# Patient Record
Sex: Male | Born: 1965 | Race: Black or African American | Hispanic: No | Smoking: Never smoker
Health system: Southern US, Community
[De-identification: ages and names within clinical notes are randomized; demographics above are authoritative.]

## PROBLEM LIST (undated history)

## (undated) DIAGNOSIS — E119 Type 2 diabetes mellitus without complications: Secondary | ICD-10-CM

## (undated) DIAGNOSIS — E785 Hyperlipidemia, unspecified: Secondary | ICD-10-CM

## (undated) DIAGNOSIS — D649 Anemia, unspecified: Secondary | ICD-10-CM

## (undated) DIAGNOSIS — G629 Polyneuropathy, unspecified: Secondary | ICD-10-CM

## (undated) DIAGNOSIS — I639 Cerebral infarction, unspecified: Secondary | ICD-10-CM

## (undated) DIAGNOSIS — E11319 Type 2 diabetes mellitus with unspecified diabetic retinopathy without macular edema: Secondary | ICD-10-CM

## (undated) DIAGNOSIS — I10 Essential (primary) hypertension: Secondary | ICD-10-CM

## (undated) DIAGNOSIS — N289 Disorder of kidney and ureter, unspecified: Secondary | ICD-10-CM

## (undated) DIAGNOSIS — Z992 Dependence on renal dialysis: Secondary | ICD-10-CM

## (undated) DIAGNOSIS — K859 Acute pancreatitis without necrosis or infection, unspecified: Secondary | ICD-10-CM

## (undated) DIAGNOSIS — K219 Gastro-esophageal reflux disease without esophagitis: Secondary | ICD-10-CM

## (undated) HISTORY — PX: OTHER SURGICAL HISTORY: SHX169

## (undated) HISTORY — DX: Hyperlipidemia, unspecified: E78.5

---

## 2000-05-15 ENCOUNTER — Encounter: Payer: Self-pay | Admitting: Emergency Medicine

## 2000-05-15 ENCOUNTER — Inpatient Hospital Stay (HOSPITAL_COMMUNITY): Admission: EM | Admit: 2000-05-15 | Discharge: 2000-05-18 | Payer: Self-pay

## 2002-11-19 ENCOUNTER — Other Ambulatory Visit: Payer: Self-pay

## 2002-12-17 ENCOUNTER — Other Ambulatory Visit: Payer: Self-pay

## 2004-06-07 ENCOUNTER — Emergency Department: Payer: Self-pay | Admitting: Emergency Medicine

## 2004-08-08 ENCOUNTER — Emergency Department: Payer: Self-pay | Admitting: Emergency Medicine

## 2004-08-30 ENCOUNTER — Emergency Department: Payer: Self-pay | Admitting: Emergency Medicine

## 2004-09-09 ENCOUNTER — Emergency Department: Payer: Self-pay | Admitting: Unknown Physician Specialty

## 2004-11-14 ENCOUNTER — Emergency Department: Payer: Self-pay | Admitting: Emergency Medicine

## 2004-11-15 ENCOUNTER — Inpatient Hospital Stay: Payer: Self-pay | Admitting: Internal Medicine

## 2004-11-17 ENCOUNTER — Inpatient Hospital Stay: Payer: Self-pay | Admitting: Internal Medicine

## 2005-01-15 ENCOUNTER — Emergency Department: Payer: Self-pay | Admitting: Emergency Medicine

## 2005-01-15 ENCOUNTER — Other Ambulatory Visit: Payer: Self-pay

## 2005-02-04 ENCOUNTER — Inpatient Hospital Stay: Payer: Self-pay | Admitting: Internal Medicine

## 2005-02-28 ENCOUNTER — Emergency Department: Payer: Self-pay | Admitting: Emergency Medicine

## 2005-02-28 ENCOUNTER — Other Ambulatory Visit: Payer: Self-pay

## 2005-04-04 ENCOUNTER — Emergency Department: Payer: Self-pay | Admitting: Emergency Medicine

## 2005-04-20 ENCOUNTER — Emergency Department: Payer: Self-pay | Admitting: Unknown Physician Specialty

## 2005-05-08 ENCOUNTER — Emergency Department: Payer: Self-pay | Admitting: Emergency Medicine

## 2005-05-11 ENCOUNTER — Inpatient Hospital Stay: Payer: Self-pay | Admitting: Internal Medicine

## 2005-06-19 ENCOUNTER — Inpatient Hospital Stay: Payer: Self-pay | Admitting: Internal Medicine

## 2005-06-28 ENCOUNTER — Emergency Department: Payer: Self-pay | Admitting: Internal Medicine

## 2005-07-10 ENCOUNTER — Emergency Department: Payer: Self-pay | Admitting: Unknown Physician Specialty

## 2005-07-10 ENCOUNTER — Other Ambulatory Visit: Payer: Self-pay

## 2005-07-22 ENCOUNTER — Emergency Department: Payer: Self-pay | Admitting: Emergency Medicine

## 2005-07-23 ENCOUNTER — Emergency Department: Payer: Self-pay | Admitting: Emergency Medicine

## 2005-07-23 ENCOUNTER — Other Ambulatory Visit: Payer: Self-pay

## 2005-08-08 ENCOUNTER — Emergency Department: Payer: Self-pay | Admitting: Emergency Medicine

## 2005-08-25 ENCOUNTER — Emergency Department: Payer: Self-pay | Admitting: Emergency Medicine

## 2005-09-01 ENCOUNTER — Emergency Department: Payer: Self-pay | Admitting: Emergency Medicine

## 2005-09-23 ENCOUNTER — Emergency Department: Payer: Self-pay | Admitting: Emergency Medicine

## 2005-10-04 ENCOUNTER — Emergency Department: Payer: Self-pay | Admitting: Emergency Medicine

## 2005-10-04 ENCOUNTER — Ambulatory Visit: Payer: Self-pay | Admitting: Urology

## 2005-10-06 ENCOUNTER — Ambulatory Visit: Payer: Self-pay | Admitting: Urology

## 2005-10-12 ENCOUNTER — Encounter: Payer: Self-pay | Admitting: Family Medicine

## 2005-11-02 ENCOUNTER — Encounter: Payer: Self-pay | Admitting: Family Medicine

## 2005-12-02 ENCOUNTER — Encounter: Payer: Self-pay | Admitting: Family Medicine

## 2006-04-09 ENCOUNTER — Inpatient Hospital Stay: Payer: Self-pay | Admitting: Internal Medicine

## 2006-04-09 ENCOUNTER — Other Ambulatory Visit: Payer: Self-pay

## 2006-05-08 ENCOUNTER — Emergency Department: Payer: Self-pay | Admitting: Emergency Medicine

## 2006-05-08 ENCOUNTER — Other Ambulatory Visit: Payer: Self-pay

## 2006-05-17 ENCOUNTER — Emergency Department: Payer: Self-pay | Admitting: Unknown Physician Specialty

## 2006-05-21 ENCOUNTER — Inpatient Hospital Stay: Payer: Self-pay | Admitting: Internal Medicine

## 2006-05-21 ENCOUNTER — Other Ambulatory Visit: Payer: Self-pay

## 2006-07-18 ENCOUNTER — Emergency Department: Payer: Self-pay | Admitting: Emergency Medicine

## 2006-09-11 ENCOUNTER — Emergency Department: Payer: Self-pay | Admitting: Emergency Medicine

## 2006-11-06 ENCOUNTER — Emergency Department: Payer: Self-pay | Admitting: Emergency Medicine

## 2006-11-13 ENCOUNTER — Emergency Department: Payer: Self-pay | Admitting: Internal Medicine

## 2006-11-26 ENCOUNTER — Encounter: Payer: Self-pay | Admitting: Family Medicine

## 2006-12-03 ENCOUNTER — Encounter: Payer: Self-pay | Admitting: Family Medicine

## 2007-01-21 ENCOUNTER — Emergency Department: Payer: Self-pay | Admitting: Emergency Medicine

## 2007-01-26 ENCOUNTER — Emergency Department: Payer: Self-pay | Admitting: Emergency Medicine

## 2007-03-06 ENCOUNTER — Inpatient Hospital Stay: Payer: Self-pay | Admitting: Internal Medicine

## 2007-05-15 ENCOUNTER — Emergency Department: Payer: Self-pay | Admitting: Emergency Medicine

## 2007-05-15 ENCOUNTER — Other Ambulatory Visit: Payer: Self-pay

## 2007-08-09 ENCOUNTER — Emergency Department: Payer: Self-pay | Admitting: Emergency Medicine

## 2007-08-09 ENCOUNTER — Other Ambulatory Visit: Payer: Self-pay

## 2007-09-04 ENCOUNTER — Inpatient Hospital Stay: Payer: Self-pay | Admitting: Internal Medicine

## 2007-10-15 ENCOUNTER — Inpatient Hospital Stay: Payer: Self-pay | Admitting: *Deleted

## 2007-10-28 ENCOUNTER — Emergency Department: Payer: Self-pay | Admitting: Emergency Medicine

## 2007-12-02 ENCOUNTER — Inpatient Hospital Stay: Payer: Self-pay | Admitting: Internal Medicine

## 2008-05-07 ENCOUNTER — Ambulatory Visit: Payer: Self-pay | Admitting: Ophthalmology

## 2008-05-20 ENCOUNTER — Ambulatory Visit: Payer: Self-pay | Admitting: Ophthalmology

## 2008-12-01 ENCOUNTER — Inpatient Hospital Stay: Payer: Self-pay | Admitting: Internal Medicine

## 2008-12-11 ENCOUNTER — Inpatient Hospital Stay: Payer: Self-pay | Admitting: Internal Medicine

## 2008-12-31 ENCOUNTER — Encounter: Payer: Self-pay | Admitting: Internal Medicine

## 2009-01-02 ENCOUNTER — Encounter: Payer: Self-pay | Admitting: Internal Medicine

## 2009-01-05 ENCOUNTER — Emergency Department: Payer: Self-pay | Admitting: Unknown Physician Specialty

## 2009-02-02 ENCOUNTER — Encounter: Payer: Self-pay | Admitting: Internal Medicine

## 2009-03-02 ENCOUNTER — Encounter: Payer: Self-pay | Admitting: Internal Medicine

## 2009-04-24 ENCOUNTER — Inpatient Hospital Stay: Payer: Self-pay | Admitting: Internal Medicine

## 2009-06-28 ENCOUNTER — Emergency Department: Payer: Self-pay | Admitting: Unknown Physician Specialty

## 2009-08-07 ENCOUNTER — Inpatient Hospital Stay: Payer: Self-pay | Admitting: Internal Medicine

## 2009-08-12 ENCOUNTER — Inpatient Hospital Stay: Payer: Self-pay | Admitting: Internal Medicine

## 2009-12-13 ENCOUNTER — Inpatient Hospital Stay: Payer: Self-pay | Admitting: Internal Medicine

## 2010-01-06 ENCOUNTER — Ambulatory Visit: Payer: Self-pay | Admitting: Internal Medicine

## 2010-01-12 ENCOUNTER — Ambulatory Visit: Payer: Self-pay | Admitting: Internal Medicine

## 2010-01-12 ENCOUNTER — Inpatient Hospital Stay: Payer: Self-pay | Admitting: Internal Medicine

## 2010-01-26 ENCOUNTER — Ambulatory Visit: Payer: Self-pay | Admitting: Vascular Surgery

## 2010-02-02 ENCOUNTER — Ambulatory Visit: Payer: Self-pay | Admitting: Vascular Surgery

## 2010-02-18 ENCOUNTER — Inpatient Hospital Stay: Payer: Self-pay | Admitting: Internal Medicine

## 2010-03-15 ENCOUNTER — Emergency Department: Payer: Self-pay | Admitting: Unknown Physician Specialty

## 2010-03-18 ENCOUNTER — Ambulatory Visit: Payer: Self-pay

## 2010-04-18 ENCOUNTER — Ambulatory Visit: Payer: Self-pay | Admitting: Internal Medicine

## 2010-04-23 ENCOUNTER — Inpatient Hospital Stay: Payer: Self-pay | Admitting: Internal Medicine

## 2010-05-31 ENCOUNTER — Ambulatory Visit: Payer: Self-pay | Admitting: Vascular Surgery

## 2010-08-17 ENCOUNTER — Inpatient Hospital Stay: Payer: Self-pay | Admitting: Specialist

## 2011-01-18 ENCOUNTER — Ambulatory Visit: Payer: Self-pay | Admitting: Vascular Surgery

## 2011-01-18 LAB — POTASSIUM: Potassium: 3.3 mmol/L — ABNORMAL LOW (ref 3.5–5.1)

## 2011-01-18 LAB — GLUCOSE, RANDOM
Glucose: 544 mg/dL (ref 65–99)
Glucose: 485 mg/dL — ABNORMAL HIGH (ref 65–99)

## 2011-12-13 ENCOUNTER — Ambulatory Visit: Payer: Self-pay | Admitting: Vascular Surgery

## 2012-05-24 ENCOUNTER — Observation Stay: Payer: Self-pay | Admitting: Internal Medicine

## 2012-05-24 LAB — CBC WITH DIFFERENTIAL/PLATELET
Basophil #: 0.1 10*3/uL (ref 0.0–0.1)
Basophil %: 0.9 %
Eosinophil #: 0.3 10*3/uL (ref 0.0–0.7)
Eosinophil %: 2.9 %
HCT: 36.7 % — ABNORMAL LOW (ref 40.0–52.0)
HGB: 12 g/dL — ABNORMAL LOW (ref 13.0–18.0)
Lymphocyte #: 2.8 10*3/uL (ref 1.0–3.6)
Lymphocyte %: 31.5 %
MCH: 32.5 pg (ref 26.0–34.0)
MCHC: 32.8 g/dL (ref 32.0–36.0)
MCV: 99 fL (ref 80–100)
Monocyte #: 0.6 x10 3/mm (ref 0.2–1.0)
Monocyte %: 6.7 %
Neutrophil #: 5.1 10*3/uL (ref 1.4–6.5)
Neutrophil %: 58 %
Platelet: 211 10*3/uL (ref 150–440)
RBC: 3.7 10*6/uL — ABNORMAL LOW (ref 4.40–5.90)
RDW: 14.8 % — ABNORMAL HIGH (ref 11.5–14.5)
WBC: 8.8 10*3/uL (ref 3.8–10.6)

## 2012-05-24 LAB — CK TOTAL AND CKMB (NOT AT ARMC)
CK, Total: 503 U/L — ABNORMAL HIGH (ref 35–232)
CK, Total: 516 U/L — ABNORMAL HIGH (ref 35–232)
CK-MB: 2.6 ng/mL (ref 0.5–3.6)
CK-MB: 3.1 ng/mL (ref 0.5–3.6)

## 2012-05-24 LAB — COMPREHENSIVE METABOLIC PANEL
Albumin: 3.8 g/dL (ref 3.4–5.0)
Alkaline Phosphatase: 127 U/L (ref 50–136)
Anion Gap: 8 (ref 7–16)
BUN: 12 mg/dL (ref 7–18)
Bilirubin,Total: 1 mg/dL (ref 0.2–1.0)
Calcium, Total: 9.9 mg/dL (ref 8.5–10.1)
Chloride: 99 mmol/L (ref 98–107)
Co2: 29 mmol/L (ref 21–32)
Creatinine: 8.35 mg/dL — ABNORMAL HIGH (ref 0.60–1.30)
EGFR (African American): 8 — ABNORMAL LOW
EGFR (Non-African Amer.): 7 — ABNORMAL LOW
Glucose: 166 mg/dL — ABNORMAL HIGH (ref 65–99)
Osmolality: 275 (ref 275–301)
Potassium: 4 mmol/L (ref 3.5–5.1)
SGOT(AST): 24 U/L (ref 15–37)
SGPT (ALT): 33 U/L (ref 12–78)
Sodium: 136 mmol/L (ref 136–145)
Total Protein: 8.8 g/dL — ABNORMAL HIGH (ref 6.4–8.2)

## 2012-05-24 LAB — TROPONIN I
Troponin-I: <0.02 ng/mL
Troponin-I: <0.02 ng/mL

## 2012-05-24 LAB — TSH: Thyroid Stimulating Horm: 0.69 u[IU]/mL

## 2012-05-25 LAB — BASIC METABOLIC PANEL
Anion Gap: 10 (ref 7–16)
BUN: 22 mg/dL — ABNORMAL HIGH (ref 7–18)
Calcium, Total: 9.6 mg/dL (ref 8.5–10.1)
Chloride: 100 mmol/L (ref 98–107)
Co2: 27 mmol/L (ref 21–32)
EGFR (African American): 7 — ABNORMAL LOW
EGFR (Non-African Amer.): 6 — ABNORMAL LOW
Glucose: 120 mg/dL — ABNORMAL HIGH (ref 65–99)
Osmolality: 278 (ref 275–301)
Potassium: 3.5 mmol/L (ref 3.5–5.1)
Sodium: 137 mmol/L (ref 136–145)

## 2012-05-25 LAB — CK TOTAL AND CKMB (NOT AT ARMC)
CK, Total: 369 U/L — ABNORMAL HIGH (ref 35–232)
CK-MB: 2.3 ng/mL (ref 0.5–3.6)

## 2012-05-25 LAB — CBC WITH DIFFERENTIAL/PLATELET
Basophil #: 0.1 10*3/uL (ref 0.0–0.1)
HCT: 34.8 % — ABNORMAL LOW (ref 40.0–52.0)
HGB: 11.7 g/dL — ABNORMAL LOW (ref 13.0–18.0)
Lymphocyte #: 3.6 10*3/uL (ref 1.0–3.6)
Lymphocyte %: 32.8 %
MCH: 33.2 pg (ref 26.0–34.0)
Monocyte #: 0.7 x10 3/mm (ref 0.2–1.0)
Monocyte %: 6.5 %
Neutrophil %: 57 %
Platelet: 198 10*3/uL (ref 150–440)
RBC: 3.52 10*6/uL — ABNORMAL LOW (ref 4.40–5.90)
RDW: 15 % — ABNORMAL HIGH (ref 11.5–14.5)
WBC: 11.1 10*3/uL — ABNORMAL HIGH (ref 3.8–10.6)

## 2012-05-25 LAB — PHOSPHORUS: Phosphorus: 2.3 mg/dL — ABNORMAL LOW (ref 2.5–4.9)

## 2012-05-25 LAB — LIPID PANEL
Cholesterol: 172 mg/dL (ref 0–200)
Ldl Cholesterol, Calc: 93 mg/dL (ref 0–100)
Triglycerides: 195 mg/dL (ref 0–200)

## 2012-05-25 LAB — TROPONIN I: Troponin-I: <0.02 ng/mL

## 2012-07-15 ENCOUNTER — Ambulatory Visit: Payer: Self-pay | Admitting: Nephrology

## 2013-03-07 ENCOUNTER — Inpatient Hospital Stay: Payer: Self-pay | Admitting: Internal Medicine

## 2013-03-07 LAB — CBC WITH DIFFERENTIAL/PLATELET
Basophil #: 0 10*3/uL (ref 0.0–0.1)
Basophil %: 0.4 %
EOS ABS: 0.1 10*3/uL (ref 0.0–0.7)
Eosinophil %: 0.6 %
HCT: 35.5 % — ABNORMAL LOW (ref 40.0–52.0)
HGB: 11.5 g/dL — AB (ref 13.0–18.0)
LYMPHS PCT: 7.5 %
Lymphocyte #: 0.7 10*3/uL — ABNORMAL LOW (ref 1.0–3.6)
MCH: 31.7 pg (ref 26.0–34.0)
MCHC: 32.3 g/dL (ref 32.0–36.0)
MCV: 98 fL (ref 80–100)
MONO ABS: 0.1 x10 3/mm — AB (ref 0.2–1.0)
MONOS PCT: 0.8 %
NEUTROS ABS: 8.8 10*3/uL — AB (ref 1.4–6.5)
Neutrophil %: 90.7 %
PLATELETS: 230 10*3/uL (ref 150–440)
RBC: 3.61 10*6/uL — AB (ref 4.40–5.90)
RDW: 14.6 % — ABNORMAL HIGH (ref 11.5–14.5)
WBC: 9.7 10*3/uL (ref 3.8–10.6)

## 2013-03-07 LAB — URINALYSIS, COMPLETE
BILIRUBIN, UR: NEGATIVE
KETONE: NEGATIVE
Nitrite: NEGATIVE
Ph: 8 (ref 4.5–8.0)
Protein: 100
RBC,UR: 8 /HPF (ref 0–5)
SPECIFIC GRAVITY: 1.012 (ref 1.003–1.030)
WBC UR: 140 /HPF (ref 0–5)

## 2013-03-07 LAB — BASIC METABOLIC PANEL
Anion Gap: 8 (ref 7–16)
BUN: 21 mg/dL — ABNORMAL HIGH (ref 7–18)
CREATININE: 7.16 mg/dL — AB (ref 0.60–1.30)
Calcium, Total: 8.9 mg/dL (ref 8.5–10.1)
Chloride: 96 mmol/L — ABNORMAL LOW (ref 98–107)
Co2: 29 mmol/L (ref 21–32)
EGFR (African American): 9 — ABNORMAL LOW
EGFR (Non-African Amer.): 8 — ABNORMAL LOW
Glucose: 393 mg/dL — ABNORMAL HIGH (ref 65–99)
OSMOLALITY: 286 (ref 275–301)
Potassium: 4.6 mmol/L (ref 3.5–5.1)
SODIUM: 133 mmol/L — AB (ref 136–145)

## 2013-03-07 LAB — DRUG SCREEN, URINE
AMPHETAMINES, UR SCREEN: NEGATIVE (ref ?–1000)
Barbiturates, Ur Screen: NEGATIVE (ref ?–200)
Benzodiazepine, Ur Scrn: NEGATIVE (ref ?–200)
COCAINE METABOLITE, UR ~~LOC~~: NEGATIVE (ref ?–300)
Cannabinoid 50 Ng, Ur ~~LOC~~: NEGATIVE (ref ?–50)
MDMA (ECSTASY) UR SCREEN: NEGATIVE (ref ?–500)
Methadone, Ur Screen: NEGATIVE (ref ?–300)
Opiate, Ur Screen: NEGATIVE (ref ?–300)
PHENCYCLIDINE (PCP) UR S: NEGATIVE (ref ?–25)
TRICYCLIC, UR SCREEN: NEGATIVE (ref ?–1000)

## 2013-03-07 LAB — TROPONIN I: Troponin-I: <0.02 ng/mL

## 2013-03-08 LAB — COMPREHENSIVE METABOLIC PANEL
ANION GAP: 9 (ref 7–16)
Albumin: 3.1 g/dL — ABNORMAL LOW (ref 3.4–5.0)
Alkaline Phosphatase: 127 U/L — ABNORMAL HIGH
BUN: 37 mg/dL — ABNORMAL HIGH (ref 7–18)
Bilirubin,Total: 0.4 mg/dL (ref 0.2–1.0)
CO2: 29 mmol/L (ref 21–32)
Calcium, Total: 8.7 mg/dL (ref 8.5–10.1)
Chloride: 97 mmol/L — ABNORMAL LOW (ref 98–107)
Creatinine: 9.09 mg/dL — ABNORMAL HIGH (ref 0.60–1.30)
EGFR (African American): 7 — ABNORMAL LOW
EGFR (Non-African Amer.): 6 — ABNORMAL LOW
Glucose: 228 mg/dL — ABNORMAL HIGH (ref 65–99)
Osmolality: 286 (ref 275–301)
Potassium: 4.1 mmol/L (ref 3.5–5.1)
SGOT(AST): 19 U/L (ref 15–37)
SGPT (ALT): 17 U/L (ref 12–78)
Sodium: 135 mmol/L — ABNORMAL LOW (ref 136–145)
Total Protein: 7.9 g/dL (ref 6.4–8.2)

## 2013-03-08 LAB — CBC WITH DIFFERENTIAL/PLATELET
Basophil #: 0 10*3/uL (ref 0.0–0.1)
Basophil #: 0.2 10*3/uL — ABNORMAL HIGH (ref 0.0–0.1)
Basophil %: 0.3 %
Basophil %: 1 %
EOS PCT: 0 %
Eosinophil #: 0 10*3/uL (ref 0.0–0.7)
Eosinophil #: 0 10*3/uL (ref 0.0–0.7)
Eosinophil %: 0.3 %
HCT: 34.1 % — ABNORMAL LOW (ref 40.0–52.0)
HCT: 32.9 % — ABNORMAL LOW (ref 40.0–52.0)
HGB: 11.1 g/dL — AB (ref 13.0–18.0)
HGB: 11.1 g/dL — ABNORMAL LOW (ref 13.0–18.0)
LYMPHS PCT: 13.2 %
Lymphocyte #: 2.2 10*3/uL (ref 1.0–3.6)
Lymphocyte #: 3 10*3/uL (ref 1.0–3.6)
Lymphocyte %: 19.2 %
MCH: 31.7 pg (ref 26.0–34.0)
MCH: 33 pg (ref 26.0–34.0)
MCHC: 32.4 g/dL (ref 32.0–36.0)
MCHC: 33.7 g/dL (ref 32.0–36.0)
MCV: 98 fL (ref 80–100)
MCV: 98 fL (ref 80–100)
MONO ABS: 0.7 x10 3/mm (ref 0.2–1.0)
MONOS PCT: 4.5 %
Monocyte #: 1 x10 3/mm (ref 0.2–1.0)
Monocyte %: 6 %
NEUTROS ABS: 13.6 10*3/uL — AB (ref 1.4–6.5)
NEUTROS PCT: 75 %
Neutrophil #: 11.6 10*3/uL — ABNORMAL HIGH (ref 1.4–6.5)
Neutrophil %: 80.5 %
Platelet: 240 10*3/uL (ref 150–440)
Platelet: 228 10*3/uL (ref 150–440)
RBC: 3.48 10*6/uL — ABNORMAL LOW (ref 4.40–5.90)
RBC: 3.36 10*6/uL — AB (ref 4.40–5.90)
RDW: 14.6 % — ABNORMAL HIGH (ref 11.5–14.5)
RDW: 14.5 % (ref 11.5–14.5)
WBC: 16.9 10*3/uL — AB (ref 3.8–10.6)
WBC: 15.5 10*3/uL — ABNORMAL HIGH (ref 3.8–10.6)

## 2013-03-08 LAB — LIPID PANEL
Cholesterol: 267 mg/dL — ABNORMAL HIGH (ref 0–200)
HDL Cholesterol: 51 mg/dL (ref 40–60)
Ldl Cholesterol, Calc: 182 mg/dL — ABNORMAL HIGH (ref 0–100)
TRIGLYCERIDES: 171 mg/dL (ref 0–200)
VLDL Cholesterol, Calc: 34 mg/dL (ref 5–40)

## 2013-03-08 LAB — MAGNESIUM: Magnesium: 1.7 mg/dL — ABNORMAL LOW

## 2013-03-09 LAB — MAGNESIUM: MAGNESIUM: 1.7 mg/dL — AB

## 2013-03-09 LAB — POTASSIUM: POTASSIUM: 4 mmol/L (ref 3.5–5.1)

## 2013-05-13 ENCOUNTER — Emergency Department: Payer: Self-pay | Admitting: Emergency Medicine

## 2013-05-13 LAB — BASIC METABOLIC PANEL
ANION GAP: 8 (ref 7–16)
BUN: 33 mg/dL — ABNORMAL HIGH (ref 7–18)
CALCIUM: 9.1 mg/dL (ref 8.5–10.1)
Chloride: 103 mmol/L (ref 98–107)
Co2: 28 mmol/L (ref 21–32)
Creatinine: 9.79 mg/dL — ABNORMAL HIGH (ref 0.60–1.30)
EGFR (African American): 6 — ABNORMAL LOW
EGFR (Non-African Amer.): 6 — ABNORMAL LOW
Glucose: 209 mg/dL — ABNORMAL HIGH (ref 65–99)
Osmolality: 291 (ref 275–301)
POTASSIUM: 4.4 mmol/L (ref 3.5–5.1)
SODIUM: 139 mmol/L (ref 136–145)

## 2013-05-13 LAB — TROPONIN I

## 2013-05-13 LAB — CBC
HCT: 33.5 % — AB (ref 40.0–52.0)
HGB: 10.9 g/dL — ABNORMAL LOW (ref 13.0–18.0)
MCH: 31.8 pg (ref 26.0–34.0)
MCHC: 32.5 g/dL (ref 32.0–36.0)
MCV: 98 fL (ref 80–100)
Platelet: 207 10*3/uL (ref 150–440)
RBC: 3.42 10*6/uL — AB (ref 4.40–5.90)
RDW: 13.9 % (ref 11.5–14.5)
WBC: 8 10*3/uL (ref 3.8–10.6)

## 2013-11-10 ENCOUNTER — Emergency Department: Payer: Self-pay | Admitting: Emergency Medicine

## 2013-11-10 LAB — TROPONIN I: TROPONIN-I: 0.03 ng/mL

## 2014-04-14 ENCOUNTER — Inpatient Hospital Stay
Admit: 2014-04-14 | Disposition: A | Discharge status: Home / Self Care | Payer: Self-pay | Attending: Internal Medicine | Admitting: Internal Medicine

## 2014-04-14 LAB — PHOSPHORUS: Phosphorus: 6.3 mg/dL — ABNORMAL HIGH

## 2014-04-14 LAB — TROPONIN I
Troponin-I: 0.07 ng/mL — ABNORMAL HIGH
Troponin-I: 0.07 ng/mL — ABNORMAL HIGH
Troponin-I: 0.06 ng/mL — ABNORMAL HIGH

## 2014-04-14 LAB — BASIC METABOLIC PANEL
ANION GAP: 12 (ref 7–16)
BUN: 49 mg/dL — ABNORMAL HIGH
CALCIUM: 9.6 mg/dL
Chloride: 101 mmol/L
Co2: 28 mmol/L
Creatinine: 14.42 mg/dL — ABNORMAL HIGH
GFR CALC AF AMER: 4 — AB
GFR CALC NON AF AMER: 3 — AB
Glucose: 186 mg/dL — ABNORMAL HIGH
Potassium: 3.6 mmol/L
Sodium: 141 mmol/L

## 2014-04-14 LAB — CBC
HCT: 34.8 % — ABNORMAL LOW (ref 40.0–52.0)
HGB: 11.1 g/dL — ABNORMAL LOW (ref 13.0–18.0)
MCH: 31 pg (ref 26.0–34.0)
MCHC: 32 g/dL (ref 32.0–36.0)
MCV: 97 fL (ref 80–100)
PLATELETS: 207 10*3/uL (ref 150–440)
RBC: 3.59 10*6/uL — ABNORMAL LOW (ref 4.40–5.90)
RDW: 15.9 % — AB (ref 11.5–14.5)
WBC: 8.6 10*3/uL (ref 3.8–10.6)

## 2014-04-24 NOTE — H&P (Signed)
PATIENT NAME:  Devin Richmond, MCNELLY MR#:  [id number] DATE OF BIRTH:  10/24/1965  DATE OF ADMISSION:  05/24/2012  PRIMARY CARE DOCTOR: Dr. Maryellen Pile.   ED REFERRING DOCTOR: Dr. Dorothea Glassman.  NEPHROLOGIST: Dr. Cherylann Ratel.   CHIEF COMPLAINT: Shortness of breath.   HISTORY OF PRESENT ILLNESS: The patient is a 49 year old African American male with a history of hypertension and diabetes who is on hemodialysis for end-stage renal disease. Has a history of cardiomyopathy, with an EF of 40% to 50%, who presents with complaint of having shortness of breath for the last 2 days. The patient has been shortness of breath with activity as well as rest. He was dialyzed yesterday, and he stated that he was below his dry weight. He has not had any lower extremity swelling. He denies any problems with shortness of breath with laying flat. He has not had any fevers. No coughing. No wheezing. He denies any chest pain. His main complaint is shortness of breath. He otherwise denies any abdominal pain, nausea. He denies any nausea, vomiting or diarrhea. Denies any calf swelling.   PAST MEDICAL HISTORY: 1.  History of CVAs x 3, according to the patient. He has an unstable gait.  2.  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  3.  Hypertension.  4.  Hyperlipidemia.  5.  History of gastroparesis.  6.  History of neuropathy.  7.  History of right shoulder fracture.  8.  Cardiomyopathy, with an EF of 40% to 50%.  9.  History of pancreatitis.   ALLERGIES: SULFA.   CURRENT MEDICATIONS: He is on Aggrenox 1 tablet p.o. b.i.d., amlodipine 10 daily, calcium acetate 2 capsules 3 times a day, Dialyvite 1 tablet p.o. daily, folic acid 0.4 daily, glimepiride 2 mg daily, hydralazine 50 one tab p.o. t.i.d., labetalol 200 one tab p.o. b.i.d., Lipitor  three caps 3 times, Sensipar 90 mg 1 tab p.o. daily.   SOCIAL HISTORY: No smoking. No drinking. No drugs. The patient lives with his mother. History of cocaine use.   FAMILY HISTORY:  Significant for diabetes and hypertension. Father had a history of a CVA.   REVIEW OF SYSTEMS:  CONSTITUTIONAL: Denies any fevers. Complains of some fatigue, weakness. No pain. No weight loss. No weight gain.  EYES: No blurred or double vision in the right eye. Left eye, he is blind. Denies any glaucoma.  EARS/NOSE/THROAT: No tinnitus. No ear pain. No hearing loss. No seasonal allergies. No epistaxis. No difficulty swallowing.  RESPIRATORY: Denies any cough, wheezing. No hemoptysis. Complains of dyspnea. No asthma. No painful respiration. No COPD. No TB.  CARDIOVASCULAR: Denies any chest pain, orthopnea, edema or arrhythmia.  GASTROINTESTINAL: No nausea, vomiting, diarrhea. No abdominal pain. No hematemesis. No melena.  GENITOURINARY: Denies any dysuria, hematuria, renal calc or frequency.  ENDOCRINE: Denies any polyuria, nocturia or thyroid problems.  HEMATOLOGIC/LYMPHATIC: Denies any major bruisability or bleeding.  SKIN: No acne. No rash. No changes in mole, hair or skin.  MUSCULOSKELETAL: Denies any pain in the neck, back or shoulder.  NEUROLOGIC: Has a history of CVA. Has unsteady gait. No seizures.  PSYCHIATRIC: No anxiety. No insomnia. No ADD. No OCD.   PHYSICAL EXAMINATION: VITAL SIGNS: Temperature 97.4, pulse 74, respirations 22, blood pressure 134/81, O2 of 98%.  GENERAL: The patient is a well-developed [place] American male in no acute distress.  HEAD: Normocephalic, atraumatic.  EYES: Left eye is close to being shut, which is chronic, and he has blindness in that eye. Right eye pupil is  equally round and reactive. No conjunctival pallor. Extraocular movements intact. NOSE: There is no nasal drainage or mass.  EARS: No erythema or drainage.  MOUTH: There is no exudate. No lesions.  NECK: Supple and symmetric. No masses. Thyroid midline, not enlarged. No JVD. Neck is nontender.  RESPIRATORY: Clear to auscultation, without any accessory muscle usage.  CARDIOVASCULAR: Regular rate  and rhythm. No murmurs, gallops, clicks, heaves or rubs.  GASTROINTESTINAL: There is no tenderness. No mass. No hepatosplenomegaly. Bowel sounds x4 are present.  GENITOURINARY: Deferred.  MUSCULOSKELETAL: There is no erythema or swelling. No gout.  SKIN: There is no rash.  LYMPHATICS: No lymph nodes palpable.  VASCULAR: Good DP, PT pulses.  NEUROLOGICAL: Cranial nerves II-XII grossly intact. Strength is 5/5 in all 4 extremities.  PSYCHIATRIC: Not anxious or depressed.   EVALUATION IN THE ED: EKG: Normal sinus rhythm without any ST-T wave changes. PA and lateral chest x-ray is normal. WBC 8.8, hemoglobin 12, platelet count 211. BMP: Glucose 166, BUN 12, creatinine 8.35, sodium 136, potassium 4.0, chloride 99, CO2 is 29, calcium is 9.9. LFTs are normal except total albumin of 8.8. Troponin less than 0.02. TSH 0.69.   ASSESSMENT AND PLAN: The patient is a 49 year old African American male with end-stage renal disease on hemodialysis, history of cerebrovascular accident, presents with shortness of breath for 2 days. Workup so far has been negative.   1.  Shortness of breath: No hypoxia noted on his oxygenation saturation, but he is short of breath according to him and his mom. We will go ahead and do a 2-D echocardiogram of his heart. We will go ahead and get a CT per pulmonary embolism protocol since he is already on dialysis. To rule out pulmonary embolism, I will ask Cardiology to see the patient. This could be anginal equivalent as well. Check serial cardiac enzymes; he may need further workup.  2. End-stage renal disease: Hemodialysis yesterday. I will have Nephrology to come see the patient.  3.  Diabetes: The patient on some sort of insulin; he is not sure what; his mom is to bring the insulin in, so we will resume him on insulin once that dose is available and what insulin he is on.  4. Hypertension: We will continue his regimen as taking at home, which includes labetalol, hydralazine.  5.   History of cerebrovascular accident: Continue Aggrenox as taking previously.  6.  Miscellaneous: Will put him on heparin for deep venous thrombosis prophylaxis.   NOTE: 40 minutes spent on H and P.     ____________________________ Lacie ScottsShreyang H. Allena KatzPatel, MD shp:dm D: 05/24/2012 11:34:00 ET T: 05/24/2012 11:53:38 ET JOB#: [id number]  cc: Lleyton Byers H. Allena KatzPatel, MD, <Dictator> Charise CarwinSHREYANG H Diesel Lina MD ELECTRONICALLY SIGNED 05/30/2012 14:52

## 2014-04-24 NOTE — Consult Note (Signed)
PATIENT NAME:  Gevena BarreSMITH, Kasch C MR#:  [id number] DATE OF BIRTH:  06/30/1965  DATE OF CONSULTATION:  05/24/2012  CONSULTING PHYSICIAN:  Corky DownsJaved Tobyn Osgood, MD  HISTORY OF PRESENT ILLNESS: Weston Brassric Dimercurio was admitted into the hospital with shortness of breath, trouble breathing. The patient is known to have chronic renal failure, on dialysis with a shunt in the right arm and has been getting dialysis 3 times a week. His renal failure is secondary to hypertension, and his strokes are also secondary to elevated blood pressure. The patient is also diabetic and on insulin. Echocardiogram in the past has revealed ejection fraction of 40% to 50%. Recently he complained of swelling of the extremities. Now today he is doing well, back to his baseline status. Denies any chest pain, nausea, vomiting, sweating. Denies any history of chest pain on exertion. There is no history of paroxysmal nocturnal dyspnea or orthopnea. There is no swelling of the legs. Denies any abdominal pain, fever or chills.   PAST MEDICAL HISTORY:  1.  Blindness in left eye due to diabetes. 2.  Hypertension.  3.  Dyslipidemia.  4.  Neuropathy.  5.  History of pancreatitis.   ALLERGIES: SULFA. No other known allergies.   PRESENT MEDICATIONS: As per chart.   REVIEW OF SYSTEMS: The patient has residual effect of stroke with trouble speaking. Otherwise, he was watching TV very well and denied any history of any musculoskeletal complaints.   LABORATORY, DIAGNOSTIC AND RADIOLOGICAL DATA: Electrocardiogram revealed sinus tachycardia. Chest x-ray does not show any congestive heart failure. Liver functions are normal. Troponin is normal. CPK is normal. TSH 0.69.   IMPRESSION AND RECOMMENDATIONS: The patient was evaluated for congestive heart failure and fluid overload problem. I think it is  secondary to chronic renal failure. He is not having any chest pain. His EKG does not show any acute changes. No arrhythmia is noted. I do not see any need for any  further invasive testing at the present time. Suggest to continue hemodialysis for his renal failure. Keep blood pressure under control (has elevated blood pressure) because that caused most of his problem in the past.   ____________________________ Corky DownsJaved Keontay Vora, MD jm:jm D: 05/24/2012 20:39:55 ET T: 05/24/2012 21:26:39 ET JOB#: [id number]  cc: Corky DownsJaved Obdulio Mash, MD, <Dictator> Corky DownsJAVED Annastyn Silvey MD ELECTRONICALLY SIGNED 06/17/2012 13:06

## 2014-04-24 NOTE — Discharge Summary (Signed)
PATIENT NAME:  Gevena BarreSMITH, Kazumi C MR#:  [id number] DATE OF BIRTH:  [birth year]  DATE OF ADMISSION:  05/24/2012  DATE OF DISCHARGE:  05/26/2012  ADMITTING DIAGNOSIS:  Shortness of breath.   DISCHARGE DIAGNOSES: 1.  Shortness of breath of unclear cause, now resolved, possibly due to fluid overload. The patient's CT per PE protocol was negative. Echocardiogram done, results are currently pending. The patient is currently asymptomatic.  2.  Hypotension noted during hospitalization. His blood pressure medications have been held. Blood pressure is now normal.  3.  Hypoglycemia. The patient's insulin also was held, and his blood sugars are improved. He will need to keep a log of his blood sugar, and see his primary care provider next week.  4.  End-stage renal disease.  5.  Previous history of cerebrovascular accident on Aggrenox.  6.  History of dilated cardiomyopathy, seen by Cardiology.  7.  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  8.  Hyperlipidemia.  9.  History of gastroparesis.  10.  History of neuropathy.  11.  History of right shoulder fracture in the past.  12.  History of pancreatitis in the past.   CONSULTANTS:  Dr. Edmonia JamesHameet Singh   PERTINENT LABS AND EVALUATIONS:  His CPK was elevated at 503, CK-MB was 2.6. Troponin less than 0.02. Glucose was 166, BUN 12, creatinine 8.35, sodium 136, potassium 4.0, chloride 99, CO2 was 29, calcium 9.9. LFTs showed a total protein of 8.8. WBC 8.8, hemoglobin 12, platelet count was 211. CT per PE protocol showed no CT evidence of PE, no evidence of CHF, no other pulmonary parenchymal abnormality. His EKG showed normal sinus rhythm without any ST-T wave changes. Echo result pending.   HOSPITAL COURSE:  Please refer to H and P done by me on admission. The patient is a 49 year old African-American male who presented with shortness of breath. His evaluation in the ED, including chest x-ray, was negative. The patient did not have any chest pain. He was placed  under observation for further evaluation. He had a CT per PE protocol, which failed to show any evidence of PE or any other lung parenchymal abnormality. He was seen by Cardiology. They reviewed his EKG and symptoms, and did not feel that this was related to his heart. He did have an echocardiogram, the results are currently pending. The plan was for him to be discharged next day; however, next day he was noted to be hypotensive and hypoglycemic. His antihypertensives and diabetic medications were held. With these treatments, his blood sugars improved, and his blood pressure has normalized. Currently he is doing well and is anxious to go home. He is stable for discharge. He will need to keep a log of his blood pressure and his blood glucose to take to his primary care provider next week so some of his medications can be restarted.   DISCHARGE MEDICATIONS:  Dialyvite 1 tab p.o. daily, Aggrenox 1 tab p.o. b.i.d., Lipitor 80 daily, Renvela 800 mg 3 tabs 3 times a day, ranitidine 150,  1tab p.o. b.i.d., meclizine 25, 4 times a day as needed, Sensipar 90 daily, calcium acetate 667 mg, 2 caps 3 times a day, folic acid 0.4 daily, insulin  10 units subcu at bedtime.   DIET:  Carbohydrate controlled renal diet.   ACTIVITY:  As tolerated.    FOLLOW UP:  With Dr. Maryellen PileEason next week. Hemodialysis as previously. Patient recommended to keep a log of his blood pressure and blood glucose to take to Dr. Maryellen PileEason.  Note:  32 minutes spent on the discharge.    ____________________________ Lacie Scotts. Allena Katz, MD shp:mr D: 05/26/2012 12:37:00 ET T: 05/26/2012 19:11:50 ET JOB#: [id number]  cc: Areliz Rothman H. Allena Katz, MD, <Dictator> Charise Carwin MD ELECTRONICALLY SIGNED 05/30/2012 14:53

## 2014-04-25 NOTE — H&P (Signed)
PATIENT NAME:  Devin Richmond, Devin Richmond MR#:  [id number] DATE OF BIRTH:  30-Jun-1965  DATE OF ADMISSION:  03/07/2013  PRIMARY CARE PHYSICIAN:  Dr. Toy CookeyErnest Eason   REFERRING EMERGENCY ROOM PHYSICIAN: Dr. Manson PasseyBrown  CHIEF COMPLAINT:  Syncope.   HISTORY OF PRESENT ILLNESS: The patient is a 49 year old African American male with past medical history of diabetes mellitus, history of 3 strokes, end-stage renal disease on hemodialysis on Tuesday, Thursday and Saturday, hypertension, hyperlipidemia, left eye blindness as a result of diabetes, cardiomyopathy with an ejection fraction of 40% to 50%, history of neuropathy, gastroparesis. He was brought into the ER after he sustained a syncopal episode. The patient is reporting that at around 3:30 a.m., he got out of the bed to go to bathroom. He was feeling fine and suddenly he dropped down to the floor and hit his head against the wall.  He was unconscious for a few seconds. His mom immediately saw him and called EMS. He regained consciousness spontaneously. The patient was brought into the ER via EMS. CT head was normal. He is complaining of headache. Has chronic left eye blindness. He had a similar episode of syncope several years ago and etiology was unclear at that time also. The patient has reported that his last dialysis was done on Thursday. He was evaluated with Dr. Juliann Paresallwood in the past because his blood pressure was dropping down on dialysis.  During my examination, he denies any chest pain or shortness of breath. He denies any kind of aura prior to his syncopal episode. Denies any chest pain, shortness of breath, dizziness, nausea, vomiting, diarrhea, abdominal pain. His Accu-Chek was high at around 450s in the ER.   PAST MEDICAL HISTORY: Hypertension, hyperlipidemia, diabetes mellitus, gastroparesis and neuropathy related to diabetes mellitus, history of left eye blindness from diabetes, cardiomyopathy with ejection fraction of 45% to 50%, history of 3 strokes in the  past, unstable gait, end-stage renal disease on hemodialysis on Tuesday, Thursday and Saturday.    PAST SURGICAL HISTORY:  He has AV fistula in the right forearm for dialysis.   ALLERGIES:  RADIOCONTRAST DYE, SULFA DRUGS  PSYCHOSOCIAL HISTORY: Lives with mom. History of cocaine use in the past.  No history of alcohol or illicit drug use.   FAMILY HISTORY: Diabetes mellitus and hypertension run in his family. Father had history of stroke.   HOME MEDICATIONS: Sensipar 90 mg once daily, ranitidine 150 mg 2 times a day,  mirtazapine 30 mg 1 tablet p.o. once daily, losartan 25 mg once daily, Lipitor 80 mg once daily, insulin 10 units subcutaneous once daily, calcium acetate 2 capsules p.o. 3 times a day, Aggrenox 1 capsule p.o. 2 times a day.   REVIEW OF SYSTEMS:   CONSTITUTIONAL: Denies any fever or fatigue. EYES:  Denies blurry vision, double vision, but he has a chronic left-sided blindness.  ENT: Denies epistaxis, discharge.  RESPIRATION: Denies cough, COPD.  CARDIOVASCULAR: No chest pain, palpitations. Had syncopal episode.  GASTROINTESTINAL: Denies nausea, vomiting, diarrhea and abdominal pain.  GENITOURINARY: No dysuria or hematuria.  ENDOCRINE: Denies polyuria, nocturia. Has diabetes mellitus, gastroparesis, left eye blindness and neuropathy from diabetes mellitus.  HEMATOLOGIC AND LYMPHATIC: Chronic anemia from end-stage renal disease. No easy bruising, bleeding.  INTEGUMENTARY: No acne, rash, lesions.  MUSCULOSKELETAL: No joint pain in the neck and back. Denies gout. NEUROLOGIC:  No vertigo or ataxia.  PSYCHIATRIC: No ADD, OCD.  PHYSICAL EXAMINATION:   VITAL SIGNS: Temperature 98.1, pulse 75, respirations 18, blood pressure 146/94, pulse oximetry 100%.  GENERAL APPEARANCE:  Not in acute distress. Moderately built and nourished. HEENT: Normocephalic, atraumatic. Pupils are equally reactive and accommodation. No scleral icterus. No conjunctival injection. Left eye is blind.  No  sinus tenderness. Moist mucous membranes.  NECK: Supple. No JVD. No thyromegaly. Range of motion is intact.  LUNGS: Clear to auscultation bilaterally. No accessory muscle use and no anterior chest wall tenderness on palpation.  CARDIAC: S1, S2 normal. Regular rate and rhythm. No murmurs. GASTROINTESTINAL: Soft. Bowel sounds are positive in all 4 quadrants. Nontender, nondistended. No hepatosplenomegaly. No masses.  NEUROLOGIC:  Awake, alert, oriented x 3. Motor and sensory grossly intact.  He has unsteady gait from old history of strokes. Reflexes are 2+.  EXTREMITIES: No edema. No cyanosis. No clubbing.  SKIN: Warm and normal turgor. No rashes. No lesions. Right forearm has AV fistula for dialysis.  PSYCHIATRIC: Normal mood and affect.   LABORATORY AND IMAGING STUDIES: CT head without contrast has revealed no acute intracranial findings, advanced moderate ischemia for age with multiple remote small vessel infarcts. Glucose 393, BUN 21, and creatinine 7.16, sodium 133, potassium 4.6, chloride 96, CO2 29. Anion gap 8, GFR 9, serum osmolality and calcium are normal. Troponin normal. WBC 9.7, hemoglobin 11.5, hematocrit 35.5, platelets 230. A 12-lead EKG has revealed normal sinus rhythm with tachycardia at 95 beats per minute. Normal PR and QRS interval. No acute ST-T wave changes.  ASSESSMENT AND PLAN:  1.  A 49 year old [place] American male with syncopal episode lasting for a few seconds without aura.  No seizures like activity but the fall was unwitnessed and he hit his head against the wall. The syncope could be from orthostatic blood pressures as the patient had history of hypotension during dialysis, but in the ER, blood pressure is stable. Other differential can be vasovagal versus cardiac  versus neurogenic.  Admit him to telemetry. Cycle cardiac biomarkers, check orthostatics.  2.  Echocardiogram and carotid Dopplers are ordered. Neuro checks will be obtained. Cardiology consult is placed to Dr.  Juliann Pares.  3.  End-stage renal disease on hemodialysis. Nephrology consult is placed for continuation of dialysis.  4.  History of diabetes mellitus and history of hyperglycemia. Resume his home medication Lantus and also the patient will be on sliding scale insulin.  5.  A past history of 3 cerebrovascular accidents. We will resume his Aggrenox and statin. We will provide gastrointestinal and deep vein thrombosis prophylaxis.   Plan of care discussed with the patient. He is aware of the plan.   Total time spent on admission is 50 minutes.    ____________________________ Ramonita Lab, MD ag:dp D: 03/07/2013 07:43:00 ET T: 03/07/2013 08:16:35 ET JOB#: [id number]  cc: Ramonita Lab, MD, <Dictator> Serita Sheller. Maryellen Pile, MD Ramonita Lab MD ELECTRONICALLY SIGNED 03/21/2013 0:47

## 2014-04-25 NOTE — Discharge Summary (Signed)
PATIENT NAME:  Devin Richmond, Kendrell C MR#:  [id number] DATE OF BIRTH:  05/20/1965  DATE OF ADMISSION:  03/07/2013 DATE OF DISCHARGE:  03/09/2013  PRESENTING COMPLAINT: Syncopal episode.   DISCHARGE DIAGNOSES: 1.  Syncope, resolved.  2.  Hypertension.  3.  End-stage renal disease on hemodialysis.  4.  Urinary tract infection.   CODE STATUS: FULL.  DISCHARGE MEDICATIONS: 1.  Daily Vite 1 tablet p.o. daily.  2.  Aggrenox 1 capsule b.i.d.  3.  Lipitor 80 mg p.o. daily.  4.  Ranitidine 150 mg b.i.d.  5.  Sensipar 90 mg p.o. daily.  6.  Calcium acetate 667 two capsules 3 times a day.  7.  Folic acid 0.4 mg p.o. daily.  8.  Insulin detemir 10 units at bedtime.  9.  Losartan 25 mg daily.  10.  Remeron 30 mg at bedtime.  11.  Keflex 250 p.o. b.i.d.   DISCHARGE INSTRUCTIONS:  1.  Resume your hemodialysis as before on Tuesday, Thursday, and Saturday.  2.  Follow up with Dr. Maryellen PileEason in 1 to 2 weeks.   BRIEF SUMMARY OF HOSPITAL COURSE: [name] is a 49 year old African American gentleman with past medical history of end-stage renal disease, on hemodialysis, and hypertension who comes into the Emergency Room after he had a syncopal episode at home. The patient was admitted on the telemetry floor after he had a syncopal episode at home. He was admitted with:  1.  Syncopal episode. He was admitted on the telemetry floor where his blood pressure remained stable. Orthostatic blood pressure was stable. History of hypotension during dialysis. His cardiac markers remained negative. Normal carotid Doppler echo were normal EF. The patient did not have further episodes.  2.  UTI. Will finish up a course with Keflex.  3.  End-stage renal disease, on hemodialysis. In house hemodialysis was resumed.  4.  History of diabetes. Lantus and sliding scale insulin was continued.  5.  History of CVA. The patient is on Aggrenox and statin.   Cause for syncope was likely due to mild UTI and dehydration. Symptoms resolved  prior to discharge. Hospital stay otherwise remained stable. The patient remained a FULL code.   TIME SPENT: 40 minutes. ____________________________ Wylie HailSona A. Allena KatzPatel, MD sap:sb D: 03/18/2013 13:02:09 ET T: 03/18/2013 14:11:23 ET JOB#: [id number]  cc: Meaghan Whistler A. Allena KatzPatel, MD, <Dictator> Willow OraSONA A Biruk Troia MD ELECTRONICALLY SIGNED 03/21/2013 10:41

## 2014-04-25 NOTE — Consult Note (Signed)
PATIENT NAME:  Devin Richmond, Devin Richmond DATE OF BIRTH:  26-Nov-1965  DATE OF CONSULTATION:  03/07/2013  CONSULTING PHYSICIAN:  Marcina MillardAlexander Nichole Neyer, MD  REASON FOR CONSULTATION: Syncope.  PRIMARY CARE PHYSICIAN: Serita Shellerrnest B. Maryellen PileEason, MD  HISTORY OF PRESENT ILLNESS: The patient is a 49 year old gentleman with history of diabetes, prior CVA, end-stage renal disease on chronic hemodialysis. The patient has known mild cardiomyopathy with LVEF of 40% to 50%. The patient was apparently in his usual state of health, got up to go to the restroom at 3:30 in the morning and experienced a brief syncopal episode, dropped to the floor and hit his head against a wall. The patient called EMS, was [facility] Emergency Room, where EKG was nondiagnostic. The patient was admitted to telemetry, where he has remained in sinus rhythm. Initial troponin is negative. The patient denies chest pain.   PAST MEDICAL HISTORY: 1.  End-stage renal disease, on chronic hemodialysis.  2.  Known history of CVA x 3.  3.  Hypertension.  4.  Hyperlipidemia.  5.  Diabetes. 6.  Gastroparesis. 7.  Neuropathy.  8.  History of left eye blindness. 9.  Mild cardiomyopathy with LVEF of 40% to 50%.   MEDICATIONS: Losartan 25 mg daily, Lipitor 80 mg daily, Sensipar 90 mg daily, ranitidine 150 mg b.i.d., mirtazapine 30 mg daily, insulin 10 units subcutaneous daily, calcium acetate 2 capsules t.i.d., Aggrenox 1 cap b.i.d.   SOCIAL HISTORY: The patient currently lives with his mother. The patient has had a history of prior cocaine use.   FAMILY HISTORY: No immediate family history of coronary artery disease or myocardial infarction.   REVIEW OF SYSTEMS:    CONSTITUTIONAL: No fever or chills.  EYES: No blurry vision.  EARS: No hearing loss.  RESPIRATORY: No shortness of breath.  CARDIOVASCULAR: The patient denies chest pain.  GASTROINTESTINAL: No nausea, vomiting, or diarrhea.  GENITOURINARY: No dysuria or hematuria.  ENDOCRINE:  No polyuria or polydipsia.  MUSCULOSKELETAL: No arthralgias or myalgias.  NEUROLOGICAL: No focal muscle weakness or numbness.  PSYCHOLOGICAL: No depression or anxiety.   PHYSICAL EXAMINATION: VITAL SIGNS: Blood pressure was 148/93 lying, 157/88 sitting, 159/91 standing. Pulse was 87 lying, 88 sitting, and 102 standing. Respirations 18, temperature 97.8, pulse oximetry 97%.  HEENT: Notable for left eye blindness.  NECK: Supple without thyromegaly.  LUNGS: Clear.  HEART: Normal JVP. Normal PMI. Regular rate and rhythm. Normal S1, S2. No appreciable gallop, murmur, or rub.  ABDOMEN: Soft, nontender.  EXTREMITIES: There was no cyanosis, clubbing, or edema. Pulses were intact bilaterally.  MUSCULOSKELETAL: Normal muscle tone.  NEUROLOGIC: The patient was alert and oriented, answering questions appropriately.   IMPRESSION: A 49 year old gentleman with end-stage renal disease on chronic hemodialysis, history of multiple prior strokes, who experienced a syncopal episode of unknown etiology. Initial troponin is negative. The patient has remained in sinus rhythm. Etiology appears to be unclear. Does not appear to be due to cardiac arrhythmia.   RECOMMENDATIONS: 1.  Agree with overall current therapy.  2.  Continue to monitor on telemetry.  3.  Review 2-D echocardiogram.  4.  Further recommendations pending echocardiogram results. I suspect etiology is not cardiac in nature.   ____________________________ Marcina MillardAlexander Ebba Goll, MD ap:jcm D: 03/07/2013 17:29:19 ET T: 03/07/2013 17:59:14 ET JOB#: [id number]  cc: Marcina MillardAlexander Haneefah Venturini, MD, <Dictator> Marcina MillardALEXANDER Emalyn Schou MD ELECTRONICALLY SIGNED 03/25/2013 12:42

## 2014-04-26 NOTE — Op Note (Signed)
PATIENT NAME:  Devin Richmond, Devin Richmond MR#:  [id number] DATE OF BIRTH:  Aug 10, 1965  DATE OF PROCEDURE:  01/18/2011  PREOPERATIVE DIAGNOSIS: Complication AV fistula.   POSTOPERATIVE DIAGNOSIS: Complication AV fistula.   PROCEDURE PERFORMED: Right radiocephalic fistulogram.   SURGEON: Renford DillsGregory G. Taylan Marez, MD  SEDATION: Versed 3 mg plus fentanyl 100 mcg administered IV. Continuous ECG, pulse oximetry and cardiopulmonary monitoring was performed throughout the entire procedure by the interventional radiology nurse. Total sedation time was 30 minutes.   ACCESS: 6 French sheath right arm radiocephalic fistula.   CONTRAST USED: Isovue 15 mL.   FLUORO TIME: Approximately 1 minute.   INDICATIONS: [name] presents with increasing difficulty with cannulation of his right arm fistula. Risks and benefits for contrast injection were reviewed. All questions are answered. Patient agrees to proceed.   DESCRIPTION OF PROCEDURE: Patient is taken to special procedures, placed in supine position. After adequate sedation is achieved, right arm is prepped and draped in sterile fashion. 1% lidocaine is infiltrated in the soft tissues overlying the fistula near the area of the anastomosis and a micropuncture needle is inserted without difficulty. Microwire followed by micro sheath, J-wire followed by a 6 French sheath. Hand injection of contrast is then utilized to demonstrate the fistula.   After review of the images, pursestring suture of 4-0 Monocryl was placed. Sheath was removed and there are no immediate complications.   INTERPRETATION: Fistula appears to have a moderate narrowing right at the antecubital crease. This appears to be on the order of 30% to 40%. At this point in time I do not think that angioplasty is warranted. We will continue to follow the patient with ultrasound.   ____________________________ Renford DillsGregory G. Maridel Pixler, MD ggs:cms D: 01/18/2011 08:55:37 ET T: 01/18/2011 09:22:28  ET JOB#: [id number]  cc: Renford DillsGregory G. Beza Steppe, MD, <Dictator> Renford DillsGREGORY G Zienna Ahlin MD ELECTRONICALLY SIGNED 01/21/2011 12:29

## 2014-05-03 NOTE — H&P (Signed)
PATIENT NAME:  Devin Richmond, Devin Richmond MR#:  [id number] DATE OF BIRTH:  14-Jun-1965  DATE OF ADMISSION:  04/14/2014  REFERRING PHYSICIAN: Rebecka Apley, MD  PRIMARY CARE PHYSICIAN: Nonlocal.   ADMISSION DIAGNOSES: Pulmonary edema and elevated troponin.   HISTORY OF PRESENT ILLNESS: This is a 49 year old African American male, who presents to the Emergency Department complaining shortness of breath and chest pain. The patient states that both awoke from sleep. The chest pain was nonradiating and not associated with nausea or vomiting. The patient says he feels better, but that his shortness of breath is not completely resolved. This is the first time he has ever felt this way. Notably, in the emergency department, the patient was found to be intermittently hypoxic to approximately 89% on pulse oximetry. A  chest x-ray revealed mild pulmonary edema. Notably, the patient admits that he has missed 2 of his last dialysis sessions. His daughter adds that he missed these sessions because he felt too weak to walk on prior appointment days. She is concerned that this coincides with a subjective slurring of his speech more so than usual following his previous stroke. In the Emergency Department, the patient's head was scanned and he was found to have a stroke in the cerebellum that was not previously seen a month prior. Also, the daughter reveals that the patient has been throwing away his medicines and rarely being compliant with into his medical therapies. For the above reasons, the Emergency Department staff was called for admission.   REVIEW OF SYSTEMS:  CONSTITUTIONAL: The patient denies fever, but admits to some weakness in his lower extremities. He denies falling.  HEENT: Denies tinnitus or sore throat.  EYES: Denies blurred vision or inflammation.  CHEST: Denies chest pain, palpitations, orthopnea, paroxysmal nocturnal dyspnea.  LUNGS: Admits to shortness of breath, but denies cough.  GASTROINTESTINAL:  Denies nausea, vomiting, or abdominal pain.  GENITOURINARY: Denies dysuria, increased frequency, or hesitancy of urination.  ENDOCRINE: Denies polyuria or polydipsia.  HEMATOLOGIC AND LYMPHATIC: Denies bleeding or bruising.  INTEGUMENT: Denies rashes or lesions.  MUSCULOSKELETAL: Denies arthralgias or myalgias.  NEUROLOGIC: The patient denies any dysarthria, dysphagia, weakness in his extremities or increased n-coordination. PSYCHIATRIC: Denies depression or suicidal ideation.   PAST MEDICAL HISTORY: End-stage renal disease, hypertension, diabetes type 2 and history of cerebrovascular accident.   PAST SURGICAL HISTORY: The patient is on AV fistula placement in the right forearm.   SOCIAL HISTORY: The patient lives at home. He does not smoke, drink, or do any drugs. He has an uncle that lives with him.   FAMILY HISTORY: The patient's father had hypertension in his mother is recently deceased of lung cancer.   MEDICATIONS:  1. Aggrenox 25 mg/200 mg extended release capsule 1 capsule p.o. b.i.d.  2. Carvedilol 12.5 mg 1 tablet p.o. b.i.d.  3. Dialyvite renal vitamins 1 tablet p.o. daily.  4. Folic acid 0.4 mg 1 tablet p.o. daily.  5. Furosemide 80 mg 1 tablet with once daily on nondialysis days (Mondays, Wednesdays, Fridays and Sundays).  6. Hydralazine 50 mg 2 tablets p.o. t.i.d.  7. Loratadine 10 mg 1 tablet p.o. daily.  8. Ranitidine 150 mg 1 tablet p.o. b.i.d.  9. Sensipar 60 mg 1 tablet p.o. b.i.d. with meals.   ALLERGIES: IODINATED RADIOCONTRAST DYES AND SULFA DRUGS.   PERTINENT LABORATORY RESULTS AND RADIOGRAPHIC FINDINGS: Serum glucose 186, BUN 49, creatinine 14.42, serum sodium 141, potassium 3.6, chloride 101, bicarbonate 28, calcium 9.6. Troponin is 0.07. White blood cell count 8.6,  hemoglobin 11.1, hematocrit 34.8, platelet count 207,000. Chest x-ray shows cardiopericardial enlargement and pulmonary edema. CT of the head without contrast shows a small infarct in the inferior  left cerebellum that is new compared to the previous study on 03/07/2013, but presumed to be chronic. There is also advanced small vessel disease with lacunar infarcts throughout the deep gray matter.   PHYSICAL EXAMINATION:  VITAL SIGNS: Temperature is 97.3, pulse 111, respirations 20, blood pressure 174/104, pulse oximetry is 99% on 2 liters of oxygen via nasal cannula.  GENERAL: The patient is alert and oriented x3 in no apparent distress.  HEENT: Normocephalic, atraumatic. The patient's right pupil is normal in diameter, equal, and reactive and accommodates to light. The patient's left eye is closed, as he prefers to keep that eye shut due to blindness in that eye following a stroke. Mucous membranes are moist.  NECK: Trachea is midline. No adenopathy. Thyroid nonpalpable, nontender.  CHEST: Symmetric and atraumatic.  CARDIOVASCULAR: Regular rate and rhythm. Normal S1, S2. No rubs, clicks, or murmurs appreciated.  LUNGS: Clear to auscultation bilaterally. Normal effort and excursion.  ABDOMEN: Positive bowel sounds. Soft, nontender, nondistended. No hepatosplenomegaly.  GENITOURINARY: Deferred.  MUSCULOSKELETAL: The patient moves all 4 extremities, full range of motion. Has 5% strength in upper and lower extremities bilaterally.  SKIN: Warm and dry. There are no rashes or lesions.  EXTREMITIES: No clubbing, cyanosis, or edema.  NEUROLOGIC: Cranial nerves II through XII are grossly intact with the exception of the patient's left eye, which I cannot test because he is blind in that eye. The patient does have some dysmetria on physical exam, which he states is not new and secondary only to his blindness.  PSYCHIATRIC: Mood is slightly saddened due to the recent passing of his mother. Affect is congruent. The patient seems to have fair insight and judgment into his medical condition; however, I do detect some developmental delay, which is unclear if it is secondary to previous strokes or is  symptomatic of his more recent stroke.   ASSESSMENT AND PLAN: This is a 49 year old male admitted for pulmonary edema and elevated troponin.   1. Pulmonary edema. This is likely secondary to fluid overload due to missed dialysis appointments. He does have some intermittent hypoxemia. We will consult nephrology to place the patient on dialysis. He is going to scheduled today.  2. Elevated troponin. The patient denies chest pain, but he does have inverted T waves in his septal leads on EKG. I have  consulted cardiology, although this may these findings, may represent some right heart strain following pulmonary edema due to the fluid overload mentioned above.  3. Hypertension. The patient was given his morning dose of hydralazine in the Emergency Department. This appears to have controlled his blood pressure significantly. We will restart his home medication regimen.  4. Diabetes type 2 We will add sliding scale insulin.  5. History of cerebrovascular accident. The patient does not appear to have any focal neurologic deficits at this time, but certainly something has caused his weakness and apparent slurring of speech that occurred a few days ago. At this time, I  have not consulted neurology as there does not appear to be an acute problem,  However, we may consider outpatient followup.  6. Noncompliant. We must emphasize to the patient is to take his medications, Otherwise, his condition will continue to deteriorate. 7. Deep vein thrombosis prophylaxis. Subcutaneous heparin .  8. Gastrointestinal prophylaxis, none, as the patient is not critically ill.  CODE STATUS: The patient is a full code.   TIME SPENT ON ADMISSION ORDERS AND PATIENT CARE: Approximately 45 minutes   ____________________________ Kelton Pillar. Sheryle Hail, MD msd:AT D: 04/14/2014 08:40:53 ET T: 04/14/2014 09:00:26 ET JOB#: [id number]  cc: Kelton Pillar. Sheryle Hail, MD, <Dictator> Kelton Pillar Berlinda Farve MD ELECTRONICALLY SIGNED 04/22/2014 9:43

## 2014-05-03 NOTE — Discharge Summary (Signed)
PATIENT NAME:  Devin Richmond, Devin Richmond MR#:  [id number] DATE OF BIRTH:  06-Jan-1965  DATE OF ADMISSION:  04/14/2014 DATE OF DISCHARGE:  04/15/2014  DISCHARGE DIAGNOSES: 1. Acute respiratory failure.  2. Acute on chronic diastolic congestive heart failure.  3. Elevated troponin due to congestive heart failure.  4. End-stage renal disease on hemodialysis.  5. History of cerebrovascular accident.  6. Noncompliance.   IMAGING STUDIES: Include a chest x-ray, which showed nothing acute.   DISCHARGE MEDICATIONS: Please refer to attachment medication reconciliation.   DISCHARGE INSTRUCTIONS: Home health with physical therapy has been set up. Renal diet. Activity as tolerated. Follow up with primary care physician in 1 to 2 weeks. Continue dialysis as before and be compliant with medications and dialysis.   CONSULTATIONS: Laurier NancyShaukat A. Khan, MD with cardiology, and Lennox PippinsMunsoor N. Lateef, MD , with nephrology.   ADMITTING HISTORY AND PHYSICAL: Please see detailed H and P dictated by Dr. Sheryle Hailiamond. In brief, a 49 year old African American male patient, presented to the hospital with complaints of shortness of breath and chest pain. He was found to have pulmonary edema on chest x-ray after missing dialysis, admitted to the hospitalist service.   HOSPITAL COURSE:  Acute on chronic diastolic congestive heart failure with acute respiratory failure due to missing dialysis. The patient was admitted onto the telemetry floor. The patient was seen by cardiology, who said no further invasive initiations. He had very minimal elevation of troponin, which was thought to be secondary to congestive heart failure, was seen by Dr. Welton FlakesKhan. The patient had dialysis during the hospital stay. He improved well. Initially, his saturations were 87% on room air.   By day of discharge, his saturations are 96% on room air. The patient is presently being discharged home in a stable condition with lungs sounding clear. S1, S2 heard. No edema. He has been  set up with home health PT and nursing after discharge, and been counseled to be compliant with his medications.   The time spent on day of discharge in discharge activity was 35 minutes.     ____________________________ Molinda BailiffSrikar R. Diera Wirkkala, MD srs:mw D: 04/17/2014 16:08:25 ET T: 04/17/2014 17:07:21 ET JOB#: [id number]  cc: Wardell HeathSrikar R. Jshawn Hurta, MD, <Dictator> Orie FishermanSRIKAR R Hayden Mabin MD ELECTRONICALLY SIGNED 04/27/2014 11:18

## 2014-05-03 NOTE — Consult Note (Signed)
PATIENT NAME:  Devin Richmond, [name] MR#:  [id number] DATE OF BIRTH:  09/09/1965  DATE OF CONSULTATION:  04/14/2014  REFERRING PHYSICIAN:   CONSULTING PHYSICIAN:  Laurier NancyShaukat A. Maxie Debose, MD  INDICATION FOR THE CONSULTATION: Elevated troponin.   HISTORY OF PRESENT ILLNESS: This is a 49 year old African-American male with a past medical history of hypertension, hyperlipidemia, diabetes, end-stage renal disease for the past 3 years on dialysis, presented to the Emergency Room with severe shortness of breath, orthopnea, and difficulty sleeping because of shortness of breath. Apparently he was in pulmonary edema and had elevated troponin, thus I was asked to evaluate the patient. The patient was also having desaturation with pulse oximetry showing 89% when he first came. He is feeling much better.  His shortness of breath has significantly improved.   PAST MEDICAL HISTORY: History of end-stage renal disease, hypertension, type 2 diabetes, hyperlipidemia, history of CVA, he had an AV fistula implanted in right forearm 3 years ago and has been getting dialysis.   SOCIAL HISTORY: He denies EtOH abuse or smoking.   FAMILY HISTORY: Positive for hypertension.   HOME MEDICATIONS: Carvedilol 12.5 b.i.d., Lasix 80 mg Monday, Wednesday, Friday, and Sunday, hydralazine 50 b.i.d., ranitidine 150 b.i.d.   ALLERGIES: IODINATED CONTRAST AGENTS AND SULFA DRUGS.   PHYSICAL EXAMINATION:  GENERAL: He is alert and oriented x 3, in no acute distress. He is getting dialysis right now. VITAL SIGNS:  His blood pressure is 174/104, respirations 20, pulse is 111, temperature is 97.3, saturation is 99.  HEENT: Positive JVD.  LUNGS: There are few crepitations at the bases.  HEART: Regular rate and rhythm. Normal S1, S2. No audible murmur.  ABDOMEN: Soft, nontender, positive bowel sounds.  EXTREMITIES: No pedal edema.  NEUROLOGIC: The patient appears to be intact.   LABORATORY DATA:  His EKG shows sinus tachycardia at 106 beats per  minute, left atrial enlargement, nonspecific ST-T changes. His BUN is 49, creatinine is 14.4. Troponin is 0.07, second set is also 0.07. His white count is 8.6, hemoglobin is 11.1, platelet count 207,000.   ASSESSMENT AND PLAN: The patient has only mildly elevated troponin with history of congestive heart failure, still has elevated blood pressure, he is taking good medicines, the question is whether he is compliant. Will go up on carvedilol to 25 b.i.d. and also will add ACE inhibitors, because he is getting dialysis he can tolerate ACE inhibitors. Will look at his echocardiogram to look at wall motion. He just has mildly elevated troponin. This can be evaluated for coronary artery disease as an outpatient on stress test. Right now he seems like he is not having non-STEMI. Denies any chest pain. EKG has no acute changes.    Thank you very much for the referral.      ____________________________ Laurier NancyShaukat A. Caylon Saine, MD sak:bu D: 04/14/2014 13:06:21 ET T: 04/14/2014 13:33:45 ET JOB#: [id number]  cc: Laurier NancyShaukat A. Rodarius Kichline, MD, <Dictator> Laurier NancySHAUKAT A Fabianna Keats MD ELECTRONICALLY SIGNED 04/17/2014 15:28

## 2014-05-07 ENCOUNTER — Other Ambulatory Visit: Payer: Self-pay

## 2014-05-07 ENCOUNTER — Emergency Department
Admission: EM | Admit: 2014-05-07 | Discharge: 2014-05-07 | Disposition: A | Discharge status: Home / Self Care | Payer: Medicare Other | Attending: Student | Admitting: Student

## 2014-05-07 ENCOUNTER — Encounter: Payer: Self-pay | Admitting: *Deleted

## 2014-05-07 ENCOUNTER — Emergency Department: Payer: Medicare Other

## 2014-05-07 DIAGNOSIS — Z79899 Other long term (current) drug therapy: Secondary | ICD-10-CM | POA: Diagnosis not present

## 2014-05-07 DIAGNOSIS — E119 Type 2 diabetes mellitus without complications: Secondary | ICD-10-CM | POA: Diagnosis not present

## 2014-05-07 DIAGNOSIS — R410 Disorientation, unspecified: Secondary | ICD-10-CM | POA: Insufficient documentation

## 2014-05-07 DIAGNOSIS — I10 Essential (primary) hypertension: Secondary | ICD-10-CM | POA: Diagnosis not present

## 2014-05-07 DIAGNOSIS — Z794 Long term (current) use of insulin: Secondary | ICD-10-CM | POA: Diagnosis not present

## 2014-05-07 DIAGNOSIS — Z7982 Long term (current) use of aspirin: Secondary | ICD-10-CM | POA: Insufficient documentation

## 2014-05-07 HISTORY — DX: Cerebral infarction, unspecified: I63.9

## 2014-05-07 HISTORY — DX: Essential (primary) hypertension: I10

## 2014-05-07 HISTORY — DX: Type 2 diabetes mellitus without complications: E11.9

## 2014-05-07 HISTORY — DX: Disorder of kidney and ureter, unspecified: N28.9

## 2014-05-07 LAB — CBC
HEMATOCRIT: 40.8 % (ref 40.0–52.0)
HEMOGLOBIN: 13.2 g/dL (ref 13.0–18.0)
MCH: 31.3 pg (ref 26.0–34.0)
MCHC: 32.2 g/dL (ref 32.0–36.0)
MCV: 97.3 fL (ref 80.0–100.0)
Platelets: 203 10*3/uL (ref 150–440)
RBC: 4.2 MIL/uL — ABNORMAL LOW (ref 4.40–5.90)
RDW: 16 % — ABNORMAL HIGH (ref 11.5–14.5)
WBC: 7.3 10*3/uL (ref 3.8–10.6)

## 2014-05-07 LAB — BASIC METABOLIC PANEL
ANION GAP: 10 (ref 5–15)
BUN: 11 mg/dL (ref 6–20)
CO2: 35 mmol/L — AB (ref 22–32)
CREATININE: 6.03 mg/dL — AB (ref 0.61–1.24)
Calcium: 8.6 mg/dL — ABNORMAL LOW (ref 8.9–10.3)
Chloride: 96 mmol/L — ABNORMAL LOW (ref 101–111)
GFR calc non Af Amer: 10 mL/min — ABNORMAL LOW (ref >60)
GFR, EST AFRICAN AMERICAN: 11 mL/min — AB (ref >60)
Glucose, Bld: 138 mg/dL — ABNORMAL HIGH (ref 65–99)
Potassium: 3.4 mmol/L — ABNORMAL LOW (ref 3.5–5.1)
Sodium: 141 mmol/L (ref 135–145)

## 2014-05-07 NOTE — ED Notes (Signed)
Pt arrives via EMS with complaints of "not feeling well", and "I cant remember anything", pt dialysis with access R forearm, pt had tx today and states the symptoms started after his tx, pt denies any pain, pt has hx oc CVA with left sided deficits and blindness in left eye

## 2014-05-07 NOTE — ED Provider Notes (Signed)
[facility] Emergency Department Provider Note    ____________________________________________  Time seen: ----------------------------------------- 3:40 PM on 05/07/2014 -----------------------------------------    I have reviewed the triage vital signs and the nursing notes.   HISTORY  Chief Complaint Weakness and Memory Loss       HPI Devin Richmond is a 49 y.o. male with past medical history significant for end-stage renal disease on dialysis, history of prior CVA with mild left-sided deficit, diabetes, hyperlipidemia presents for evaluation of transient confusion. Patient reports that he received his full dialysis treatment today after which he be began feeling confused, he was unable to remember where he was or how he got home from dialysis. This has resolved. He now remembers how he got home and knows where he is. He denies any associated chest pain, trouble breathing, new numbness or weakness or any speech difficulty. Nothing makes it better or worse.  Location: neurological, generalized Duration: just after dialysis earlier today, now resolved Timing: gradual Severity: Maximal severity +, current severity 0 out of 10    Past Medical History  Diagnosis Date  . CVA (cerebral infarction)   . Diabetes mellitus without complication   . Hypertension   . Kidney disease     There are no active problems to display for this patient.   History reviewed. No pertinent past surgical history.  Current Outpatient Rx  Name  Route  Sig  Dispense  Refill  . aspirin 81 MG tablet   Oral   Take 81 mg by mouth daily.         Marland Kitchen. atorvastatin (LIPITOR) 80 MG tablet   Oral   Take 80 mg by mouth daily.         . carvedilol (COREG) 3.125 MG tablet   Oral   Take 3.125 mg by mouth 2 (two) times daily with a meal.         . cinacalcet (SENSIPAR) 90 MG tablet   Oral   Take 90 mg by mouth daily.         Marland Kitchen. dipyridamole-aspirin (AGGRENOX)  200-25 MG per 12 hr capsule   Oral   Take 1 capsule by mouth 2 (two) times daily.         . folic acid (FOLVITE) 400 MCG tablet   Oral   Take 400 mcg by mouth daily.         . hydrALAZINE (APRESOLINE) 100 MG tablet   Oral   Take 100 mg by mouth 3 (three) times daily.         . insulin aspart (NOVOLOG) 100 UNIT/ML injection   Subcutaneous   Inject 7 Units into the skin 3 (three) times daily before meals.         . insulin detemir (LEVEMIR) 100 UNIT/ML injection   Subcutaneous   Inject 25 Units into the skin at bedtime.         Marland Kitchen. lanthanum (FOSRENOL) 1000 MG chewable tablet   Oral   Chew 1,000 mg by mouth 3 (three) times daily with meals.         Marland Kitchen. loratadine (CLARITIN) 10 MG tablet   Oral   Take 10 mg by mouth daily.         . meclizine (ANTIVERT) 25 MG tablet   Oral   Take 25 mg by mouth 2 (two) times daily.         . mirtazapine (REMERON) 30 MG tablet   Oral   Take 30 mg by mouth at  bedtime.         . Multiple Vitamin (MULTIVITAMIN WITH MINERALS) TABS tablet   Oral   Take 1 tablet by mouth daily.         . ranitidine (ZANTAC) 150 MG capsule   Oral   Take 150 mg by mouth every evening.           Allergies Sulfa antibiotics  History reviewed. No pertinent family history.  Social History History  Substance Use Topics  . Smoking status: Never Smoker   . Smokeless tobacco: Not on file  . Alcohol Use: No    Review of Systems  Constitutional: Negative for fever. Eyes: Negative for visual changes. +chronic left eye vision loss ENT: Negative for sore throat. Cardiovascular: Negative for chest pain. Respiratory: Negative for shortness of breath. Gastrointestinal: Negative for abdominal pain, vomiting and diarrhea. Genitourinary: Negative for dysuria. Musculoskeletal: Negative for back pain. Skin: Negative for rash. Neurological: Negative for headaches, negative for new focal weakness or numbness.   10-point ROS otherwise  negative.  ____________________________________________   PHYSICAL EXAM:  VITAL SIGNS: ED Triage Vitals  Enc Vitals Group     BP 05/07/14 1320 137/85 mmHg     Pulse Rate 05/07/14 1320 80     Resp 05/07/14 1320 20     Temp 05/07/14 1320 98 F (36.7 C)     Temp Source 05/07/14 1320 Oral     SpO2 05/07/14 1320 99 %     Weight 05/07/14 1320 199 lb (90.266 kg)     Height 05/07/14 1320 6' (1.829 m)     Head Cir --      Peak Flow --      Pain Score --      Pain Loc --      Pain Edu? --      Excl. in GC? --      Constitutional: Alert and oriented x4. Well appearing and in no distress. Eyes: Conjunctivae are normal. Normal pupil right eye; chronic abnormality/disfigurement of the left eye ENT   Head: Normocephalic and atraumatic.   Nose: No congestion/rhinnorhea.   Mouth/Throat: Mucous membranes are moist.   Neck: No stridor. Hematological/Lymphatic/Immunilogical: No cervical lymphadenopathy. Cardiovascular: Normal rate, regular rhythm. Normal and symmetric distal pulses are present in all extremities. No murmurs, rubs, or gallops. Respiratory: Normal respiratory effort without tachypnea nor retractions. Breath sounds are clear and equal bilaterally. No wheezes/rales/rhonchi. Gastrointestinal: Soft and nontender. No distention. No abdominal bruits. There is no CVA tenderness. Genitourinary: deferred Musculoskeletal: Nontender with normal range of motion in all extremities. No joint effusions.  No lower extremity tenderness nor edema. Neurologic:  Normal speech and language. No gross focal neurologic deficits are appreciated. Speech is normal. No aphasia, 5 out of 5 strength in bilateral upper and lower extremities, cranial nerves II through XII intact Skin:  Skin is warm, dry and intact. No rash noted. Psychiatric: Mood and affect are normal. Speech and behavior are normal. Patient exhibits appropriate insight and  judgment.  ____________________________________________    LABS (pertinent positives/negatives)  Elevated creatinine (as expected in ESRD) but generally unremarkable otherwise.  ____________________________________________   EKG  ED ECG REPORT   Date: 05/07/2014  EKG Time: 1326  Rate: 81  Rhythm: Sinus rhythm with occasional PVCs/fusion complexes  Axis: Normal  Intervals:none  ST&T Change: No acute ST segment elevation   ____________________________________________    RADIOLOGY  EXAM: CT HEAD WITHOUT CONTRAST  TECHNIQUE: Contiguous axial images were obtained from the base of the skull through the  vertex without intravenous contrast.  COMPARISON: 04/14/2014  FINDINGS: No skull fracture is noted. Paranasal sinuses and mastoid air cells are unremarkable. No intracranial hemorrhage, mass effect or midline shift.  Again noted atrophy and extensive chronic white matter disease. Bilateral multiple lacunar infarcts are again noted. No definite evidence of acute cortical infarction. No mass lesion is noted on this unenhanced scan. Stable chronic small infarct in left cerebellum.  IMPRESSION: No acute intracranial abnormality. Stable atrophy and chronic white matter disease. Stable chronic findings as described above.   Electronically Signed By: Natasha MeadLiviu Pop M.D. On: 05/07/2014 15:48  ____________________________________________   PROCEDURES  Procedure(s) performed: None  Critical Care performed: No  ____________________________________________   INITIAL IMPRESSION / ASSESSMENT AND PLAN / ED COURSE  Pertinent labs & imaging results that were available during my care of the patient were reviewed by me and considered in my medical decision making (see chart for details).  Devin Richmond is a 49 y.o. male with past medical history significant for end-stage renal disease on dialysis, history of prior CVA with mild left-sided deficit, diabetes,  hyperlipidemia presents for evaluation of transient confusion, now resolved. On exam, he is well-appearing and in no acute distress, vital signs stable. He is alert and oriented 4, has an intact neuro exam for me. Suspect transient confusion in the setting of electrolyte/fluid shift after dialysis.  ----------------------------------------- 4:26 PM on 05/07/2014 -----------------------------------------  Labs generally unremarkable. CT head with chronic findings. Patient appears well. Still A&Ox4. DC home with return precautions and close PCP follow-up.  ____________________________________________   FINAL CLINICAL IMPRESSION(S) / ED DIAGNOSES  Final diagnoses:  Confusion with non-focal neuro exam     Gayla DossEryka A Miguel Christiana, MD 05/07/14 [phone]

## 2014-05-07 NOTE — Discharge Instructions (Signed)
Confusion °Confusion is the inability to think with your usual speed or clarity. Confusion may come on quickly or slowly over time. How quickly the confusion comes on depends on the cause. Confusion can be due to any number of causes. °CAUSES  °· Concussion, head injury, or head trauma. °· Seizures. °· Stroke. °· Fever. °· Brain tumor. °· Age related decreased brain function (dementia). °· Heightened emotional states like rage or terror. °· Mental illness in which the person loses the ability to determine what is real and what is not (hallucinations). °· Infections such as a urinary tract infection (UTI). °· Toxic effects from alcohol, drugs, or prescription medicines. °· Dehydration and an imbalance of salts in the body (electrolytes). °· Lack of sleep. °· Low blood sugar (diabetes). °· Low levels of oxygen from conditions such as chronic lung disorders. °· Drug interactions or other medicine side effects. °· Nutritional deficiencies, especially niacin, thiamine, vitamin C, or vitamin B. °· Sudden drop in body temperature (hypothermia). °· Change in routine, such as when traveling or hospitalized. °SIGNS AND SYMPTOMS  °People often describe their thinking as cloudy or unclear when they are confused. Confusion can also include feeling disoriented. That means you are unaware of where or who you are. You may also not know what the date or time is. If confused, you may also have difficulty paying attention, remembering, and making decisions. Some people also act aggressively when they are confused.  °DIAGNOSIS  °The medical evaluation of confusion may include: °· Blood and urine tests. °· X-rays. °· Brain and nervous system tests. °· Analyzing your brain waves (electroencephalogram or EEG). °· Magnetic resonance imaging (MRI) of your head. °· Computed tomography (CT) scan of your head. °· Mental status tests in which your health care provider may ask many questions. Some of these questions may seem silly or strange,  but they are a very important test to help diagnose and treat confusion. °TREATMENT  °An admission to the hospital may not be needed, but a person with confusion should not be left alone. Stay with a family member or friend until the confusion clears. Avoid alcohol, pain relievers, or sedative drugs until you have fully recovered. Do not drive until directed by your health care provider. °HOME CARE INSTRUCTIONS  °What family and friends can do: °· To find out if someone is confused, ask the person to state his or her name, age, and the date. If the person is unsure or answers incorrectly, he or she is confused. °· Always introduce yourself, no matter how well the person knows you. °· Often remind the person of his or her location. °· Place a calendar and clock near the confused person. °· Help the person with his or her medicines. You may want to use a pill box, an alarm as a reminder, or give the person each dose as prescribed. °· Talk about current events and plans for the day. °· Try to keep the environment calm, quiet, and peaceful. °· Make sure the person keeps follow-up visits with his or her health care provider. °PREVENTION  °Ways to prevent confusion: °· Avoid alcohol. °· Eat a balanced diet. °· Get enough sleep. °· Take medicine only as directed by your health care provider. °· Do not become isolated. Spend time with other people and make plans for your days. °· Keep careful watch on your blood sugar levels if you are diabetic. °SEEK IMMEDIATE MEDICAL CARE IF:  °· You develop severe headaches, repeated vomiting, seizures, blackouts, or   slurred speech. °· There is increasing confusion, weakness, numbness, restlessness, or personality changes. °· You develop a loss of balance, have marked dizziness, feel uncoordinated, or fall. °· You have delusions, hallucinations, or develop severe anxiety. °· Your family members think you need to be rechecked. °Document Released: 01/27/2004 Document Revised: 05/05/2013  Document Reviewed: 01/24/2013 °ExitCare® Patient Information ©2015 [employer]. This information is not intended to replace advice given to you by your health care provider. Make sure you discuss any questions you have with your health care provider. ° °Altered Mental Status °Altered mental status most often refers to an abnormal change in your responsiveness and awareness. It can affect your speech, thought, mobility, memory, attention span, or alertness. It can range from slight confusion to complete unresponsiveness (coma). Altered mental status can be a sign of a serious underlying medical condition. Rapid evaluation and medical treatment is necessary for patients having an altered mental status. °CAUSES  °· Low blood sugar (hypoglycemia) or diabetes. °· Severe loss of body fluids (dehydration) or a body salt (electrolyte) imbalance. °· A stroke or other neurologic problem, such as dementia or delirium. °· A head injury or tumor. °· A drug or alcohol overdose. °· Exposure to toxins or poisons. °· Depression, anxiety, and stress. °· A low oxygen level (hypoxia). °· An infection. °· Blood loss. °· Twitching or shaking (seizure). °· Heart problems, such as heart attack or heart rhythm problems (arrhythmias). °· A body temperature that is too low or too high (hypothermia or hyperthermia). °DIAGNOSIS  °A diagnosis is based on your history, symptoms, physical and neurologic examinations, and diagnostic tests. Diagnostic tests may include: °· Measurement of your blood pressure, pulse, breathing, and oxygen levels (vital signs). °· Blood tests. °· Urine tests. °· X-ray exams. °· A computerized magnetic scan (magnetic resonance imaging, MRI). °· A computerized X-ray scan (computed tomography, CT scan). °TREATMENT  °Treatment will depend on the cause. Treatment may include: °· Management of an underlying medical or mental health condition. °· Critical care or support in the hospital. °HOME CARE INSTRUCTIONS  °· Only  take over-the-counter or prescription medicines for pain, discomfort, or fever as directed by your caregiver. °· Manage underlying conditions as directed by your caregiver. °· Eat a healthy, well-balanced diet to maintain strength. °· Join a support group or prevention program to cope with the condition or trauma that caused the altered mental status. Ask your caregiver to help choose a program that works for you. °· Follow up with your caregiver for further examination, therapy, or testing as directed. °SEEK MEDICAL CARE IF:  °· You feel unwell or have chills. °· You or your family notice a change in your behavior or your alertness. °· You have trouble following your caregiver's treatment plan. °· You have questions or concerns. °SEEK IMMEDIATE MEDICAL CARE IF:  °· You have a rapid heartbeat or have chest pain. °· You have difficulty breathing. °· You have a fever. °· You have a headache with a stiff neck. °· You cough up blood. °· You have blood in your urine or stool. °· You have severe agitation or confusion. °MAKE SURE YOU:  °· Understand these instructions. °· Will watch your condition. °· Will get help right away if you are not doing well or get worse. °Document Released: 06/08/2009 Document Revised: 03/13/2011 Document Reviewed: 06/08/2009 °ExitCare® Patient Information ©2015 [employer]. This information is not intended to replace advice given to you by your health care provider. Make sure you discuss any questions you have   with your health care provider. ° °

## 2014-07-20 ENCOUNTER — Encounter: Payer: Self-pay | Admitting: *Deleted

## 2014-07-20 ENCOUNTER — Ambulatory Visit
Admission: RE | Admit: 2014-07-20 | Discharge: 2014-07-20 | Disposition: A | Discharge status: Home / Self Care | Payer: Medicare Other | Source: Ambulatory Visit | Attending: Vascular Surgery | Admitting: Vascular Surgery

## 2014-07-20 ENCOUNTER — Encounter
Admission: RE | Disposition: A | Discharge status: Home / Self Care | Payer: Self-pay | Source: Ambulatory Visit | Attending: Vascular Surgery

## 2014-07-20 DIAGNOSIS — Z79899 Other long term (current) drug therapy: Secondary | ICD-10-CM | POA: Diagnosis not present

## 2014-07-20 DIAGNOSIS — K219 Gastro-esophageal reflux disease without esophagitis: Secondary | ICD-10-CM | POA: Diagnosis not present

## 2014-07-20 DIAGNOSIS — I120 Hypertensive chronic kidney disease with stage 5 chronic kidney disease or end stage renal disease: Secondary | ICD-10-CM | POA: Insufficient documentation

## 2014-07-20 DIAGNOSIS — Z992 Dependence on renal dialysis: Secondary | ICD-10-CM | POA: Insufficient documentation

## 2014-07-20 DIAGNOSIS — E1122 Type 2 diabetes mellitus with diabetic chronic kidney disease: Secondary | ICD-10-CM | POA: Insufficient documentation

## 2014-07-20 DIAGNOSIS — E11319 Type 2 diabetes mellitus with unspecified diabetic retinopathy without macular edema: Secondary | ICD-10-CM | POA: Insufficient documentation

## 2014-07-20 DIAGNOSIS — K859 Acute pancreatitis, unspecified: Secondary | ICD-10-CM | POA: Insufficient documentation

## 2014-07-20 DIAGNOSIS — N186 End stage renal disease: Secondary | ICD-10-CM | POA: Diagnosis not present

## 2014-07-20 DIAGNOSIS — D649 Anemia, unspecified: Secondary | ICD-10-CM | POA: Insufficient documentation

## 2014-07-20 DIAGNOSIS — T82858A Stenosis of vascular prosthetic devices, implants and grafts, initial encounter: Secondary | ICD-10-CM | POA: Insufficient documentation

## 2014-07-20 DIAGNOSIS — Z8673 Personal history of transient ischemic attack (TIA), and cerebral infarction without residual deficits: Secondary | ICD-10-CM | POA: Diagnosis not present

## 2014-07-20 HISTORY — DX: Type 2 diabetes mellitus with unspecified diabetic retinopathy without macular edema: E11.319

## 2014-07-20 HISTORY — DX: Anemia, unspecified: D64.9

## 2014-07-20 HISTORY — PX: PERIPHERAL VASCULAR CATHETERIZATION: SHX172C

## 2014-07-20 HISTORY — DX: Polyneuropathy, unspecified: G62.9

## 2014-07-20 HISTORY — DX: Cerebral infarction, unspecified: I63.9

## 2014-07-20 HISTORY — DX: Gastro-esophageal reflux disease without esophagitis: K21.9

## 2014-07-20 HISTORY — DX: Acute pancreatitis without necrosis or infection, unspecified: K85.90

## 2014-07-20 LAB — POTASSIUM (ARMC VASCULAR LAB ONLY): POTASSIUM (ARMC VASCULAR LAB): 4.8

## 2014-07-20 LAB — GLUCOSE, CAPILLARY: GLUCOSE-CAPILLARY: 162 mg/dL — AB (ref 65–99)

## 2014-07-20 SURGERY — A/V SHUNTOGRAM/FISTULAGRAM
Anesthesia: Moderate Sedation | Laterality: Right

## 2014-07-20 MED ORDER — IOHEXOL 300 MG/ML  SOLN
INTRAMUSCULAR | Status: DC | PRN
Start: 2014-07-20 — End: 2014-07-20
  Administered 2014-07-20: 35 mL via INTRA_ARTERIAL

## 2014-07-20 MED ORDER — FENTANYL CITRATE (PF) 100 MCG/2ML IJ SOLN
INTRAMUSCULAR | Status: AC
  Filled 2014-07-20: qty 2

## 2014-07-20 MED ORDER — HEPARIN SODIUM (PORCINE) 1000 UNIT/ML IJ SOLN
INTRAMUSCULAR | Status: DC | PRN
  Administered 2014-07-20: 3000 [IU] via INTRAVENOUS

## 2014-07-20 MED ORDER — FENTANYL CITRATE (PF) 100 MCG/2ML IJ SOLN
INTRAMUSCULAR | Status: DC | PRN
  Administered 2014-07-20 (×2): 50 ug via INTRAVENOUS

## 2014-07-20 MED ORDER — MIDAZOLAM HCL 2 MG/2ML IJ SOLN
INTRAMUSCULAR | Status: DC | PRN
  Administered 2014-07-20: 1 mg via INTRAVENOUS
  Administered 2014-07-20: 2 mg via INTRAVENOUS

## 2014-07-20 MED ORDER — HEPARIN (PORCINE) IN NACL 2-0.9 UNIT/ML-% IJ SOLN
INTRAMUSCULAR | Status: AC
  Filled 2014-07-20: qty 1000

## 2014-07-20 MED ORDER — HEPARIN SODIUM (PORCINE) 1000 UNIT/ML IJ SOLN
INTRAMUSCULAR | Status: AC
  Filled 2014-07-20: qty 1

## 2014-07-20 MED ORDER — CEFAZOLIN SODIUM 1-5 GM-% IV SOLN
INTRAVENOUS | Status: AC
  Filled 2014-07-20: qty 50

## 2014-07-20 MED ORDER — LIDOCAINE HCL (PF) 1 % IJ SOLN
INTRAMUSCULAR | Status: AC
  Filled 2014-07-20: qty 5

## 2014-07-20 MED ORDER — CEFAZOLIN SODIUM 1-5 GM-% IV SOLN
1.0000 g | Freq: Once | INTRAVENOUS | Status: AC
  Administered 2014-07-20: 1 g via INTRAVENOUS

## 2014-07-20 MED ORDER — LIDOCAINE-EPINEPHRINE (PF) 1 %-1:200000 IJ SOLN
INTRAMUSCULAR | Status: DC | PRN
  Administered 2014-07-20: 10 mL via INTRADERMAL

## 2014-07-20 MED ORDER — SODIUM CHLORIDE 0.9 % IV SOLN
INTRAVENOUS | Status: DC
  Administered 2014-07-20: 10:00:00 via INTRAVENOUS

## 2014-07-20 MED ORDER — MIDAZOLAM HCL 5 MG/5ML IJ SOLN
INTRAMUSCULAR | Status: AC
  Filled 2014-07-20: qty 5

## 2014-07-20 SURGICAL SUPPLY — 13 items
BALLN DORADO 8X40X80 (BALLOONS) ×4
BALLN LUTONIX DCB 7X60X130 (BALLOONS) ×4
BALLOON DORADO 8X40X80 (BALLOONS) ×2 IMPLANT
BALLOON LUTONIX DCB 7X60X130 (BALLOONS) ×2 IMPLANT
CANNULA 5F STIFF (CANNULA) ×4 IMPLANT
CATH KUMPE (CATHETERS) ×2
CATH SLIP 5FR .038X65 KMP (CATHETERS) ×2
CATH SLIP 5FR 0.38 X 40 KMP (CATHETERS) ×2 IMPLANT
DRAPE BRACHIAL (DRAPES) ×4 IMPLANT
PACK ANGIOGRAPHY (CUSTOM PROCEDURE TRAY) ×4 IMPLANT
SHEATH BRITE TIP 6FRX5.5 (SHEATH) ×4 IMPLANT
TOWEL OR 17X26 4PK STRL BLUE (TOWEL DISPOSABLE) ×4 IMPLANT
WIRE MAGIC TORQUE 260C (WIRE) ×4 IMPLANT

## 2014-07-20 NOTE — Discharge Instructions (Signed)
Fistulogram, Care After °Refer to this sheet in the next few weeks. These instructions provide you with information on caring for yourself after your procedure. Your health care provider may also give you more specific instructions. Your treatment has been planned according to current medical practices, but problems sometimes occur. Call your health care provider if you have any problems or questions after your procedure. °WHAT TO EXPECT AFTER THE PROCEDURE °After your procedure, it is typical to have the following: °· A small amount of discomfort in the area where the catheters were placed. °· A small amount of bruising around the fistula. °· Sleepiness and fatigue. °HOME CARE INSTRUCTIONS °· Rest at home for the day following your procedure. °· Do not drive or operate heavy machinery while taking pain medicine. °· Take medicines only as directed by your health care provider. °· Do not take baths, swim, or use a hot tub until your health care provider approves. You may shower 24 hours after the procedure or as directed by your health care provider. °· There are many different ways to close and cover an incision, including stitches, skin glue, and adhesive strips. Follow your health care provider's instructions on: °¨ Incision care. °¨ Bandage (dressing) changes and removal. °¨ Incision closure removal. °· Monitor your dialysis fistula carefully. °SEEK MEDICAL CARE IF: °· You have drainage, redness, swelling, or pain at your catheter site. °· You have a fever. °· You have chills. °SEEK IMMEDIATE MEDICAL CARE IF: °· You feel weak. °· You have trouble balancing. °· You have trouble moving your arms or legs. °· You have problems with your speech or vision. °· You can no longer feel a vibration or buzz when you put your fingers over your dialysis fistula. °· The limb that was used for the procedure: °¨ Swells. °¨ Is painful. °¨ Is cold. °¨ Is discolored, such as blue or pale white. °Document Released: 05/05/2013  Document Reviewed: 02/07/2013 °Exit[employer]® Patient Information ©2015 [employer]. This information is not intended to replace advice given to you by your health care provider. Make sure you discuss any questions you have with your health care provider. ° °

## 2014-07-20 NOTE — CV Procedure (Signed)
[facility]    OPERATIVE NOTE   PROCEDURE: 1.   Right radiocephalic arteriovenous fistula cannulation under ultrasound guidance 2.   Right arm fistulagram including central venogram 3.   Percutaneous transluminal angioplasty of the median antecubital vein and basilic vein at the elbow with 7 mm diameter drug-coated angioplasty balloon and 8 mm diameter high pressure angioplasty balloon  PRE-OPERATIVE DIAGNOSIS: 1. ESRD 2. 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 male who presents with malfunctioning  right radiocephalic arteriovenous fistula.  The patient is scheduled for  right arm fistulagram.  The patient is aware the risks include but are not limited to: bleeding, infection, thrombosis of the cannulated access, and possible anaphylactic reaction to the contrast.  The patient is aware of the risks of the procedure and elects to proceed forward.  DESCRIPTION: After full informed written consent was obtained, the patient was brought back to the angiography suite and placed supine upon the angiography table.  The patient was connected to monitoring equipment.  The  right arm was prepped and draped in the standard fashion for a percutaneous access intervention.  Under ultrasound guidance, the  right radiocephalic arteriovenous fistula was cannulated with a micropuncture needle under direct ultrasound guidance and a permanent image was performed.  The microwire was advanced into the fistula and the needle was exchanged for the a microsheath.  I then upsized to a 6 Fr Sheath and imaging was  performed.  Hand injections were completed to image the access including the central venous system. This demonstrated the upper arm outflow to be through the basilic vein and the median antecubital vein draining the fistula at the elbow had about a 75-80% stenosis. It also appeared somewhat narrow between aneurysmal access segments, but it was difficult to discern whether or not this was just a relatively smaller vessel compared to the aneurysmal segments.  Based on the images, this patient will need treatment of the median antecubital vein to improve outflow. I then gave the patient 3000 units of intravenous heparin.  I then crossed the stenosis with a Magic Tourqe wire.  Based on the imaging, a 7 mm x 6 cm  Lutonix drug-coated angioplasty balloon was selected.  The balloon was centered around the median antecubital vein and basilic vein stenosis and inflated to 12 ATM for 1 minute(s).  Full profile was not met, and I elected to upsized to an 8 mm diameter high pressure angioplasty balloon. This was inflated to 18 atm. On completion imaging, a 20-25 % residual stenosis was present that was not flow limiting.   I then pulled the 69mm diameter balloon back to the area between access sites and inflated the balloon to about 12 atm. Very little waist was seen, and I do not think this represented a stenosis as much as a relatively normal vessel between aneurysmal segments with some tortuosity. Completion angiogram appeared unchanged after balloon inflation and the flow was brisk through this area.  Based on the completion imaging, no further intervention is necessary.  The wire and balloon were removed from the sheath.  A 4-0 Monocryl purse-string suture was sewn around the sheath.  The sheath was removed while tying down the suture.  A sterile bandage was applied to the puncture site.  COMPLICATIONS: None  CONDITION: Stable   Devin Richmond  07/20/2014 10:58 AM

## 2014-07-20 NOTE — H&P (Signed)
[facility] Admission History & Physical  MRN : [id number]  Devin Richmond is a 49 y.o. (1965-06-27) male who presents with chief complaint of No chief complaint on file. Marland Kitchen.  History of Present Illness: Patient is referred from his dialysis access center for diminished flow in his dialysis access. He is in his usual state of health and has no complaints today. This is been noted over the past couple of weeks.  Current Facility-Administered Medications  Medication Dose Route Frequency Provider Last Rate Last Dose  . 0.9 %  sodium chloride infusion   Intravenous Continuous Annice NeedyJason S Dew, MD      . ceFAZolin (ANCEF) IVPB 1 g/50 mL premix  1 g Intravenous Once Annice NeedyJason S Dew, MD        Past Medical History  Diagnosis Date  . CVA (cerebral infarction)   . Diabetes mellitus without complication   . Hypertension   . Kidney disease   . Stroke   . Anemia   . GERD (gastroesophageal reflux disease)   . Pancreatitis   . Diabetic retinopathy   . Neuropathy     peripheral    Past Surgical History  Procedure Laterality Date  . Fistula placement left upper arm      Social History History  Substance Use Topics  . Smoking status: Never Smoker   . Smokeless tobacco: Not on file  . Alcohol Use: No  No IV drug use  Family History No history of bleeding disorders, clotting disorders, autoimmune diseases, or aneurysms  Allergies  Allergen Reactions  . Sulfa Antibiotics      REVIEW OF SYSTEMS (Negative unless checked)  Constitutional: [] Weight loss  [] Fever  [] Chills Cardiac: [] Chest pain   [] Chest pressure   [] Palpitations   [] Shortness of breath when laying flat   [] Shortness of breath at rest   [] Shortness of breath with exertion. Vascular:  [] Pain in legs with walking   [] Pain in legs at rest   [] Pain in legs when laying flat   [] Claudication   [] Pain in feet when walking  [] Pain in feet at rest  [] Pain in feet when laying flat   [] History of DVT    [] Phlebitis   [] Swelling in legs   [] Varicose veins   [] Non-healing ulcers Pulmonary:   [] Uses home oxygen   [] Productive cough   [] Hemoptysis   [] Wheeze  [] COPD   [] Asthma Neurologic:  [] Dizziness  [] Blackouts   [] Seizures   [x] History of stroke   [] History of TIA  [] Aphasia   [] Temporary blindness   [] Dysphagia   [] Weakness or numbness in arms   [] Weakness or numbness in legs Musculoskeletal:  [] Arthritis   [] Joint swelling   [] Joint pain   [] Low back pain Hematologic:  [] Easy bruising  [] Easy bleeding   [] Hypercoagulable state   [] Anemic  [] Hepatitis Gastrointestinal:  [] Blood in stool   [] Vomiting blood  [] Gastroesophageal reflux/heartburn   [] Difficulty swallowing. Genitourinary:  [x] Chronic kidney disease   [] Difficult urination  [] Frequent urination  [] Burning with urination   [] Blood in urine Skin:  [] Rashes   [] Ulcers   [] Wounds Psychological:  [] History of anxiety   []  History of major depression.  Physical Examination  There were no vitals filed for this visit. There is no weight on file to calculate BMI. Gen: WD/WN, NAD Head: [place]/AT, No temporalis wasting. Prominent temp pulse not noted. Ear/Nose/Throat: Hearing grossly intact, nares w/o erythema or drainage, oropharynx w/o Erythema/Exudate,  Eyes: PERRLA, EOMI.  Neck: Supple, no nuchal rigidity.  No bruit or JVD.  Pulmonary:  Good air movement, clear to auscultation bilaterally, no use of accessory muscles.  Cardiac: RRR, normal S1, S2, no Murmurs, rubs or gallops. Vascular: thrill and bruit present in av access Vessel Right Left                                       Gastrointestinal: soft, non-tender/non-distended. No guarding/reflex.  Musculoskeletal: Mild LE swelling.  Extremities without ischemic changes.   Neurologic: CN 2-12 intact. Pain and light touch intact in extremities.  Psychiatric: Judgment intact, Mood & affect appropriate for pt's clinical situation. Dermatologic: No rashes or ulcers noted.  No  cellulitis or open wounds. Lymph : No Cervical, Axillary, or Inguinal lymphadenopathy.      CBC Lab Results  Component Value Date   WBC 7.3 05/07/2014   HGB 13.2 05/07/2014   HCT 40.8 05/07/2014   MCV 97.3 05/07/2014   PLT 203 05/07/2014    BMET    Component Value Date/Time   NA 141 05/07/2014 1329   NA 141 04/14/2014 0425   K 3.4* 05/07/2014 1329   K 3.6 04/14/2014 0425   CL 96* 05/07/2014 1329   CL 101 04/14/2014 0425   CO2 35* 05/07/2014 1329   CO2 28 04/14/2014 0425   GLUCOSE 138* 05/07/2014 1329   GLUCOSE 186* 04/14/2014 0425   BUN 11 05/07/2014 1329   BUN 49* 04/14/2014 0425   CREATININE 6.03* 05/07/2014 1329   CREATININE 14.42* 04/14/2014 0425   CALCIUM 8.6* 05/07/2014 1329   CALCIUM 9.6 04/14/2014 0425   GFRNONAA 10* 05/07/2014 1329   GFRNONAA 3* 04/14/2014 0425   GFRAA 11* 05/07/2014 1329   GFRAA 4* 04/14/2014 0425   CrCl cannot be calculated (Unknown ideal weight.).  COAG No results found for: INR, PROTIME    Assessment/Plan 1.  ESRD: long standing.  Needs better function for access 2.  Dysfunction of dialysis access:  Reported by dialysis access center.  Diminished flow.  Will perform fistulagram today for further evaluation. 3.  DM:  Stable.  Management per outpatient meds 4.  HTN: Stable.  Continue outpatient management   DEW,JASON, MD  07/20/2014 9:40 AM

## 2014-08-19 ENCOUNTER — Encounter: Payer: Self-pay | Admitting: Cardiovascular Disease

## 2014-08-19 ENCOUNTER — Encounter (INDEPENDENT_AMBULATORY_CARE_PROVIDER_SITE_OTHER): Payer: Self-pay

## 2014-08-19 ENCOUNTER — Ambulatory Visit (INDEPENDENT_AMBULATORY_CARE_PROVIDER_SITE_OTHER): Payer: Medicare Other | Admitting: Cardiovascular Disease

## 2014-08-19 VITALS — BP 98/64 | HR 109 | Ht 72.0 in | Wt 186.5 lb

## 2014-08-19 DIAGNOSIS — I471 Supraventricular tachycardia, unspecified: Secondary | ICD-10-CM

## 2014-08-19 DIAGNOSIS — R000 Tachycardia, unspecified: Secondary | ICD-10-CM

## 2014-08-19 MED ORDER — HYDRALAZINE HCL 50 MG PO TABS: 50.0000 mg | ORAL_TABLET | Freq: Three times a day (TID) | ORAL | Status: DC

## 2014-08-19 MED ORDER — CARVEDILOL 12.5 MG PO TABS: 12.5000 mg | ORAL_TABLET | Freq: Two times a day (BID) | ORAL | Status: DC

## 2014-08-19 NOTE — Patient Instructions (Signed)
Medication Instructions:  Your physician has recommended you make the following change in your medication:  INCREASE coreg to 12.5mg  twice per day DECREASE hydralazine to  three times a day   Labwork: Your physician recommends that you have labs today: TSH   Testing/Procedures: Your physician has requested that you have an echocardiogram. Echocardiography is a painless test that uses sound waves to create images of your heart. It provides your doctor with information about the size and shape of your heart and how well your heart's chambers and valves are working. This procedure takes approximately one hour. There are no restrictions for this procedure.    Follow-Up: Your physician recommends that you schedule a follow-up appointment with Dr. Elease Hashimoto as needed.    Any Other Special Instructions Will Be Listed Below (If Applicable).  Echocardiogram An echocardiogram, or echocardiography, uses sound waves (ultrasound) to produce an image of your heart. The echocardiogram is simple, painless, obtained within a short period of time, and offers valuable information to your health care provider. The images from an echocardiogram can provide information such as:  Evidence of coronary artery disease (CAD).  Heart size.  Heart muscle function.  Heart valve function.  Aneurysm detection.  Evidence of a past heart attack.  Fluid buildup around the heart.  Heart muscle thickening.  Assess heart valve function. LET [facility] CARE PROVIDER KNOW ABOUT:  Any allergies you have.  All medicines you are taking, including vitamins, herbs, eye drops, creams, and over-the-counter medicines.  Previous problems you or members of your family have had with the use of anesthetics.  Any blood disorders you have.  Previous surgeries you have had.  Medical conditions you have.  Possibility of pregnancy, if this applies. BEFORE THE PROCEDURE  No special preparation is needed. Eat and  drink normally.  PROCEDURE   In order to produce an image of your heart, gel will be applied to your chest and a wand-like tool (transducer) will be moved over your chest. The gel will help transmit the sound waves from the transducer. The sound waves will harmlessly bounce off your heart to allow the heart images to be captured in real-time motion. These images will then be recorded.  You may need an IV to receive a medicine that improves the quality of the pictures. AFTER THE PROCEDURE You may return to your normal schedule including diet, activities, and medicines, unless your health care provider tells you otherwise. Document Released: 12/17/1999 Document Revised: 05/05/2013 Document Reviewed: 08/26/2012 [facility] Patient Information 2015 [place], [place]. This information is not intended to replace advice given to you by your health care provider. Make sure you discuss any questions you have with your health care provider.

## 2014-08-19 NOTE — Progress Notes (Signed)
Cardiology Office Note   Date:  08/19/2014   ID:  Devin Richmond, DOB [birth year], MRN [id number]  PCP:  Devin Shames, MD  Cardiologist:   Devin Mixer, MD   Chief Complaint  Patient presents with  . other    C/o tachycardia. Meds reviewed verbally with pt.   Problem List: 1. Sinus tachycardia 2. Diabetes mellitus - since age 49 3.  End-stage renal disease-on hemodialysis 4. Multiple CVAs 5. HTN     History of Present Illness: Devin Richmond is a 49 y.o. male who presents for evaluation of sinus tachycardia   He has no symptoms of chest pain or shortness of breath. He was told at his dialysis that his heart rate was too fast. He's never had any issues. He has had a stroke and spends most of the time the wheelchair or chair. His heart rate is been fast for months.  Past Medical History  Diagnosis Date  . CVA (cerebral infarction)   . Diabetes mellitus without complication   . Hypertension   . Kidney disease   . Stroke   . Anemia   . GERD (gastroesophageal reflux disease)   . Pancreatitis   . Diabetic retinopathy   . Neuropathy     peripheral  . Hyperlipidemia     Past Surgical History  Procedure Laterality Date  . Fistula placement left upper arm    . Peripheral vascular catheterization Right 07/20/2014    Procedure: A/V Shuntogram/Fistulagram;  Surgeon: Annice Needy, MD;  Location: [facility] INVASIVE CV LAB;  Service: Cardiovascular;  Laterality: Right;  . Peripheral vascular catheterization N/A 07/20/2014    Procedure: A/V Shunt Intervention;  Surgeon: Annice Needy, MD;  Location: [facility] INVASIVE CV LAB;  Service: Cardiovascular;  Laterality: N/A;     Current Outpatient Prescriptions  Medication Sig Dispense Refill  . atorvastatin (LIPITOR) 80 MG tablet Take 80 mg by mouth daily.    Marland Kitchen atropine 1 % ophthalmic solution Place into the left eye 2 (two) times daily at 10 AM and 5 PM.    . cinacalcet (SENSIPAR) 90 MG tablet Take 90 mg by mouth daily.     Marland Kitchen dipyridamole-aspirin (AGGRENOX) 200-25 MG per 12 hr capsule Take 1 capsule by mouth 2 (two) times daily.    . folic acid (FOLVITE) 400 MCG tablet Take 400 mcg by mouth daily.    . insulin aspart (NOVOLOG) 100 UNIT/ML injection Inject 4 Units into the skin 3 (three) times daily before meals.     . insulin detemir (LEVEMIR) 100 UNIT/ML injection Inject 10 Units into the skin at bedtime.     . lidocaine-prilocaine (EMLA) cream Apply 1 application topically as needed.    . meclizine (ANTIVERT) 25 MG tablet Take 25 mg by mouth 3 (three) times daily.     . mirtazapine (REMERON) 30 MG tablet Take 30 mg by mouth at bedtime.    . Multiple Vitamin (MULTIVITAMIN WITH MINERALS) TABS tablet Take 1 tablet by mouth daily.    . ranitidine (ZANTAC) 150 MG capsule Take 150 mg by mouth every evening.    . sevelamer carbonate (RENVELA) 800 MG tablet Take 800 mg by mouth 3 (three) times daily with meals.     No current facility-administered medications for this visit.    Allergies:   Sulfa antibiotics    Social History:  The patient  reports that he has never smoked. He does not have any smokeless tobacco history on file. He reports that he does not drink  alcohol or use illicit drugs.   Family History:  The patient's family history includes Stroke in his father.    ROS:  Please see the history of present illness.    Review of Systems: Constitutional:  denies fever, chills, diaphoresis, appetite change and fatigue.  HEENT: denies photophobia, eye pain, redness, hearing loss, ear pain, congestion, sore throat, rhinorrhea, sneezing, neck pain, neck stiffness and tinnitus.  Respiratory: denies SOB, DOE, cough, chest tightness, and wheezing.  Cardiovascular: denies chest pain, palpitations and leg swelling.  Gastrointestinal: denies nausea, vomiting, abdominal pain, diarrhea, constipation, blood in stool.  Genitourinary: denies dysuria, urgency, frequency, hematuria, flank pain and difficulty urinating.    Musculoskeletal: denies  myalgias, back pain, joint swelling, arthralgias and gait problem.   Skin: denies pallor, rash and wound.  Neurological: denies dizziness, seizures, syncope, weakness, light-headedness, numbness and headaches.   Hematological: denies adenopathy, easy bruising, personal or family bleeding history.  Psychiatric/ Behavioral: denies suicidal ideation, mood changes, confusion, nervousness, sleep disturbance and agitation.       All other systems are reviewed and negative.    PHYSICAL EXAM: VS:  BP 98/64 mmHg  Pulse 109  Ht 6' (1.829 m)  Wt 84.596 kg (186 lb 8 oz)  BMI 25.29 kg/m2 , BMI Body mass index is 25.29 kg/(m^2). GEN: Well nourished, well developed, in no acute distress HEENT: normal Neck: no JVD, carotid bruits, or masses Cardiac: RRR;  Tachy no murmurs, rubs, or gallops,no edema  Respiratory:  clear to auscultation bilaterally, normal work of breathing GI: soft, nontender, nondistended, + BS MS: no deformity or atrophy Skin: warm and dry, no rash Neuro:  Strength and sensation are intact Psych: normal   EKG:  EKG is ordered today. The ekg ordered today demonstrates  Sinus tach at 109.  NS ST abnl .   Recent Labs: 05/07/2014: BUN 11; Creatinine, Ser 6.03*; Hemoglobin 13.2; Platelets 203; Potassium 3.4*; Sodium 141    Lipid Panel    Component Value Date/Time   CHOL 267* 03/08/2013 0445   TRIG 171 03/08/2013 0445   HDL 51 03/08/2013 0445   VLDL 34 03/08/2013 0445   LDLCALC 182* 03/08/2013 0445      Wt Readings from Last 3 Encounters:  08/19/14 84.596 kg (186 lb 8 oz)  07/20/14 87.998 kg (194 lb)  05/07/14 90.266 kg (199 lb)      Other studies Reviewed: Additional studies/ records that were reviewed today include: . Review of the above records demonstrates:    ASSESSMENT AND PLAN:  1.  Sinus tachycardia: The patient presents with sinus tachycardia. Has multiple other medical problems including long-standing diabetes, diabetic  nephropathy with end-stage renal disease, history of essential hypertension. He is on a fairly high dose of hydralazine and a very small dose of carvedilol. I will try to increase his carvedilol to 12.5 g twice a day. We will decrease the hydralazine to 50 mg 3 times a day. We'll get an echocardiogram to ensure that his left ventricle function is normal.  He had labs drawn at [facility] several months ago which were normal. We will draw a TSH.  I will defer further management to his primary medical doctor.   He may need to have some titration of his carvedilol  And / or  hydralazine.  I'll follow up with him on an as-needed basis. Current medicines are reviewed at length with the patient today.  The patient does not have concerns regarding medicines.  The following changes have been made:  no change  Labs/ tests ordered today include:   Orders Placed This Encounter  Procedures  . EKG 12-Lead     Disposition:   FU with me as needed       Demetrus Pavao, Deloris Ping, MD  08/19/2014 10:44 AM    [facility] [address] [place], [place], [place]  [address] Phone: [phone]; Fax: [phone]   [facility]  [address] [place], [place]  [address] [phone]   Fax [phone]

## 2014-08-20 LAB — TSH: TSH: 0.499 u[IU]/mL (ref 0.450–4.500)

## 2014-08-31 ENCOUNTER — Ambulatory Visit (INDEPENDENT_AMBULATORY_CARE_PROVIDER_SITE_OTHER): Payer: Medicare Other

## 2014-08-31 ENCOUNTER — Other Ambulatory Visit: Payer: Self-pay

## 2014-08-31 DIAGNOSIS — I471 Supraventricular tachycardia: Secondary | ICD-10-CM | POA: Diagnosis not present

## 2015-01-29 ENCOUNTER — Other Ambulatory Visit: Payer: Self-pay | Admitting: Internal Medicine

## 2015-01-29 DIAGNOSIS — R319 Hematuria, unspecified: Secondary | ICD-10-CM

## 2015-02-03 ENCOUNTER — Ambulatory Visit
Admission: RE | Admit: 2015-02-03 | Discharge: 2015-02-03 | Disposition: A | Discharge status: Home / Self Care | Payer: Medicare Other | Source: Ambulatory Visit | Attending: Internal Medicine | Admitting: Internal Medicine

## 2015-02-03 DIAGNOSIS — R319 Hematuria, unspecified: Secondary | ICD-10-CM | POA: Diagnosis present

## 2015-02-03 DIAGNOSIS — N400 Enlarged prostate without lower urinary tract symptoms: Secondary | ICD-10-CM | POA: Insufficient documentation

## 2015-06-24 ENCOUNTER — Other Ambulatory Visit: Payer: Self-pay

## 2015-06-24 ENCOUNTER — Emergency Department: Payer: Medicare Other

## 2015-06-24 ENCOUNTER — Emergency Department
Admission: EM | Admit: 2015-06-24 | Discharge: 2015-06-24 | Disposition: A | Discharge status: Home / Self Care | Payer: Medicare Other | Attending: Emergency Medicine | Admitting: Emergency Medicine

## 2015-06-24 DIAGNOSIS — I10 Essential (primary) hypertension: Secondary | ICD-10-CM | POA: Insufficient documentation

## 2015-06-24 DIAGNOSIS — Z8673 Personal history of transient ischemic attack (TIA), and cerebral infarction without residual deficits: Secondary | ICD-10-CM | POA: Diagnosis not present

## 2015-06-24 DIAGNOSIS — M791 Myalgia, unspecified site: Secondary | ICD-10-CM

## 2015-06-24 DIAGNOSIS — Z5181 Encounter for therapeutic drug level monitoring: Secondary | ICD-10-CM | POA: Insufficient documentation

## 2015-06-24 DIAGNOSIS — Z79899 Other long term (current) drug therapy: Secondary | ICD-10-CM | POA: Insufficient documentation

## 2015-06-24 DIAGNOSIS — R509 Fever, unspecified: Secondary | ICD-10-CM | POA: Diagnosis not present

## 2015-06-24 DIAGNOSIS — E785 Hyperlipidemia, unspecified: Secondary | ICD-10-CM | POA: Insufficient documentation

## 2015-06-24 DIAGNOSIS — Z794 Long term (current) use of insulin: Secondary | ICD-10-CM | POA: Diagnosis not present

## 2015-06-24 DIAGNOSIS — E119 Type 2 diabetes mellitus without complications: Secondary | ICD-10-CM | POA: Insufficient documentation

## 2015-06-24 LAB — COMPREHENSIVE METABOLIC PANEL
ALT: 19 U/L (ref 17–63)
AST: 18 U/L (ref 15–41)
Albumin: 4.3 g/dL (ref 3.5–5.0)
Alkaline Phosphatase: 194 U/L — ABNORMAL HIGH (ref 38–126)
Anion gap: 12 (ref 5–15)
BILIRUBIN TOTAL: 1.1 mg/dL (ref 0.3–1.2)
BUN: 17 mg/dL (ref 6–20)
CHLORIDE: 89 mmol/L — AB (ref 101–111)
CO2: 34 mmol/L — ABNORMAL HIGH (ref 22–32)
CREATININE: 7.34 mg/dL — AB (ref 0.61–1.24)
Calcium: 10.3 mg/dL (ref 8.9–10.3)
GFR, EST AFRICAN AMERICAN: 9 mL/min — AB (ref >60)
GFR, EST NON AFRICAN AMERICAN: 8 mL/min — AB (ref >60)
Glucose, Bld: 265 mg/dL — ABNORMAL HIGH (ref 65–99)
POTASSIUM: 5.4 mmol/L — AB (ref 3.5–5.1)
Sodium: 135 mmol/L (ref 135–145)
TOTAL PROTEIN: 9.9 g/dL — AB (ref 6.5–8.1)

## 2015-06-24 LAB — CBC WITH DIFFERENTIAL/PLATELET
BASOS ABS: 0.1 10*3/uL (ref 0–0.1)
EOS ABS: 0.1 10*3/uL (ref 0–0.7)
HCT: 41.4 % (ref 40.0–52.0)
HEMOGLOBIN: 14 g/dL (ref 13.0–18.0)
Lymphocytes Relative: 8 %
Lymphs Abs: 0.8 10*3/uL — ABNORMAL LOW (ref 1.0–3.6)
MCH: 32.4 pg (ref 26.0–34.0)
MCHC: 33.9 g/dL (ref 32.0–36.0)
MCV: 95.8 fL (ref 80.0–100.0)
Monocytes Absolute: 0.4 10*3/uL (ref 0.2–1.0)
Monocytes Relative: 4 %
Neutro Abs: 9 10*3/uL — ABNORMAL HIGH (ref 1.4–6.5)
PLATELETS: 191 10*3/uL (ref 150–440)
RBC: 4.32 MIL/uL — AB (ref 4.40–5.90)
RDW: 16.1 % — ABNORMAL HIGH (ref 11.5–14.5)
WBC: 10.4 10*3/uL (ref 3.8–10.6)

## 2015-06-24 LAB — APTT: APTT: 33 s (ref 24–36)

## 2015-06-24 LAB — PROTIME-INR
INR: 1.13
PROTHROMBIN TIME: 14.7 s (ref 11.4–15.0)

## 2015-06-24 LAB — LIPASE, BLOOD: LIPASE: 38 U/L (ref 11–51)

## 2015-06-24 LAB — LACTIC ACID, PLASMA: LACTIC ACID, VENOUS: 1.7 mmol/L (ref 0.5–2.0)

## 2015-06-24 LAB — TROPONIN I

## 2015-06-24 MED ORDER — SODIUM CHLORIDE 0.9 % IV BOLUS (SEPSIS): 1000.0000 mL | Freq: Once | INTRAVENOUS | Status: DC

## 2015-06-24 MED ORDER — ACETAMINOPHEN 325 MG PO TABS
ORAL_TABLET | ORAL | Status: AC
  Administered 2015-06-24: 650 mg via ORAL
  Filled 2015-06-24: qty 2

## 2015-06-24 MED ORDER — VANCOMYCIN HCL IN DEXTROSE 1-5 GM/200ML-% IV SOLN
1000.0000 mg | Freq: Once | INTRAVENOUS | Status: AC
  Administered 2015-06-24: 1000 mg via INTRAVENOUS
  Filled 2015-06-24: qty 200

## 2015-06-24 MED ORDER — SODIUM CHLORIDE 0.9 % IV BOLUS (SEPSIS)
1000.0000 mL | Freq: Once | INTRAVENOUS | Status: AC
  Administered 2015-06-24: 1000 mL via INTRAVENOUS

## 2015-06-24 MED ORDER — ACETAMINOPHEN 325 MG PO TABS
650.0000 mg | ORAL_TABLET | Freq: Once | ORAL | Status: AC | PRN
  Administered 2015-06-24: 650 mg via ORAL

## 2015-06-24 MED ORDER — PIPERACILLIN-TAZOBACTAM 3.375 G IVPB 30 MIN
3.3750 g | Freq: Once | INTRAVENOUS | Status: AC
  Administered 2015-06-24: 3.375 g via INTRAVENOUS
  Filled 2015-06-24: qty 50

## 2015-06-24 NOTE — ED Provider Notes (Signed)
[facility] Emergency Department Provider Note  ____________________________________________  Time seen: Approximately 4:43 PM  I have reviewed the triage vital signs and the nursing notes.   HISTORY  Chief Complaint Torticollis and Fever    HPI Devin Richmond is a 50 y.o. male with extensive past medical history including end-stage renal disease on dialysis Tuesday, Thursday, and Saturday, at least 4 prior CVAs, diabetes, hypertension, pancreatitis, and prior cocaine history who presents with acute onset of bilateral shoulder pain.  He states that he had his full course of dialysis today and that he had R he left dialysis when his shoulders began hurting.  He had originally described it as neck pain, but he clarified with me that it is not his neck that hurts but the tops of his shoulders on both sides and that the pain ends at the base of his neck.  He has no stiffness and no difficulty with rotating his head side to side or flexing and extending his head and neck.  He denies headache.  He denies fever/chills, chest pain, shortness of breath, nausea, vomiting, abdominal pain.  He still produces some urine but not much and he denies dysuria.  When he came to triage she was found to have a low-grade fever of 100.6, a pulse of 127, respiratory rate of 22, and relatively low blood pressure of 91/55, but as noted above he did just complete a full course of dialysis.  When asked multiple times he states consistent that he has felt well up until the completion of dialysis.  He describes the pain in the top of his shoulders as aching and moderate in intensity.  Shrugging his shoulders makes it worse and is slightly tender to palpation.  Rest makes it a little bit better.   Past Medical History  Diagnosis Date  . CVA (cerebral infarction)   . Diabetes mellitus without complication ([name])   . Hypertension   . Kidney disease   . Stroke ([name])   . Anemia   . GERD  (gastroesophageal reflux disease)   . Pancreatitis   . Diabetic retinopathy ([name])   . Neuropathy ([name])     peripheral  . Hyperlipidemia     Patient Active Problem List   Diagnosis Date Noted  . Sinus tachycardia ([name]) 08/19/2014    Past Surgical History  Procedure Laterality Date  . Fistula placement left upper arm    . Peripheral vascular catheterization Right 07/20/2014    Procedure: A/V Shuntogram/Fistulagram;  Surgeon: Annice Needy, MD;  Location: [facility] INVASIVE CV LAB;  Service: Cardiovascular;  Laterality: Right;  . Peripheral vascular catheterization N/A 07/20/2014    Procedure: A/V Shunt Intervention;  Surgeon: Annice Needy, MD;  Location: [facility] INVASIVE CV LAB;  Service: Cardiovascular;  Laterality: N/A;    Current Outpatient Rx  Name  Route  Sig  Dispense  Refill  . atorvastatin (LIPITOR) 80 MG tablet   Oral   Take 80 mg by mouth daily.         Marland Kitchen atropine 1 % ophthalmic solution   Left Eye   Place into the left eye 2 (two) times daily at 10 AM and 5 PM.         . carvedilol (COREG) 12.5 MG tablet   Oral   Take 1 tablet (12.5 mg total) by mouth 2 (two) times daily.   60 tablet   11   . cinacalcet (SENSIPAR) 90 MG tablet   Oral   Take 90  mg by mouth daily.         Marland Kitchen. dipyridamole-aspirin (AGGRENOX) 200-25 MG per 12 hr capsule   Oral   Take 1 capsule by mouth 2 (two) times daily.         . folic acid (FOLVITE) 400 MCG tablet   Oral   Take 400 mcg by mouth daily.         . hydrALAZINE (APRESOLINE) 50 MG tablet   Oral   Take 1 tablet (50 mg total) by mouth 3 (three) times daily.   90 tablet   11   . insulin aspart (NOVOLOG) 100 UNIT/ML injection   Subcutaneous   Inject 4 Units into the skin 3 (three) times daily before meals.          . insulin detemir (LEVEMIR) 100 UNIT/ML injection   Subcutaneous   Inject 10 Units into the skin at bedtime.          . lidocaine-prilocaine (EMLA) cream   Topical   Apply 1 application topically as needed.          . meclizine (ANTIVERT) 25 MG tablet   Oral   Take 25 mg by mouth 3 (three) times daily.          . mirtazapine (REMERON) 30 MG tablet   Oral   Take 30 mg by mouth at bedtime.         . Multiple Vitamin (MULTIVITAMIN WITH MINERALS) TABS tablet   Oral   Take 1 tablet by mouth daily.         . ranitidine (ZANTAC) 150 MG capsule   Oral   Take 150 mg by mouth every evening.         . sevelamer carbonate (RENVELA) 800 MG tablet   Oral   Take 800 mg by mouth 3 (three) times daily with meals.           Allergies Sulfa antibiotics  Family History  Problem Relation Age of Onset  . Stroke Father     Social History Social History  Substance Use Topics  . Smoking status: Never Smoker   . Smokeless tobacco: None  . Alcohol Use: No    Review of Systems Constitutional: No fever/chills Eyes: No visual changes. ENT: No sore throat. Cardiovascular: Denies chest pain. Respiratory: Denies shortness of breath. Gastrointestinal: No abdominal pain.  No nausea, no vomiting.  No diarrhea.  No constipation. Genitourinary: Negative for dysuria. Musculoskeletal: Negative for back pain. Denies neck pain and stiffness.  Has bilateral upper shoulder pain and tenderness Skin: Negative for rash. Neurological: Negative for headaches, focal weakness or numbness.  10-point ROS otherwise negative.  ____________________________________________   PHYSICAL EXAM:  VITAL SIGNS: ED Triage Vitals  Enc Vitals Group     BP 06/24/15 1509 91/55 mmHg     Pulse Rate 06/24/15 1509 127     Resp 06/24/15 1509 22     Temp 06/24/15 1504 100.6 F (38.1 C)     Temp Source 06/24/15 1504 Oral     SpO2 06/24/15 1509 98 %     Weight 06/24/15 1509 186 lb (84.369 kg)     Height --      Head Cir --      Peak Flow --      Pain Score 06/24/15 1510 8     Pain Loc --      Pain Edu? --      Excl. in GC? --     Constitutional: Alert and oriented. Well  appearing and in no acute  distress. Eyes: Conjunctivae are normal. PERRL. EOMI. Head: Atraumatic. Nose: No congestion/rhinnorhea. Mouth/Throat: Mucous membranes are moist.  Oropharynx non-erythematous. Neck: No stridor.  No meningeal signs.  Full, non-tender ROM of head and neck.  No cervical spine tenderness to palpation.  Tenderness to palpation of the muscles of the proximal and medial shoulders without any evidence of swelling, erythema, or other gross deformity. Cardiovascular: Normal rate, regular rhythm. Good peripheral circulation. Grossly normal heart sounds.  Dialysis access with normal thrill Respiratory: Normal respiratory effort.  No retractions. Lungs CTAB. Gastrointestinal: Soft and nontender. No distention.  Musculoskeletal: No lower extremity tenderness nor edema. No gross deformities of extremities. Neurologic:  Normal speech and language. No gross focal neurologic deficits are appreciated.  Skin:  Skin is warm, dry and intact. No rash noted. Psychiatric: Mood and affect are normal. Speech and behavior are normal.  ____________________________________________   LABS (all labs ordered are listed, but only abnormal results are displayed)  Labs Reviewed  COMPREHENSIVE METABOLIC PANEL - Abnormal; Notable for the following:    Potassium 5.4 (*)    Chloride 89 (*)    CO2 34 (*)    Glucose, Bld 265 (*)    Creatinine, Ser 7.34 (*)    Total Protein 9.9 (*)    Alkaline Phosphatase 194 (*)    GFR calc non Af Amer 8 (*)    GFR calc Af Amer 9 (*)    All other components within normal limits  CBC WITH DIFFERENTIAL/PLATELET - Abnormal; Notable for the following:    RBC 4.32 (*)    RDW 16.1 (*)    Neutro Abs 9.0 (*)    Lymphs Abs 0.8 (*)    All other components within normal limits  CULTURE, BLOOD (ROUTINE X 2)  CULTURE, BLOOD (ROUTINE X 2)  LACTIC ACID, PLASMA  LIPASE, BLOOD  TROPONIN I  APTT  PROTIME-INR   ____________________________________________  EKG  ED ECG REPORT I, Gerell Fortson,  the attending physician, personally viewed and interpreted this ECG.  Date: 06/24/2015 EKG Time: 17:09 Rate: 102 Rhythm: Sinus tachycardia QRS Axis: normal Intervals: normal ST/T Wave abnormalities: 1-mm in leads V2 and V3, no reciprocal changes, likely early repol Conduction Disturbances: none Narrative Interpretation: unremarkable  ____________________________________________  RADIOLOGY   Dg Chest Port 1 View  06/24/2015  CLINICAL DATA:  Worsening neck swelling and pain radiating across both shoulders, history stroke, diabetes mellitus, kidney disease, hypertension, pancreatitis EXAM: PORTABLE CHEST 1 VIEW COMPARISON:  Portable exam 1603 hours compared to 04/14/2014 FINDINGS: Normal heart size, mediastinal contours, and pulmonary vascularity. Lungs clear. No pleural effusion or pneumothorax. Bones unremarkable. IMPRESSION: No acute abnormalities. Electronically Signed   By: Ulyses Southward M.D.   On: 06/24/2015 16:17    ____________________________________________   PROCEDURES  Procedure(s) performed:   Procedures   ____________________________________________   INITIAL IMPRESSION / ASSESSMENT AND PLAN / ED COURSE  Pertinent labs & imaging results that were available during my care of the patient were reviewed by me and considered in my medical decision making (see chart for details).  The patient has numerous chronic medical problems but does not appear to be in acute distress at this time in spite of his abnormal vital signs.  His hypertension and tachycardia may be the result of having just finished dialysis including volume depleted, but he still also has a low-grade fever of 100.6.  He is in no acute respiratory distress in spite of his very slight tachypnea.  Given that he  meets sepsis criteria, I have made him a code sepsis and will give empiric antibiotics and start fluid hydration until or last week get back the results of his lactate.  I strongly doubt the clinical  necessity to give him 3 L of fluid (30 mL/kg) but I will begin the process as per hospital protocol.  He is at his neurological/psychiatric baseline according to the caregiver from his living facility and he is in no acute distress.  There is absolutely no evidence of meningitis at this time and based on the acute onset of the symptoms, lack of headache, and no neurological deficits, I would put meningitis down at the bottom of my differential diagnosis.  ----------------------------------------- 7:36 PM on 06/24/2015 -----------------------------------------  Patient's pain has completely resolved after Tylenol.  Afebrile.  Vitals improved.  Received less than 1L of fluid.  Lactate normal.  No leukocytosis.  Patient ate a full meal, feels fine.  No abnormalities requiring admission.  Did receive empiric antibiotics.  Still has no neck pain, tenderness, stiffness, nor headache.  Will follow up as outpatient.  Group home understands and agrees with plan. ____________________________________________  FINAL CLINICAL IMPRESSION(S) / ED DIAGNOSES  Final diagnoses:  Muscle soreness  Fever, unspecified fever cause     MEDICATIONS GIVEN DURING THIS VISIT:  Medications  acetaminophen (TYLENOL) tablet 650 mg (650 mg Oral Given 06/24/15 1515)  sodium chloride 0.9 % bolus 1,000 mL (1,000 mLs Intravenous New Bag/Given 06/24/15 1724)  piperacillin-tazobactam (ZOSYN) IVPB 3.375 g (0 g Intravenous Stopped 06/24/15 1759)  vancomycin (VANCOCIN) IVPB 1000 mg/200 mL premix (0 mg Intravenous Stopped 06/24/15 1858)     NEW OUTPATIENT MEDICATIONS STARTED DURING THIS VISIT:  New Prescriptions   No medications on file      Note:  This document was prepared using Dragon voice recognition software and may include unintentional dictation errors.   Loleta Roseory Avacyn Kloosterman, MD 06/24/15 1945

## 2015-06-24 NOTE — ED Notes (Signed)
This RN spoke with Inocencio HomesGayle [phone], reports will be here in about 30 min.

## 2015-06-24 NOTE — Discharge Instructions (Signed)
[name] had bilateral muscle soreness in his shoulders that resolved in the Emergency Department (ED).  He initially had a low-grade fever of 100.6, but that resolved as well.  His labs are unremarkable with no leukocytosis (elevated white blood cell count) and he ate a full meal without any difficulty.  We find no significant abnormalities on his workup and nothing which requires admission.  Please follow up with his regular doctor tomorrow to schedule the next available clinic appointment.  If he develops new or worsening symptoms that concern you, please return immediately to the Emergency Department.   Fever, Adult A fever is an increase in the body's temperature. It is often defined as a temperature of 100 F (38C) or higher. Short mild or moderate fevers often have no long-term effects. They also often do not need treatment. Moderate or high fevers may make you feel uncomfortable. Sometimes, they can also be a sign of a serious illness or disease. The sweating that may happen with repeated fevers or fevers that last a while may also cause you to not have enough fluid in your body (dehydration). You can take your temperature with a thermometer to see if you have a fever. A measured temperature can change with:  Age.  Time of day.  Where the thermometer is placed:  Mouth (oral).  Rectum (rectal).  Ear (tympanic).  Underarm (axillary).  Forehead (temporal). HOME CARE Pay attention to any changes in your symptoms. Take these actions to help with your condition:  Take over-the-counter and prescription medicines only as told by your doctor. Follow the dosing instructions carefully.  If you were prescribed an antibiotic medicine, take it as told by your doctor. Do not stop taking the antibiotic even if you start to feel better.  Rest as needed.  Drink enough fluid to keep your pee (urine) clear or pale yellow.  Sponge yourself or bathe with room-temperature water as needed. This  helps to lower your body temperature . Do not use ice water.  Do not wear too many blankets or heavy clothes. GET HELP IF:  You throw up (vomit).  You cannot eat or drink without throwing up.  You have watery poop (diarrhea).  It hurts when you pee.  Your symptoms do not get better with treatment.  You have new symptoms.  You feel very weak. GET HELP RIGHT AWAY IF:  You are short of breath or have trouble breathing.  You are dizzy or you pass out (faint).  You feel confused.  You have signs of not having enough fluid in your body, such as:  A dry mouth.  Peeing less.  Looking pale.  You have very bad pain in your belly (abdomen).  You keep throwing up or having water poop.  You have a skin rash.  Your symptoms suddenly get worse.   This information is not intended to replace advice given to you by your health care provider. Make sure you discuss any questions you have with your health care provider.   Document Released: 09/28/2007 Document Revised: 09/09/2014 Document Reviewed: 02/12/2014 Elsevier Interactive Patient Education 2016 Elsevier Inc.  Muscle Pain, Adult Muscle pain (myalgia) may be caused by many things, including:  Overuse or muscle strain, especially if you are not in shape. This is the most common cause of muscle pain.  Injury.  Bruises.  Viruses, such as the flu.  Infectious diseases.  Fibromyalgia, which is a chronic condition that causes muscle tenderness, fatigue, and headache.  Autoimmune diseases, including  lupus.  Certain drugs, including ACE inhibitors and statins. Muscle pain may be mild or severe. In most cases, the pain lasts only a short time and goes away without treatment. To diagnose the cause of your muscle pain, your health care provider will take your medical history. This means he or she will ask you when your muscle pain began and what has been happening. If you have not had muscle pain for very long, your health  care provider may want to wait before doing much testing. If your muscle pain has lasted a long time, your health care provider may want to run tests right away. If your health care provider thinks your muscle pain may be caused by illness, you may need to have additional tests to rule out certain conditions.  Treatment for muscle pain depends on the cause. Home care is often enough to relieve muscle pain. Your health care provider may also prescribe anti-inflammatory medicine. HOME CARE INSTRUCTIONS Watch your condition for any changes. The following actions may help to lessen any discomfort you are feeling:  Only take over-the-counter or prescription medicines as directed by your health care provider.  Apply ice to the sore muscle:  Put ice in a plastic bag.  Place a towel between your skin and the bag.  Leave the ice on for 15-20 minutes, 3-4 times a day.  You may alternate applying hot and cold packs to the muscle as directed by your health care provider.  If overuse is causing your muscle pain, slow down your activities until the pain goes away.  Remember that it is normal to feel some muscle pain after starting a workout program. Muscles that have not been used often will be sore at first.  Do regular, gentle exercises if you are not usually active.  Warm up before exercising to lower your risk of muscle pain.  Do not continue working out if the pain is very bad. Bad pain could mean you have injured a muscle. SEEK MEDICAL CARE IF:  Your muscle pain gets worse, and medicines do not help.  You have muscle pain that lasts longer than 3 days.  You have a rash or fever along with muscle pain.  You have muscle pain after a tick bite.  You have muscle pain while working out, even though you are in good physical condition.  You have redness, soreness, or swelling along with muscle pain.  You have muscle pain after starting a new medicine or changing the dose of a medicine. SEEK  IMMEDIATE MEDICAL CARE IF:  You have trouble breathing.  You have trouble swallowing.  You have muscle pain along with a stiff neck, fever, and vomiting.  You have severe muscle weakness or cannot move part of your body. MAKE SURE YOU:   Understand these instructions.  Will watch your condition.  Will get help right away if you are not doing well or get worse.   This information is not intended to replace advice given to you by your health care provider. Make sure you discuss any questions you have with your health care provider.   Document Released: 11/10/2005 Document Revised: 01/09/2014 Document Reviewed: 10/15/2012 Elsevier Interactive Patient Education [employer]2016 Elsevier Inc.

## 2015-06-24 NOTE — ED Notes (Signed)
Patient is dialysis patient. MD does not want to overload with fluid. Began with at 500cc/hr.

## 2015-06-24 NOTE — ED Notes (Signed)
Pain to base of neck at both shoulders. Patient can turn head and flex and extend without complication. Has hx of 5 strokes, dialysis, diabetes. He is a resident of [facility] [place].

## 2015-06-24 NOTE — ED Notes (Signed)
[facility]/o bilateral neck pain, denies stiffness. Pt denies any other sx of any other pain. Pt alert and oriented X4, active, cooperative, pt in NAD. RR even and unlabored, color WNL.

## 2015-06-24 NOTE — ED Notes (Signed)
Patient ate [place] sandwich meal. Tolerated well. Resting quietly, watching TV. Staff from group home left but another staff member will pick him up if discharged. Her info is on board in room.

## 2015-06-25 LAB — BLOOD CULTURE ID PANEL (REFLEXED)
Acinetobacter baumannii: NOT DETECTED
CANDIDA ALBICANS: NOT DETECTED
CANDIDA GLABRATA: NOT DETECTED
CANDIDA PARAPSILOSIS: NOT DETECTED
CANDIDA TROPICALIS: NOT DETECTED
Candida krusei: NOT DETECTED
Carbapenem resistance: NOT DETECTED
ENTEROBACTER CLOACAE COMPLEX: NOT DETECTED
ENTEROBACTERIACEAE SPECIES: NOT DETECTED
ESCHERICHIA COLI: NOT DETECTED
Enterococcus species: NOT DETECTED
Haemophilus influenzae: NOT DETECTED
KLEBSIELLA OXYTOCA: NOT DETECTED
KLEBSIELLA PNEUMONIAE: NOT DETECTED
Listeria monocytogenes: NOT DETECTED
Methicillin resistance: NOT DETECTED
Neisseria meningitidis: NOT DETECTED
PROTEUS SPECIES: NOT DETECTED
PSEUDOMONAS AERUGINOSA: NOT DETECTED
STAPHYLOCOCCUS AUREUS BCID: NOT DETECTED
STREPTOCOCCUS AGALACTIAE: NOT DETECTED
STREPTOCOCCUS PNEUMONIAE: NOT DETECTED
Serratia marcescens: NOT DETECTED
Staphylococcus species: NOT DETECTED
Streptococcus pyogenes: NOT DETECTED
Streptococcus species: DETECTED — AB
VANCOMYCIN RESISTANCE: NOT DETECTED

## 2015-06-25 NOTE — Progress Notes (Signed)
50 y/o M with ESRD on HD presented to ED with torticollis and fever and r/o sepsis but symptoms resolved after APAP and fluid bolus. 1/2 BCx with rapid ID reporting Streptococcus species. After discussion with Dr. Mayford KnifeWilliams in ED, will continue to follow to make sure second set of blood cultures remains negative but no need for abx at this point as likely contamination.   Luisa HartScott Jerel Sardina, PharmD Clinical Pharmacist

## 2015-06-27 LAB — CULTURE, BLOOD (ROUTINE X 2)

## 2015-06-29 LAB — CULTURE, BLOOD (ROUTINE X 2): CULTURE: NO GROWTH

## 2015-07-07 ENCOUNTER — Other Ambulatory Visit: Payer: Self-pay | Admitting: Vascular Surgery

## 2015-07-11 ENCOUNTER — Other Ambulatory Visit
Admission: AD | Admit: 2015-07-11 | Discharge: 2015-07-11 | Disposition: A | Discharge status: Home / Self Care | Payer: Medicare Other | Source: Ambulatory Visit | Attending: Vascular Surgery | Admitting: Vascular Surgery

## 2015-07-11 DIAGNOSIS — Z01812 Encounter for preprocedural laboratory examination: Secondary | ICD-10-CM | POA: Insufficient documentation

## 2015-07-11 LAB — POTASSIUM: Potassium: 4.6 mmol/L (ref 3.5–5.1)

## 2015-07-12 ENCOUNTER — Encounter
Admission: RE | Disposition: A | Discharge status: Home / Self Care | Payer: Self-pay | Source: Ambulatory Visit | Attending: Vascular Surgery

## 2015-07-12 ENCOUNTER — Encounter: Payer: Self-pay | Admitting: *Deleted

## 2015-07-12 ENCOUNTER — Ambulatory Visit
Admission: RE | Admit: 2015-07-12 | Discharge: 2015-07-12 | Disposition: A | Discharge status: Home / Self Care | Payer: Medicare Other | Source: Ambulatory Visit | Attending: Vascular Surgery | Admitting: Vascular Surgery

## 2015-07-12 DIAGNOSIS — G629 Polyneuropathy, unspecified: Secondary | ICD-10-CM | POA: Diagnosis not present

## 2015-07-12 DIAGNOSIS — Z8249 Family history of ischemic heart disease and other diseases of the circulatory system: Secondary | ICD-10-CM | POA: Insufficient documentation

## 2015-07-12 DIAGNOSIS — Z823 Family history of stroke: Secondary | ICD-10-CM | POA: Insufficient documentation

## 2015-07-12 DIAGNOSIS — Z794 Long term (current) use of insulin: Secondary | ICD-10-CM | POA: Diagnosis not present

## 2015-07-12 DIAGNOSIS — N2581 Secondary hyperparathyroidism of renal origin: Secondary | ICD-10-CM | POA: Diagnosis not present

## 2015-07-12 DIAGNOSIS — D649 Anemia, unspecified: Secondary | ICD-10-CM | POA: Diagnosis not present

## 2015-07-12 DIAGNOSIS — L97524 Non-pressure chronic ulcer of other part of left foot with necrosis of bone: Secondary | ICD-10-CM | POA: Diagnosis not present

## 2015-07-12 DIAGNOSIS — E10621 Type 1 diabetes mellitus with foot ulcer: Secondary | ICD-10-CM | POA: Insufficient documentation

## 2015-07-12 DIAGNOSIS — I120 Hypertensive chronic kidney disease with stage 5 chronic kidney disease or end stage renal disease: Secondary | ICD-10-CM | POA: Diagnosis not present

## 2015-07-12 DIAGNOSIS — Z8673 Personal history of transient ischemic attack (TIA), and cerebral infarction without residual deficits: Secondary | ICD-10-CM | POA: Insufficient documentation

## 2015-07-12 DIAGNOSIS — I70268 Atherosclerosis of native arteries of extremities with gangrene, other extremity: Secondary | ICD-10-CM | POA: Diagnosis present

## 2015-07-12 DIAGNOSIS — Z833 Family history of diabetes mellitus: Secondary | ICD-10-CM | POA: Insufficient documentation

## 2015-07-12 DIAGNOSIS — Z809 Family history of malignant neoplasm, unspecified: Secondary | ICD-10-CM | POA: Diagnosis not present

## 2015-07-12 DIAGNOSIS — E10319 Type 1 diabetes mellitus with unspecified diabetic retinopathy without macular edema: Secondary | ICD-10-CM | POA: Diagnosis not present

## 2015-07-12 DIAGNOSIS — Z79899 Other long term (current) drug therapy: Secondary | ICD-10-CM | POA: Insufficient documentation

## 2015-07-12 DIAGNOSIS — M6281 Muscle weakness (generalized): Secondary | ICD-10-CM | POA: Insufficient documentation

## 2015-07-12 DIAGNOSIS — Z992 Dependence on renal dialysis: Secondary | ICD-10-CM | POA: Diagnosis not present

## 2015-07-12 DIAGNOSIS — N186 End stage renal disease: Secondary | ICD-10-CM | POA: Diagnosis not present

## 2015-07-12 DIAGNOSIS — E1022 Type 1 diabetes mellitus with diabetic chronic kidney disease: Secondary | ICD-10-CM | POA: Insufficient documentation

## 2015-07-12 DIAGNOSIS — K859 Acute pancreatitis without necrosis or infection, unspecified: Secondary | ICD-10-CM | POA: Diagnosis not present

## 2015-07-12 DIAGNOSIS — R809 Proteinuria, unspecified: Secondary | ICD-10-CM | POA: Diagnosis not present

## 2015-07-12 DIAGNOSIS — K210 Gastro-esophageal reflux disease with esophagitis: Secondary | ICD-10-CM | POA: Insufficient documentation

## 2015-07-12 DIAGNOSIS — M2550 Pain in unspecified joint: Secondary | ICD-10-CM | POA: Diagnosis not present

## 2015-07-12 HISTORY — PX: PERIPHERAL VASCULAR CATHETERIZATION: SHX172C

## 2015-07-12 LAB — BASIC METABOLIC PANEL
Anion gap: 11 (ref 5–15)
BUN: 27 mg/dL — AB (ref 6–20)
CALCIUM: 8.6 mg/dL — AB (ref 8.9–10.3)
CHLORIDE: 95 mmol/L — AB (ref 101–111)
CO2: 33 mmol/L — ABNORMAL HIGH (ref 22–32)
CREATININE: 10.17 mg/dL — AB (ref 0.61–1.24)
GFR, EST AFRICAN AMERICAN: 6 mL/min — AB (ref >60)
GFR, EST NON AFRICAN AMERICAN: 5 mL/min — AB (ref >60)
Glucose, Bld: 178 mg/dL — ABNORMAL HIGH (ref 65–99)
Potassium: 4.4 mmol/L (ref 3.5–5.1)
SODIUM: 139 mmol/L (ref 135–145)

## 2015-07-12 SURGERY — LOWER EXTREMITY ANGIOGRAPHY
Anesthesia: Moderate Sedation | Site: Leg Lower | Laterality: Left

## 2015-07-12 MED ORDER — METHYLPREDNISOLONE SODIUM SUCC 125 MG IJ SOLR: 125.0000 mg | INTRAMUSCULAR | Status: DC | PRN

## 2015-07-12 MED ORDER — FENTANYL CITRATE (PF) 100 MCG/2ML IJ SOLN
INTRAMUSCULAR | Status: AC
  Filled 2015-07-12: qty 2

## 2015-07-12 MED ORDER — LIDOCAINE-EPINEPHRINE (PF) 1 %-1:200000 IJ SOLN
INTRAMUSCULAR | Status: AC
  Filled 2015-07-12: qty 30

## 2015-07-12 MED ORDER — SODIUM CHLORIDE 0.9 % IV SOLN
INTRAVENOUS | Status: DC
  Administered 2015-07-12: 10:00:00 via INTRAVENOUS

## 2015-07-12 MED ORDER — LIDOCAINE-EPINEPHRINE (PF) 1 %-1:200000 IJ SOLN
INTRAMUSCULAR | Status: DC | PRN
  Administered 2015-07-12: 10 mL via INTRADERMAL

## 2015-07-12 MED ORDER — HEPARIN SODIUM (PORCINE) 1000 UNIT/ML IJ SOLN
INTRAMUSCULAR | Status: AC
  Filled 2015-07-12: qty 1

## 2015-07-12 MED ORDER — MIDAZOLAM HCL 2 MG/2ML IJ SOLN
INTRAMUSCULAR | Status: DC | PRN
  Administered 2015-07-12: 1 mg via INTRAVENOUS
  Administered 2015-07-12: 2 mg via INTRAVENOUS
  Administered 2015-07-12: 1 mg via INTRAVENOUS

## 2015-07-12 MED ORDER — HEPARIN (PORCINE) IN NACL 2-0.9 UNIT/ML-% IJ SOLN
INTRAMUSCULAR | Status: AC
  Filled 2015-07-12: qty 1000

## 2015-07-12 MED ORDER — DEXTROSE 5 % IV SOLN
1.5000 g | INTRAVENOUS | Status: AC
  Administered 2015-07-12: 1.5 g via INTRAVENOUS

## 2015-07-12 MED ORDER — ONDANSETRON HCL 4 MG/2ML IJ SOLN: 4.0000 mg | Freq: Four times a day (QID) | INTRAMUSCULAR | Status: DC | PRN

## 2015-07-12 MED ORDER — IOPAMIDOL (ISOVUE-300) INJECTION 61%
INTRAVENOUS | Status: DC | PRN
Start: 2015-07-12 — End: 2015-07-12
  Administered 2015-07-12: 90 mL via INTRA_ARTERIAL

## 2015-07-12 MED ORDER — MIDAZOLAM HCL 5 MG/5ML IJ SOLN
INTRAMUSCULAR | Status: AC
  Filled 2015-07-12: qty 5

## 2015-07-12 MED ORDER — HYDROMORPHONE HCL 1 MG/ML IJ SOLN: 1.0000 mg | Freq: Once | INTRAMUSCULAR | Status: DC

## 2015-07-12 MED ORDER — FAMOTIDINE 20 MG PO TABS: 40.0000 mg | ORAL_TABLET | ORAL | Status: DC | PRN

## 2015-07-12 MED ORDER — HEPARIN SODIUM (PORCINE) 1000 UNIT/ML IJ SOLN
INTRAMUSCULAR | Status: DC | PRN
  Administered 2015-07-12: 5000 [IU] via INTRAVENOUS

## 2015-07-12 MED ORDER — FENTANYL CITRATE (PF) 100 MCG/2ML IJ SOLN
INTRAMUSCULAR | Status: DC | PRN
  Administered 2015-07-12 (×3): 50 ug via INTRAVENOUS

## 2015-07-12 SURGICAL SUPPLY — 25 items
BALLN LUTONIX 6X120X130 (BALLOONS) ×3
BALLN ULTRVRSE 2.5X300X150 (BALLOONS) ×3
BALLN ULTRVRSE 3X300X150 (BALLOONS) ×3
BALLN ULTRVRSE 3X300X150 OTW (BALLOONS) ×1
BALLN ULTRVRSE 7X40X130C (BALLOONS) ×3
BALLOON LUTONIX 6X120X130 (BALLOONS) IMPLANT
BALLOON ULTRVRSE 2.5X300X150 (BALLOONS) IMPLANT
BALLOON ULTRVRSE 3X300X150 OTW (BALLOONS) IMPLANT
BALLOON ULTRVRSE 7X40X130C (BALLOONS) IMPLANT
CANNULA 5F STIFF (CANNULA) ×2 IMPLANT
CATH CXI SUPP ANG 4FR 135 (MICROCATHETER) IMPLANT
CATH CXI SUPP ANG 4FR 135CM (MICROCATHETER) ×3
CATH PIG 70CM (CATHETERS) ×3 IMPLANT
CATH VERT 100CM (CATHETERS) ×2 IMPLANT
DEVICE PRESTO INFLATION (MISCELLANEOUS) ×2 IMPLANT
DEVICE STARCLOSE SE CLOSURE (Vascular Products) ×2 IMPLANT
GLIDEWIRE ADV .035X260CM (WIRE) ×2 IMPLANT
PACK ANGIOGRAPHY (CUSTOM PROCEDURE TRAY) ×3 IMPLANT
SHEATH ANL2 6FRX45 HC (SHEATH) ×2 IMPLANT
SHEATH BRITE TIP 5FRX11 (SHEATH) ×3 IMPLANT
SYR MEDRAD MARK V 150ML (SYRINGE) ×3 IMPLANT
TUBING CONTRAST HIGH PRESS 72 (TUBING) ×3 IMPLANT
WIRE G V18X300CM (WIRE) ×2 IMPLANT
WIRE HI TORQ STEELCORE18 300CM (WIRE) ×2 IMPLANT
WIRE J 3MM .035X145CM (WIRE) ×3 IMPLANT

## 2015-07-12 NOTE — H&P (Signed)
  [facility] History & Physical Update  The patient was interviewed and re-examined.  The patient's previous History and Physical has been reviewed and is unchanged.  There is no change in the plan of care. We plan to proceed with the scheduled procedure.  Thirza Pellicano, MD  07/12/2015, 9:54 AM

## 2015-07-12 NOTE — Op Note (Signed)
[facility] Percutaneous Study/Intervention Procedural Note   Date of Surgery: 07/12/2015  Surgeon(s):Noraa Pickeral   Assistants:none  Pre-operative Diagnosis: PAD with gangrene left great toe  Post-operative diagnosis: Same  Procedure(s) Performed: 1. Ultrasound guidance for vascular access right femoral artery 2. Catheter placement into left posterior tibial artery and peroneal arteries from right femoral approach 3. Aortogram and selective left lower extremity angiogram 4. Percutaneous transluminal angioplasty of left tibioperoneal trunk and peroneal artery 3 mm diameter by 30 cm length angioplasty balloon 5. Percutaneous transluminal angioplasty of the left posterior tibial artery with 2.5 mm diameter by 30 cm length angioplasty balloon  6.  Percutaneous transluminal angioplasty of the mid left superficial femoral artery with 6 mm diameter drug-coated balloon and 7 mm diameter conventional angioplasty balloon 7. StarClose closure device right femoral artery  EBL: 25 cc  Contrast: 90 cc  Fluoro Time: 12.9 minutes  Moderate Conscious Sedation Time: approximately 45 minutes using 4 mg of Versed and 150 mcg of Fentanyl  Indications: Patient is a 49 y.o.male with gangrene of his left great toe and poorly palpable pedal pulses. The patient is brought in for angiography for further evaluation and potential treatment. Risks and benefits are discussed and informed consent is obtained  Procedure: The patient was identified and appropriate procedural time out was performed. The patient was then placed supine on the table and prepped and draped in the usual sterile fashion.Moderate conscious sedation was administered during a face to face encounter with the patient throughout the procedure with my supervision of the RN administering medicines and monitoring the patient's vital  signs, pulse oximetry, telemetry and mental status throughout from the start of the procedure until the patient was taken to the recovery room. Ultrasound was used to evaluate the right common femoral artery. It was patent . A digital ultrasound image was acquired. A Seldinger needle was used to access the right common femoral artery under direct ultrasound guidance and a permanent image was performed. A 0.035 J wire was advanced without resistance and a 5Fr sheath was placed. Pigtail catheter was placed into the aorta and an AP aortogram was performed. This demonstrated normal renal arteries and normal aorta and iliac segments without significant stenosis. I then crossed the aortic bifurcation and advanced to the left femoral head. Selective left lower extremity angiogram was then performed. This demonstrated Moderate stenosis in the proximal to mid superficial femoral artery about 60-65%. Several other areas of less than 40% stenosis in the SFA and popliteal artery. The tibioperoneal trunk had a high-grade stenosis with multiple areas of greater than 70% stenosis in the peroneal artery which appeared to be the best runoff initially. The posterior tibial artery had a long high-grade stenosis of greater than 80% over the proximal 8-10 cm and then several areas of greater than 50% stenosis before terminating at the ankle. The anterior tibial artery was occluded proximally without good reconstitution distally seen. The patient was systemically heparinized and a 6 [place] Ansell sheath was then placed over the [employer] wire. I then used a Kumpe catheter and the advantage wire to navigate through the SFA stenoses and confirm intraluminal flow in the popliteal artery. I then first cross the tibioperoneal trunk and peroneal stenosis with the help of the Kumpe catheter and a V 18 wire and parked the wire at the ankle. A 3 mm diameter by 30 cm length angioplasty balloon was then inflated from the mid to distal  peroneal artery up through the tibioperoneal trunk to  encompass the entirety of the lesions. This was inflated to 10 atm for 1 minute. Completion angiogram showed about a 30% residual stenosis in the tibioperoneal trunk and proximal peroneal artery but brisk flow distally. I then turned my attention to the posterior tibial artery which was somewhat difficult to cannulate. I had to exchange for a CXI catheter and back to the advantage wire before switching back to the V 18 wire to get across the high-grade stenosis in the distal posterior tibial artery just above the ankle. Intraluminal flow was confirmed with the CXI catheter and the wire was parked in the foot. 2 inflations with a 2.5 mm diameter by 30 cm length angioplasty balloon was used to treat the entire posterior tibial artery from the foot up to the tibioperoneal trunk. The more distal inflation was to 8 atm and the more proximal inflation was to 14 atm. Completion angiogram showed about a 40% residual stenosis in the proximal posterior tibial artery but this was not flow limiting and there was now flow into the foot with no other areas of greater than 50% stenosis identified. I then turned my attention to the SFA lesion. I started with a 6 mm diameter by 12 cm length Lutonix drug-coated angioplasty balloon. This was inflated to 14 atm for 1 minute. At the most calcific area this was slightly undersized and I used a short 7 mm diameter by 4 cm length conventional angioplasty balloon inflated to 12 atm 1 minute. This resulted in nonflow limiting dissection and about a 20-25% residual stenosis which was not flow limiting and the flow was brisk distally. I elected to terminate the procedure. The sheath was removed and StarClose closure device was deployed in the right femoral artery with excellent hemostatic result. The patient was taken to the recovery room in stable condition having tolerated the procedure well.  Findings:  Aortogram: normal  renal arteries and normal aorta and iliac segments without significant stenosis Left Lower Extremity: Moderate stenosis in the proximal to mid superficial femoral artery about 60-65%. Several other areas of less than 40% stenosis in the SFA and popliteal artery. The tibioperoneal trunk had a high-grade stenosis with multiple areas of greater than 70% stenosis in the peroneal artery which appeared to be the best runoff initially. The posterior tibial artery had a long high-grade stenosis of greater than 80% over the proximal 8-10 cm and then several areas of greater than 50% stenosis before terminating at the ankle. The anterior tibial artery was occluded proximally without good reconstitution distally seen.   Disposition: Patient was taken to the recovery room in stable condition having tolerated the procedure well.  Complications: None  Devin Richmond 07/12/2015 11:57 AM

## 2015-07-12 NOTE — OR Nursing (Signed)
Report called to the care faculty  at [phone].  Informed on the procedure ant the after care needed and things to look for .

## 2015-07-12 NOTE — Discharge Instructions (Signed)

## 2015-07-13 ENCOUNTER — Encounter: Payer: Self-pay | Admitting: Vascular Surgery

## 2015-08-17 ENCOUNTER — Emergency Department: Payer: Medicare Other

## 2015-08-17 ENCOUNTER — Encounter: Payer: Self-pay | Admitting: Emergency Medicine

## 2015-08-17 ENCOUNTER — Inpatient Hospital Stay
Admission: EM | Admit: 2015-08-17 | Discharge: 2015-08-25 | DRG: 853 | Disposition: A | Discharge status: Skilled Nursing Facility | Payer: Medicare Other | Attending: Internal Medicine | Admitting: Internal Medicine

## 2015-08-17 DIAGNOSIS — R066 Hiccough: Secondary | ICD-10-CM

## 2015-08-17 DIAGNOSIS — Z794 Long term (current) use of insulin: Secondary | ICD-10-CM

## 2015-08-17 DIAGNOSIS — Z823 Family history of stroke: Secondary | ICD-10-CM | POA: Diagnosis not present

## 2015-08-17 DIAGNOSIS — Z7902 Long term (current) use of antithrombotics/antiplatelets: Secondary | ICD-10-CM

## 2015-08-17 DIAGNOSIS — E1122 Type 2 diabetes mellitus with diabetic chronic kidney disease: Secondary | ICD-10-CM | POA: Diagnosis present

## 2015-08-17 DIAGNOSIS — Z992 Dependence on renal dialysis: Secondary | ICD-10-CM

## 2015-08-17 DIAGNOSIS — E1152 Type 2 diabetes mellitus with diabetic peripheral angiopathy with gangrene: Secondary | ICD-10-CM

## 2015-08-17 DIAGNOSIS — E1169 Type 2 diabetes mellitus with other specified complication: Secondary | ICD-10-CM | POA: Diagnosis present

## 2015-08-17 DIAGNOSIS — F79 Unspecified intellectual disabilities: Secondary | ICD-10-CM | POA: Diagnosis present

## 2015-08-17 DIAGNOSIS — I998 Other disorder of circulatory system: Secondary | ICD-10-CM | POA: Diagnosis present

## 2015-08-17 DIAGNOSIS — A419 Sepsis, unspecified organism: Secondary | ICD-10-CM | POA: Diagnosis present

## 2015-08-17 DIAGNOSIS — N19 Unspecified kidney failure: Secondary | ICD-10-CM

## 2015-08-17 DIAGNOSIS — E875 Hyperkalemia: Secondary | ICD-10-CM | POA: Diagnosis present

## 2015-08-17 DIAGNOSIS — N186 End stage renal disease: Secondary | ICD-10-CM | POA: Diagnosis present

## 2015-08-17 DIAGNOSIS — Z8673 Personal history of transient ischemic attack (TIA), and cerebral infarction without residual deficits: Secondary | ICD-10-CM

## 2015-08-17 DIAGNOSIS — Z79899 Other long term (current) drug therapy: Secondary | ICD-10-CM

## 2015-08-17 DIAGNOSIS — Z882 Allergy status to sulfonamides status: Secondary | ICD-10-CM | POA: Diagnosis not present

## 2015-08-17 DIAGNOSIS — K219 Gastro-esophageal reflux disease without esophagitis: Secondary | ICD-10-CM | POA: Diagnosis present

## 2015-08-17 DIAGNOSIS — E1142 Type 2 diabetes mellitus with diabetic polyneuropathy: Secondary | ICD-10-CM | POA: Diagnosis present

## 2015-08-17 DIAGNOSIS — E11319 Type 2 diabetes mellitus with unspecified diabetic retinopathy without macular edema: Secondary | ICD-10-CM | POA: Diagnosis present

## 2015-08-17 DIAGNOSIS — D631 Anemia in chronic kidney disease: Secondary | ICD-10-CM | POA: Diagnosis present

## 2015-08-17 DIAGNOSIS — E785 Hyperlipidemia, unspecified: Secondary | ICD-10-CM | POA: Diagnosis present

## 2015-08-17 DIAGNOSIS — N2581 Secondary hyperparathyroidism of renal origin: Secondary | ICD-10-CM | POA: Diagnosis present

## 2015-08-17 DIAGNOSIS — M869 Osteomyelitis, unspecified: Secondary | ICD-10-CM | POA: Diagnosis present

## 2015-08-17 DIAGNOSIS — F329 Major depressive disorder, single episode, unspecified: Secondary | ICD-10-CM | POA: Diagnosis present

## 2015-08-17 DIAGNOSIS — I120 Hypertensive chronic kidney disease with stage 5 chronic kidney disease or end stage renal disease: Secondary | ICD-10-CM | POA: Diagnosis present

## 2015-08-17 DIAGNOSIS — E1165 Type 2 diabetes mellitus with hyperglycemia: Secondary | ICD-10-CM | POA: Diagnosis present

## 2015-08-17 HISTORY — DX: Dependence on renal dialysis: Z99.2

## 2015-08-17 LAB — BASIC METABOLIC PANEL
Anion gap: 17 — ABNORMAL HIGH (ref 5–15)
BUN: 89 mg/dL — AB (ref 6–20)
CALCIUM: 7.9 mg/dL — AB (ref 8.9–10.3)
CO2: 25 mmol/L (ref 22–32)
CREATININE: 18.25 mg/dL — AB (ref 0.61–1.24)
Chloride: 94 mmol/L — ABNORMAL LOW (ref 101–111)
GFR calc non Af Amer: 3 mL/min — ABNORMAL LOW (ref >60)
GFR, EST AFRICAN AMERICAN: 3 mL/min — AB (ref >60)
Glucose, Bld: 316 mg/dL — ABNORMAL HIGH (ref 65–99)
Potassium: 5.8 mmol/L — ABNORMAL HIGH (ref 3.5–5.1)
SODIUM: 136 mmol/L (ref 135–145)

## 2015-08-17 LAB — CBC
HCT: 37.1 % — ABNORMAL LOW (ref 40.0–52.0)
HEMOGLOBIN: 12.1 g/dL — AB (ref 13.0–18.0)
MCH: 31.5 pg (ref 26.0–34.0)
MCHC: 32.7 g/dL (ref 32.0–36.0)
MCV: 96.2 fL (ref 80.0–100.0)
PLATELETS: 227 10*3/uL (ref 150–440)
RBC: 3.85 MIL/uL — AB (ref 4.40–5.90)
RDW: 16.3 % — ABNORMAL HIGH (ref 11.5–14.5)
WBC: 21.6 10*3/uL — AB (ref 3.8–10.6)

## 2015-08-17 LAB — GLUCOSE, CAPILLARY
GLUCOSE-CAPILLARY: 335 mg/dL — AB (ref 65–99)
GLUCOSE-CAPILLARY: 383 mg/dL — AB (ref 65–99)

## 2015-08-17 LAB — BETA-HYDROXYBUTYRIC ACID: Beta-Hydroxybutyric Acid: 0.4 mmol/L — ABNORMAL HIGH (ref 0.05–0.27)

## 2015-08-17 MED ORDER — VANCOMYCIN HCL IN DEXTROSE 1-5 GM/200ML-% IV SOLN
1000.0000 mg | Freq: Once | INTRAVENOUS | Status: AC
  Administered 2015-08-17: 1000 mg via INTRAVENOUS
  Filled 2015-08-17: qty 200

## 2015-08-17 MED ORDER — ONDANSETRON HCL 4 MG/2ML IJ SOLN
INTRAMUSCULAR | Status: AC
  Administered 2015-08-17: 4 mg via INTRAVENOUS
  Filled 2015-08-17: qty 2

## 2015-08-17 MED ORDER — VANCOMYCIN HCL IN DEXTROSE 1-5 GM/200ML-% IV SOLN: 1000.0000 mg | INTRAVENOUS | Status: DC

## 2015-08-17 MED ORDER — PIPERACILLIN-TAZOBACTAM 3.375 G IVPB 30 MIN
3.3750 g | Freq: Once | INTRAVENOUS | Status: AC
  Administered 2015-08-17: 3.375 g via INTRAVENOUS
  Filled 2015-08-17: qty 50

## 2015-08-17 MED ORDER — PIPERACILLIN-TAZOBACTAM 3.375 G IVPB: 3.3750 g | Freq: Two times a day (BID) | INTRAVENOUS | Status: DC

## 2015-08-17 MED ORDER — SODIUM CHLORIDE 0.9 % IV SOLN
1.0000 g | Freq: Once | INTRAVENOUS | Status: AC
  Administered 2015-08-17: 1 g via INTRAVENOUS
  Filled 2015-08-17: qty 10

## 2015-08-17 MED ORDER — CLINDAMYCIN PHOSPHATE 600 MG/50ML IV SOLN
600.0000 mg | Freq: Once | INTRAVENOUS | Status: AC
  Administered 2015-08-17: 600 mg via INTRAVENOUS
  Filled 2015-08-17: qty 50

## 2015-08-17 MED ORDER — DEXTROSE 50 % IV SOLN
1.0000 | Freq: Once | INTRAVENOUS | Status: AC
  Administered 2015-08-17: 50 mL via INTRAVENOUS
  Filled 2015-08-17: qty 50

## 2015-08-17 MED ORDER — ONDANSETRON HCL 4 MG/2ML IJ SOLN
4.0000 mg | Freq: Once | INTRAMUSCULAR | Status: AC
  Administered 2015-08-17: 4 mg via INTRAVENOUS

## 2015-08-17 MED ORDER — INSULIN ASPART 100 UNIT/ML ~~LOC~~ SOLN
10.0000 [IU] | Freq: Once | SUBCUTANEOUS | Status: AC
  Administered 2015-08-17: 10 [IU] via INTRAVENOUS
  Filled 2015-08-17: qty 10

## 2015-08-17 MED ORDER — PIPERACILLIN-TAZOBACTAM 3.375 G IVPB: 3.3750 g | Freq: Once | INTRAVENOUS | Status: DC

## 2015-08-17 NOTE — ED Provider Notes (Signed)
[facility] Emergency Department Provider Note   ____________________________________________   First MD Initiated Contact with Patient 08/17/15 1755     (approximate)  I have reviewed the triage vital signs and the nursing notes.   HISTORY  Chief Complaint Open Wound; Weakness; and Altered Mental Status EM caveat: Patient with intellectual disability, cognitive impairment   HPI Devin Richmond is a 50 y.o. male previous history of stroke, diabetes, on dialysis, chronic kidney disease, and intellectual disability.  Patient presents today for evaluation of weakness. Care worker who is with him states that over the weekend he was fatigued, he's been getting worse over the last 5 days with increasing weakness. He also noticed that the left great toe has started to have an abnormal odor, and he had a recent surgery about a month ago to open it up.  Patient himself reports no pain. He will tell me that he is not having any pain.  He missed his dialysis today because he was too tired, and they were not able to get him up to the dialysis center as it did not have enough assistance to load him and bring him there.   Past Medical History:  Diagnosis Date  . Anemia   . CVA (cerebral infarction)   . Diabetes mellitus without complication ([name])   . Diabetic retinopathy ([name])   . Dialysis patient ([name])   . GERD (gastroesophageal reflux disease)   . Hyperlipidemia   . Hypertension   . Kidney disease   . 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 Pancreatitis   . Stroke [facility]([name])     Patient Active Problem List   Diagnosis Date Noted  . Sinus tachycardia ([name]) 08/19/2014    Past Surgical History:  Procedure Laterality Date  . fistula placement left upper arm    . PERIPHERAL VASCULAR CATHETERIZATION Right 07/20/2014   Procedure: A/V Shuntogram/Fistulagram;  Surgeon: Annice NeedyJason S Dew, MD;  Location: [facility] INVASIVE CV LAB;  Service: Cardiovascular;  Laterality:  Right;  . PERIPHERAL VASCULAR CATHETERIZATION N/A 07/20/2014   Procedure: A/V Shunt Intervention;  Surgeon: Annice NeedyJason S Dew, MD;  Location: [facility] INVASIVE CV LAB;  Service: Cardiovascular;  Laterality: N/A;  . PERIPHERAL VASCULAR CATHETERIZATION Left 07/12/2015   Procedure: Lower Extremity Angiography;  Surgeon: Annice NeedyJason S Dew, MD;  Location: [facility] INVASIVE CV LAB;  Service: Cardiovascular;  Laterality: Left;    Prior to Admission medications   Medication Sig Start Date End Date Taking? Authorizing Provider  atorvastatin (LIPITOR) 80 MG tablet Take 80 mg by mouth at bedtime.    Yes Historical Provider, MD  atropine 1 % ophthalmic solution Place 1 drop into the left eye 2 (two) times daily at 10 AM and 5 PM.    Yes Historical Provider, MD  carvedilol (COREG) 12.5 MG tablet Take 1 tablet (12.5 mg total) by mouth 2 (two) times daily. 08/19/14  Yes Vesta MixerPhilip J Nahser, MD  cinacalcet (SENSIPAR) 90 MG tablet Take 90 mg by mouth daily.   Yes Historical Provider, MD  clopidogrel (PLAVIX) 75 MG tablet Take 75 mg by mouth daily.   Yes Historical Provider, MD  folic acid (FOLVITE) 400 MCG tablet Take 400 mcg by mouth daily.    Yes Historical Provider, MD  insulin aspart (NOVOLOG) 100 UNIT/ML injection Inject 2-10 Units into the skin 3 (three) times daily before meals. Per sliding scale   Yes Historical Provider, MD  insulin detemir (LEVEMIR) 100 UNIT/ML injection Inject 10 Units into the skin at bedtime.  Yes Historical Provider, MD  lanthanum (FOSRENOL) 1000 MG chewable tablet Chew 1,000 mg by mouth 3 (three) times daily with meals. Only takes if has meal   Yes Historical Provider, MD  lidocaine-prilocaine (EMLA) cream Apply 1 application topically daily as needed (days for dialysis).    Yes Historical Provider, MD  loratadine (CLARITIN) 10 MG tablet Take 10 mg by mouth daily.   Yes Historical Provider, MD  meclizine (ANTIVERT) 25 MG tablet Take 25 mg by mouth 2 (two) times daily.   Yes Historical Provider, MD    mirtazapine (REMERON) 30 MG tablet Take 30 mg by mouth at bedtime.   Yes Historical Provider, MD  ranitidine (ZANTAC) 150 MG tablet Take 150 mg by mouth daily as needed for heartburn.   Yes Historical Provider, MD  cephALEXin (KEFLEX) 500 MG capsule Take 500 mg by mouth daily. Started 7/4 should end on 7/14    Historical Provider, MD  sevelamer carbonate (RENVELA) 800 MG tablet Take 800 mg by mouth 3 (three) times daily with meals.    Historical Provider, MD    Allergies Sulfa antibiotics  Family History  Problem Relation Age of Onset  . Stroke Father     Social History Social History  Substance Use Topics  . Smoking status: Never Smoker  . Smokeless tobacco: Never Used  . Alcohol use No    Review of Systems  EM caveat  ____________________________________________   PHYSICAL EXAM:  VITAL SIGNS: ED Triage Vitals  Enc Vitals Group     BP 08/17/15 1626 102/60     Pulse Rate 08/17/15 1626 81     Resp 08/17/15 1626 20     Temp 08/17/15 1626 97.7 F (36.5 C)     Temp Source 08/17/15 1626 Oral     SpO2 08/17/15 1626 98 %     Weight 08/17/15 1627 190 lb (86.2 kg)     Height 08/17/15 1627 6' (1.829 m)     Head Circumference --      Peak Flow --      Pain Score 08/17/15 1627 0     Pain Loc --      Pain Edu? --      Excl. in GC? --     Constitutional: Alert and orientedTo self, sitting up and in no distress. Accompanied by his group home worker who reports that his sister who is their primary point of contact knows and agreed to his evaluation at the ER Eyes: Conjunctivae are normal. PERRL. EOMI. Head: Atraumatic. Nose: No congestion/rhinnorhea. Mouth/Throat: Mucous membranes are slightly dry.  Oropharynx non-erythematous. Neck: No stridor.   Cardiovascular: Normal rate, regular rhythm. Grossly normal heart sounds.  Good peripheral circulation. Respiratory: Normal respiratory effort.  No retractions. Lungs CTAB. Gastrointestinal: Soft and nontender. No distention.   Musculoskeletal: No lower extremity tenderness nor edema on Right.  No joint effusions. Left lower extremity good range of motion, no focal abnormalities edema or erythema except the left great toe appears to have gangrenous appearance as well as minimal erythema and warmth at the base. Pulses dopplerable in DP/PT in the left lower. Neurologic:  Moves all extremities well. Follows commands, but limited responses to yes or no. Group home worker reports this is fairly normal, but he has been more fatigued and less talkative recently. Skin:  Skin is warm, dry and intact except as noted regarding the left lower leg/toe Psychiatric: Mood and affect are pleasant but flat.  ____________________________________________   LABS (all labs ordered are listed, but  only abnormal results are displayed)  Labs Reviewed  BASIC METABOLIC PANEL - Abnormal; Notable for the following:       Result Value   Potassium 5.8 (*)    Chloride 94 (*)    Glucose, Bld 316 (*)    BUN 89 (*)    Creatinine, Ser 18.25 (*)    Calcium 7.9 (*)    GFR calc non Af Amer 3 (*)    GFR calc Af Amer 3 (*)    Anion gap 17 (*)    All other components within normal limits  CBC - Abnormal; Notable for the following:    WBC 21.6 (*)    RBC 3.85 (*)    Hemoglobin 12.1 (*)    HCT 37.1 (*)    RDW 16.3 (*)    All other components within normal limits  GLUCOSE, CAPILLARY - Abnormal; Notable for the following:    Glucose-Capillary 335 (*)    All other components within normal limits  CULTURE, BLOOD (ROUTINE X 2)  CULTURE, BLOOD (ROUTINE X 2)  URINALYSIS COMPLETEWITH MICROSCOPIC ([facility] ONLY)  BETA-HYDROXYBUTYRIC ACID  CBG MONITORING, ED  CBG MONITORING, ED   ____________________________________________  EKG  Reviewed and interpreted by me at 1615 Ventricular rate 80 QRS 75 QTC 500 Normal sinus rhythm, slight repolarization abnormality noted in V2 versus minimal T-wave peaking. Compared with EKG from 08/19/2014 I suspect  this is chronic, likely early repolarization abnormality ____________________________________________  RADIOLOGY  Dg Chest Portable 1 View  Result Date: 08/17/2015 CLINICAL DATA:  Cough.  Weakness since Sunday. EXAM: PORTABLE CHEST 1 VIEW COMPARISON:  06/24/2015 FINDINGS: Midline trachea.  Normal heart size and mediastinal contours. Sharp costophrenic angles.  No pneumothorax.  Clear lungs. Patient rotated minimally right. IMPRESSION: No active disease. Electronically Signed   By: Jeronimo GreavesKyle  Talbot M.D.   On: 08/17/2015 19:15   Dg Foot Complete Left  Result Date: 08/17/2015 CLINICAL DATA:  Ischemic great toe. Left great toe wound for 2 months. EXAM: LEFT FOOT - COMPLETE 3+ VIEW COMPARISON:  None. FINDINGS: There is soft tissue ulceration involving the great toe at the level of the distal phalanx with heterogeneous lucency compatible with soft tissue emphysema. No definite underlying osseous erosion is identified to indicate osteomyelitis. No acute fracture or dislocation is identified. There are dorsiflexion deformities at the second through fifth MCP joints. Diffuse atherosclerotic vascular calcification is present. There is diffuse soft tissue swelling about the forefoot. IMPRESSION: Soft tissue ulceration and soft tissue emphysema involving the left great toe. No definite radiographic evidence of osteomyelitis. Electronically Signed   By: Sebastian AcheAllen  Grady M.D.   On: 08/17/2015 17:57    ____________________________________________   PROCEDURES  Procedure(s) performed: None  Procedures  Critical Care performed: Yes, see critical care note(s)  CRITICAL CARE Performed by: Sharyn CreamerQUALE, Nathania Waldman   Total critical care time: 40 minutes  Critical care time was exclusive of separately billable procedures and treating other patients.  Critical care was necessary to treat or prevent imminent or life-threatening deterioration.  Critical care was time spent personally by me on the following activities:  development of treatment plan with patient and/or surrogate as well as nursing, discussions with consultants, evaluation of patient's response to treatment, examination of patient, obtaining history from patient or surrogate, ordering and performing treatments and interventions, ordering and review of laboratory studies, ordering and review of radiographic studies, pulse oximetry and re-evaluation of patient's condition.  Patient treated for hyperkalemia with questionable peaking of the T waves and EKG abnormality. Requiring nephrology  consult and urgent hemodialysis. ____________________________________________   INITIAL IMPRESSION / ASSESSMENT AND PLAN / ED COURSE  Pertinent labs & imaging results that were available during my care of the patient were reviewed by me and considered in my medical decision making (see chart for details).  Patient transfer evaluation of fatigue, nausea, questionably loose bowel movements last couple of days that have not resolved, and concerning and malodorous increasingly swollen-appearing left great toe.  On clinical examination of patient appears to have evidence of wet gangrene, and by x-ray I question if there is emphysematous change however this appears primarily to involve the toe where there is been known previous debridement.  I discussed the case with vascular surgery, Dr. Evaristo Bury advises consult, broad-spectrum antibiotic, and that he and that I she will see the patient tomorrow.  I also discussed with Dr. Cherylann Ratel regarding EKG, potassium, metabolic panel. He will have the dialysis team performed hemodialysis for the patient tonight, and on review suspect the patient's elevated anion gap and low CO2 are most likely consistent with metabolic acidosis due to chronic kidney disease, and some uremia but not likely to be due to acute ketoacidosis.  Discussed with the hospitalist service, the patient will be admitted for multiple concerns. Including slightly  elevated potassium, which are treated with insulin and calcium at the recommendation of Dr. Cherylann Ratel, obtaining dialysis, and then consultation from vascular and podiatry.  ----------------------------------------- 7:43 PM on 08/17/2015 -----------------------------------------  No further nausea, the patient is resting comfortably. Stable hemodynamics. Awaiting hemodialysis.  Clinical Course     ____________________________________________   FINAL CLINICAL IMPRESSION(S) / ED DIAGNOSES  Final diagnoses:  Uremia  Diabetic wet gangrene of the foot ([name])      NEW MEDICATIONS STARTED DURING THIS VISIT:  New Prescriptions   No medications on file     Note:  This document was prepared using Dragon voice recognition software and may include unintentional dictation errors.     Sharyn Creamer, MD 08/17/15 [phone]

## 2015-08-17 NOTE — Progress Notes (Addendum)
ANTIBIOTIC CONSULT NOTE - INITIAL  Pharmacy Consult for Zosyn, Vancomycin  Indication: Osteomyelitis  Allergies  Allergen Reactions  . Sulfa Antibiotics Other (See Comments)    unknown    Patient Measurements: Height: 6' (182.9 cm) Weight: 189 lb 13.1 oz (86.1 kg) IBW/kg (Calculated) : 77.6 Adjusted Body Weight: 81.0 kg   Vital Signs: Temp: 97.7 F (36.5 C) (08/15 2050) Temp Source: Oral (08/15 2050) BP: 93/68 (08/15 2130) Pulse Rate: 91 (08/15 2130) Intake/Output from previous day: No intake/output data recorded. Intake/Output from this shift: No intake/output data recorded.  Labs:  Recent Labs  08/17/15 1804  WBC 21.6*  HGB 12.1*  PLT 227  CREATININE 18.25*   Estimated Creatinine Clearance: 5.2 mL/min (by C-G formula based on SCr of 18.25 mg/dL). No results for input(s): VANCOTROUGH, VANCOPEAK, VANCORANDOM, GENTTROUGH, GENTPEAK, GENTRANDOM, TOBRATROUGH, TOBRAPEAK, TOBRARND, AMIKACINPEAK, AMIKACINTROU, AMIKACIN in the last 72 hours.   Microbiology: No results found for this or any previous visit (from the past 720 hour(s)).  Medical History: Past Medical History:  Diagnosis Date  . Anemia   . CVA (cerebral infarction)   . Diabetes mellitus without complication ([name])   . Diabetic retinopathy ([name])   . Dialysis patient ([name])   . GERD (gastroesophageal reflux disease)   . Hyperlipidemia   . Hypertension   . Kidney disease   . 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 Pancreatitis   . Stroke [facility]([name])     Medications:  Scheduled:   Assessment: Pharmacy consulted to dose Vanc and Zosyn in this 50 year old male admitted with sepsis, gangrene toe.  Pt is on HD every T-Th-Sat but missed HD earlier on 8/15.  HD was started late on 8/15 around 21:00.    Goal of Therapy:  Vancomycin trough = 10 - 20 mcg/mL   Plan:  Expected duration 7 days with resolution of temperature and/or normalization of WBC   Vancomycin 1 gm IV X 1 given in ED on 8/15 @ 2000.  Calculated LD  would have been 2 gm but pt began emergency HD before additional vanc could be ordered.  Vancomycin 1 gm IV Q T-Th-Sat ordered to start on 8/15 following HD session. Will draw 1st Vanc trough on 8/19 30 minutes before 3rd HD session.   Zosyn 3.375 gm IV X 1 given on 8/15 @ 20:00. Zosyn 3.375 gm IV Q12H EI ordered to start 8/16 @ 8:00.   Salahuddin Arismendez D 08/17/2015,9:49 PM

## 2015-08-17 NOTE — Progress Notes (Signed)
[facility]  ROUNDING NOTE   Subjective:  Patient well known to us. He missed his regularly scheduled dialysis today. He was subsequently brought to the emergency department for evaluation.  Now has a necrotic left first toe. He has been following with vascular surgery. Mild hyperkalemia has been noted as well.   Objective:  Vital signs in last 24 hours:  Temp:  [97.7 F (36.5 C)] 97.7 F (36.5 C) (08/15 1626) Pulse Rate:  [81-85] 81 (08/15 1930) Resp:  [16-20] 16 (08/15 1930) BP: (102-138)/(60-80) 108/66 (08/15 1930) SpO2:  [96 %-98 %] 97 % (08/15 1930) Weight:  [86.2 kg (190 lb)] 86.2 kg (190 lb) (08/15 1627)  Weight change:  Filed Weights   08/17/15 1627  Weight: 86.2 kg (190 lb)    Intake/Output: No intake/output data recorded.   Intake/Output this shift:  No intake/output data recorded.  Physical Exam: General: No acute distress  Head: Normocephalic, atraumatic. Moist oral mucosal membranes  Eyes: Anicteric  Neck: Supple, trachea midline  Lungs:  Clear to auscultation, normal effort  Heart: S1S2 no rubs  Abdomen:  Soft, nontender,   Extremities: trace peripheral edema, necrotic left first toe   Neurologic: Nonfocal, moving all four extremities  Skin: No lesions  Access: Left upper extremity AV fistula     Basic Metabolic Panel:  Recent Labs Lab 08/17/15 1804  NA 136  K 5.8*  CL 94*  CO2 25  GLUCOSE 316*  BUN 89*  CREATININE 18.25*  CALCIUM 7.9*    Liver Function Tests: No results for input(s): AST, ALT, ALKPHOS, BILITOT, PROT, ALBUMIN in the last 168 hours. No results for input(s): LIPASE, AMYLASE in the last 168 hours. No results for input(s): AMMONIA in the last 168 hours.  CBC:  Recent Labs Lab 08/17/15 1804  WBC 21.6*  HGB 12.1*  HCT 37.1*  MCV 96.2  PLT 227    Cardiac Enzymes: No results for input(s): CKTOTAL, CKMB, CKMBINDEX, TROPONINI in the last 168 hours.  BNP: Invalid input(s): POCBNP  CBG:  Recent  Labs Lab 08/17/15 1634  GLUCAP 335*    Microbiology: Results for orders placed or performed during the hospital encounter of 06/24/15  Blood Culture (routine x 2)     Status: None   Collection Time: 06/24/15  4:48 PM  Result Value Ref Range Status   Specimen Description BLOOD LEFT HAND  Final   Special Requests BOTTLES DRAWN AEROBIC AND ANAEROBIC  5CC  Final   Culture NO GROWTH 5 DAYS  Final   Report Status 06/29/2015 FINAL  Final  Blood Culture (routine x 2)     Status: Abnormal   Collection Time: 06/24/15  4:49 PM  Result Value Ref Range Status   Specimen Description   Final    BLOOD LEFT ANTECUBITAL Performed at [facility]    Special Requests BOTTLES DRAWN AEROBIC AND ANAEROBIC  15CC  Final   Culture  Setup Time   Final    GRAM POSITIVE COCCI IN BOTH AEROBIC AND ANAEROBIC BOTTLES CRITICAL RESULT CALLED TO, READ BACK BY AND VERIFIED WITH: CALLED CHRISTINA KAPSOUDAS@1044  ON 06/25/15 BY HKP    Culture STREPTOCOCCUS GROUP G (A)  Final   Report Status 06/27/2015 FINAL  Final   Organism ID, Bacteria STREPTOCOCCUS GROUP G  Final      Susceptibility   Streptococcus group g - MIC*    CLINDAMYCIN <=0.25 RESISTANT Resistant     AMPICILLIN <=0.25 SENSITIVE Sensitive     ERYTHROMYCIN 2 RESISTANT Resistant  VANCOMYCIN 0.5 SENSITIVE Sensitive     CEFTRIAXONE <=0.12 SENSITIVE Sensitive     LEVOFLOXACIN 0.5 SENSITIVE Sensitive     * STREPTOCOCCUS GROUP G  Blood Culture ID Panel (Reflexed)     Status: Abnormal   Collection Time: 06/24/15  4:49 PM  Result Value Ref Range Status   Enterococcus species NOT DETECTED NOT DETECTED Final   Vancomycin resistance NOT DETECTED NOT DETECTED Final   Listeria monocytogenes NOT DETECTED NOT DETECTED Final   Staphylococcus species NOT DETECTED NOT DETECTED Final   Staphylococcus aureus NOT DETECTED NOT DETECTED Final   Methicillin resistance NOT DETECTED NOT DETECTED Final   Streptococcus species DETECTED (A) NOT DETECTED Final     Comment: CRITICAL RESULT CALLED TO, READ BACK BY AND VERIFIED WITH: CALLED TO CHRISTINA KAPSOUDAS@1044  ON 06/25/15 BY HKP    Streptococcus agalactiae NOT DETECTED NOT DETECTED Final   Streptococcus pneumoniae NOT DETECTED NOT DETECTED Final   Streptococcus pyogenes NOT DETECTED NOT DETECTED Final   Acinetobacter baumannii NOT DETECTED NOT DETECTED Final   Enterobacteriaceae species NOT DETECTED NOT DETECTED Final   Enterobacter cloacae complex NOT DETECTED NOT DETECTED Final   Escherichia coli NOT DETECTED NOT DETECTED Final   Klebsiella oxytoca NOT DETECTED NOT DETECTED Final   Klebsiella pneumoniae NOT DETECTED NOT DETECTED Final   Proteus species NOT DETECTED NOT DETECTED Final   Serratia marcescens NOT DETECTED NOT DETECTED Final   Carbapenem resistance NOT DETECTED NOT DETECTED Final   Haemophilus influenzae NOT DETECTED NOT DETECTED Final   Neisseria meningitidis NOT DETECTED NOT DETECTED Final   Pseudomonas aeruginosa NOT DETECTED NOT DETECTED Final   Candida albicans NOT DETECTED NOT DETECTED Final   Candida glabrata NOT DETECTED NOT DETECTED Final   Candida krusei NOT DETECTED NOT DETECTED Final   Candida parapsilosis NOT DETECTED NOT DETECTED Final   Candida tropicalis NOT DETECTED NOT DETECTED Final    Coagulation Studies: No results for input(s): LABPROT, INR in the last 72 hours.  Urinalysis: No results for input(s): COLORURINE, LABSPEC, PHURINE, GLUCOSEU, HGBUR, BILIRUBINUR, KETONESUR, PROTEINUR, UROBILINOGEN, NITRITE, LEUKOCYTESUR in the last 72 hours.  Invalid input(s): APPERANCEUR    Imaging: Dg Chest Portable 1 View  Result Date: 08/17/2015 CLINICAL DATA:  Cough.  Weakness since Sunday. EXAM: PORTABLE CHEST 1 VIEW COMPARISON:  06/24/2015 FINDINGS: Midline trachea.  Normal heart size and mediastinal contours. Sharp costophrenic angles.  No pneumothorax.  Clear lungs. Patient rotated minimally right. IMPRESSION: No active disease. Electronically Signed   By: Jeronimo GreavesKyle   Talbot M.D.   On: 08/17/2015 19:15   Dg Foot Complete Left  Result Date: 08/17/2015 CLINICAL DATA:  Ischemic great toe. Left great toe wound for 2 months. EXAM: LEFT FOOT - COMPLETE 3+ VIEW COMPARISON:  None. FINDINGS: There is soft tissue ulceration involving the great toe at the level of the distal phalanx with heterogeneous lucency compatible with soft tissue emphysema. No definite underlying osseous erosion is identified to indicate osteomyelitis. No acute fracture or dislocation is identified. There are dorsiflexion deformities at the second through fifth MCP joints. Diffuse atherosclerotic vascular calcification is present. There is diffuse soft tissue swelling about the forefoot. IMPRESSION: Soft tissue ulceration and soft tissue emphysema involving the left great toe. No definite radiographic evidence of osteomyelitis. Electronically Signed   By: Sebastian AcheAllen  Grady M.D.   On: 08/17/2015 17:57     Medications:   . calcium gluconate 1 GM IV 1 g (08/17/15 1940)  . piperacillin-tazobactam 3.375 g (08/17/15 1942)  . vancomycin  Assessment/ Plan:  50 y.o. male with past medical history of end-stage renal disease on hemodialysis TTS, diabetes will start 2, hypertension, history of CVA, anemia of chronic kidney disease, secondary hyperparathyroidism, resides at assisted living facility, peripheral vascular disease, necrotic left first toe  [facility]/TTS  1. End-stage renal disease TTS: Patient missed hemodialysis today as well. Feeling well likely secondary to necrotic left first toe. We will proceed with hemodialysis tonight given her hyperkalemia and missed dialysis today.  2. Hyperkalemia. We will use a 2 potassium bath to lower serum potassium. Follow-up serum potassium tomorrow.  3. Secondary hyperparathyroidism.  Check intact PTH and phosphorus with dialysis today.  4. Anemia of chronic kidney disease. Hemoglobin currently 12.1.  Hold Epogen for now.  5.  Gangrene of  left first toe/peripheral vascular disease. Recommend consulting with vascular surgery as well as podiatry. Agree with broad-spectrum metabolic with the use of Zosyn and vancomycin.   LOS: 0 Tyyonna Soucy 8/15/20177:54 PM

## 2015-08-17 NOTE — ED Triage Notes (Signed)
Patient presents to the ED with weakness since Sunday.  Patient also has wound to his left great toe that he has had for approx. 2 months but staff at patient's group home states is getting worse.  Staff states normally patient is more alert, talkative, and more mobile than he is today.  Patient appears very tired, having difficulty answering questions.  Patient denies pain.  Patient is hypotensive compared to his normal, per staff.

## 2015-08-17 NOTE — ED Notes (Addendum)
Pt stays at [facility], contact # [phone].

## 2015-08-17 NOTE — Progress Notes (Signed)
Pre HD  

## 2015-08-17 NOTE — ED Notes (Signed)
Dialysis called to make aware of ready bed, dialysis RN will call floor and transport after dialyzed

## 2015-08-17 NOTE — Progress Notes (Signed)
PRE HD tx 

## 2015-08-17 NOTE — Progress Notes (Signed)
HD TX started  

## 2015-08-17 NOTE — H&P (Signed)
[facility] [place] [facility]   PATIENT NAME: Devin Richmond    MR#:  [id number]  DATE OF BIRTH:  [birth year]  DATE OF ADMISSION:  08/17/2015  PRIMARY CARE PHYSICIAN: Leotis Shames, MD   REQUESTING/REFERRING PHYSICIAN: Dr. Sharyn Creamer  CHIEF COMPLAINT:   Chief Complaint  Patient presents with  . Open Wound  . Weakness  . Altered Mental Status    HISTORY OF PRESENT ILLNESS:  Devin Richmond  is a 50 y.o. male with a known history of End-stage renal disease on T-Th-S dialysis, neuropathy, stroke, peripheral vascular disease, diabetic retinopathy, hypertension, diabetes mellitus presents from group home secondary to worsening left great toe gangrene. Patient is not a great historian, his caregiver has left. He missed his dialysis today because he was too weak and couldn't get up because his stool was worsening. He says the gangrenous changes have been there almost a month ago. It started to be more swollen and has a malodorous discharge at this time. No fevers. Potassium elevated.  PAST MEDICAL HISTORY:   Past Medical History:  Diagnosis Date  . Anemia   . CVA (cerebral infarction)   . Diabetes mellitus without complication ([name])   . Diabetic retinopathy ([name])   . Dialysis patient ([name])   . GERD (gastroesophageal reflux disease)   . Hyperlipidemia   . Hypertension   . Kidney disease   . 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 Pancreatitis   . Stroke [facility])     PAST SURGICAL HISTORY:   Past Surgical History:  Procedure Laterality Date  . fistula placement left upper arm    . PERIPHERAL VASCULAR CATHETERIZATION Right 07/20/2014   Procedure: A/V Shuntogram/Fistulagram;  Surgeon: Annice Needy, MD;  Location: [facility] INVASIVE CV LAB;  Service: Cardiovascular;  Laterality: Right;  . PERIPHERAL VASCULAR CATHETERIZATION N/A 07/20/2014   Procedure: A/V Shunt Intervention;  Surgeon: Annice Needy, MD;  Location: [facility] INVASIVE CV LAB;  Service: Cardiovascular;  Laterality: N/A;    . PERIPHERAL VASCULAR CATHETERIZATION Left 07/12/2015   Procedure: Lower Extremity Angiography;  Surgeon: Annice Needy, MD;  Location: [facility] INVASIVE CV LAB;  Service: Cardiovascular;  Laterality: Left;    SOCIAL HISTORY:   Social History  Substance Use Topics  . Smoking status: Never Smoker  . Smokeless tobacco: Never Used  . Alcohol use No    FAMILY HISTORY:   Family History  Problem Relation Age of Onset  . Stroke Father     DRUG ALLERGIES:   Allergies  Allergen Reactions  . Sulfa Antibiotics Other (See Comments)    unknown    REVIEW OF SYSTEMS:   Review of Systems  Constitutional: Positive for fever and malaise/fatigue. Negative for chills and weight loss.  HENT: Negative for ear discharge, ear pain, hearing loss and nosebleeds.   Eyes: Positive for blurred vision. Negative for double vision and photophobia.       Left eye blind  Respiratory: Negative for cough, hemoptysis, shortness of breath and wheezing.   Cardiovascular: Negative for chest pain, palpitations, orthopnea and leg swelling.  Gastrointestinal: Negative for abdominal pain, constipation, diarrhea, heartburn, melena, nausea and vomiting.  Genitourinary: Negative for dysuria.       Oliguric  Musculoskeletal: Positive for joint pain. Negative for back pain, myalgias and neck pain.  Skin: Negative for rash.  Neurological: Negative for dizziness, tingling, sensory change, speech change, focal weakness and headaches.  Endo/Heme/Allergies: Does not bruise/bleed easily.  Psychiatric/Behavioral: Negative for depression.    MEDICATIONS AT  HOME:   Prior to Admission medications   Medication Sig Start Date End Date Taking? Authorizing Provider  atorvastatin (LIPITOR) 80 MG tablet Take 80 mg by mouth at bedtime.    Yes Historical Provider, MD  atropine 1 % ophthalmic solution Place 1 drop into the left eye 2 (two) times daily at 10 AM and 5 PM.    Yes Historical Provider, MD  carvedilol (COREG) 12.5 MG tablet  Take 1 tablet (12.5 mg total) by mouth 2 (two) times daily. 08/19/14  Yes Vesta MixerPhilip J Nahser, MD  cinacalcet (SENSIPAR) 90 MG tablet Take 90 mg by mouth daily.   Yes Historical Provider, MD  clopidogrel (PLAVIX) 75 MG tablet Take 75 mg by mouth daily.   Yes Historical Provider, MD  folic acid (FOLVITE) 400 MCG tablet Take 400 mcg by mouth daily.    Yes Historical Provider, MD  insulin aspart (NOVOLOG) 100 UNIT/ML injection Inject 2-10 Units into the skin 3 (three) times daily before meals. Per sliding scale   Yes Historical Provider, MD  insulin detemir (LEVEMIR) 100 UNIT/ML injection Inject 10 Units into the skin at bedtime.    Yes Historical Provider, MD  lanthanum (FOSRENOL) 1000 MG chewable tablet Chew 1,000 mg by mouth 3 (three) times daily with meals. Only takes if has meal   Yes Historical Provider, MD  lidocaine-prilocaine (EMLA) cream Apply 1 application topically daily as needed (days for dialysis).    Yes Historical Provider, MD  loratadine (CLARITIN) 10 MG tablet Take 10 mg by mouth daily.   Yes Historical Provider, MD  meclizine (ANTIVERT) 25 MG tablet Take 25 mg by mouth 2 (two) times daily.   Yes Historical Provider, MD  mirtazapine (REMERON) 30 MG tablet Take 30 mg by mouth at bedtime.   Yes Historical Provider, MD  ranitidine (ZANTAC) 150 MG tablet Take 150 mg by mouth daily as needed for heartburn.   Yes Historical Provider, MD  cephALEXin (KEFLEX) 500 MG capsule Take 500 mg by mouth daily. Started 7/4 should end on 7/14    Historical Provider, MD  sevelamer carbonate (RENVELA) 800 MG tablet Take 800 mg by mouth 3 (three) times daily with meals.    Historical Provider, MD      VITAL SIGNS:  Blood pressure 108/66, pulse 81, temperature 97.7 F (36.5 C), temperature source Oral, resp. rate 16, height 6' (1.829 m), weight 86.2 kg (190 lb), SpO2 97 %.  PHYSICAL EXAMINATION:   Physical Exam  GENERAL:  50 y.o.-year-old patient lying in the bed with no acute distress.  EYES: Pupils  equal, round, reactive to light and accommodation. No scleral icterus. Extraocular muscles intact.  HEENT: Head atraumatic, normocephalic. Oropharynx and nasopharynx clear.  NECK:  Supple, no jugular venous distention. No thyroid enlargement, no tenderness.  LUNGS: Normal breath sounds bilaterally, no wheezing, rales,rhonchi or crepitation. No use of accessory muscles of respiration. Decreased bibasilar breath sounds CARDIOVASCULAR: S1, S2 normal. No murmurs, rubs, or gallops.  ABDOMEN: Soft, nontender, nondistended. Bowel sounds present. No organomegaly or mass.  EXTREMITIES: No pedal edema, cyanosis, or clubbing. Left foot big toe tip gangrenous, swollen upto the metatarsal area, purulent discharge Right forearm fistula NEUROLOGIC: Cranial nerves II through XII are intact. Following commands, moving all extremities.Sensation intact. Gait not checked.  PSYCHIATRIC: The patient is alert and oriented to himself. Slow to respond.Marland Kitchen.  SKIN: No obvious rash, lesion, or ulcer.   LABORATORY PANEL:   CBC  Recent Labs Lab 08/17/15 1804  WBC 21.6*  HGB 12.1*  HCT  37.1*  PLT 227   ------------------------------------------------------------------------------------------------------------------  Chemistries   Recent Labs Lab 08/17/15 1804  NA 136  K 5.8*  CL 94*  CO2 25  GLUCOSE 316*  BUN 89*  CREATININE 18.25*  CALCIUM 7.9*   ------------------------------------------------------------------------------------------------------------------  Cardiac Enzymes No results for input(s): TROPONINI in the last 168 hours. ------------------------------------------------------------------------------------------------------------------  RADIOLOGY:  Dg Chest Portable 1 View  Result Date: 08/17/2015 CLINICAL DATA:  Cough.  Weakness since Sunday. EXAM: PORTABLE CHEST 1 VIEW COMPARISON:  06/24/2015 FINDINGS: Midline trachea.  Normal heart size and mediastinal contours. Sharp costophrenic  angles.  No pneumothorax.  Clear lungs. Patient rotated minimally right. IMPRESSION: No active disease. Electronically Signed   By: Jeronimo GreavesKyle  Talbot M.D.   On: 08/17/2015 19:15   Dg Foot Complete Left  Result Date: 08/17/2015 CLINICAL DATA:  Ischemic great toe. Left great toe wound for 2 months. EXAM: LEFT FOOT - COMPLETE 3+ VIEW COMPARISON:  None. FINDINGS: There is soft tissue ulceration involving the great toe at the level of the distal phalanx with heterogeneous lucency compatible with soft tissue emphysema. No definite underlying osseous erosion is identified to indicate osteomyelitis. No acute fracture or dislocation is identified. There are dorsiflexion deformities at the second through fifth MCP joints. Diffuse atherosclerotic vascular calcification is present. There is diffuse soft tissue swelling about the forefoot. IMPRESSION: Soft tissue ulceration and soft tissue emphysema involving the left great toe. No definite radiographic evidence of osteomyelitis. Electronically Signed   By: Sebastian AcheAllen  Grady M.D.   On: 08/17/2015 17:57    EKG:   Orders placed or performed during the hospital encounter of 08/17/15  . ED EKG  . ED EKG    IMPRESSION AND PLAN:   Weston Brassric Muckleroy  is a 50 y.o. male with a known history of End-stage renal disease on T-Th-S dialysis, neuropathy, stroke, peripheral vascular disease, diabetic retinopathy, hypertension, diabetes mellitus presents from group home secondary to worsening left great toe gangrene.    #1 sepsis-secondary to left great toe gangrene-x-ray with no evidence of osteomyelitis. However significant soft tissue ulceration noted. -Podiatry and vascular consults. -Infectious disease consult. Blood cultures pending. -Started on broad-spectrum antibiotics with vancomycin and Zosyn.  #2 hyperkalemia-from missing dialysis. Nephrology consulted and dialysis tonight. -Received treatment for hyperkalemia in the emergency room.  #3 end-stage renal disease on  hemodialysis-for dialysis today. Nephrology consulted  #4 diabetes mellitus-continue Levemir and pre-meal insulin  #5 DVT prophylaxis-subcutaneous heparin   All the records are reviewed and case discussed with ED provider. Management plans discussed with the patient, family and they are in agreement.  CODE STATUS:  Full Code  TOTAL TIME TAKING CARE OF THIS PATIENT: 50 minutes.    Enid BaasKALISETTI,Conchetta Lamia M.D on 08/17/2015 at 9:18 PM  Between 7am to 6pm - Pager - [phone]  After 6pm go to [email or web address] - Social research officer, governmentpassword EPAS [facility]  [facility]  Office  [phone]  CC: Primary care physician; Leotis ShamesSingh,Jasmine, MD

## 2015-08-18 LAB — GLUCOSE, CAPILLARY
GLUCOSE-CAPILLARY: 210 mg/dL — AB (ref 65–99)
GLUCOSE-CAPILLARY: 219 mg/dL — AB (ref 65–99)
GLUCOSE-CAPILLARY: 220 mg/dL — AB (ref 65–99)
Glucose-Capillary: 246 mg/dL — ABNORMAL HIGH (ref 65–99)
Glucose-Capillary: 175 mg/dL — ABNORMAL HIGH (ref 65–99)

## 2015-08-18 LAB — CBC
HEMATOCRIT: 35.6 % — AB (ref 40.0–52.0)
Hemoglobin: 11.7 g/dL — ABNORMAL LOW (ref 13.0–18.0)
MCH: 31.2 pg (ref 26.0–34.0)
MCHC: 32.8 g/dL (ref 32.0–36.0)
MCV: 95.3 fL (ref 80.0–100.0)
PLATELETS: 239 10*3/uL (ref 150–440)
RBC: 3.73 MIL/uL — ABNORMAL LOW (ref 4.40–5.90)
RDW: 17 % — AB (ref 11.5–14.5)
WBC: 22.6 10*3/uL — AB (ref 3.8–10.6)

## 2015-08-18 LAB — BASIC METABOLIC PANEL
Anion gap: 14 (ref 5–15)
BUN: 71 mg/dL — AB (ref 6–20)
CALCIUM: 8 mg/dL — AB (ref 8.9–10.3)
CO2: 31 mmol/L (ref 22–32)
CREATININE: 13.51 mg/dL — AB (ref 0.61–1.24)
Chloride: 94 mmol/L — ABNORMAL LOW (ref 101–111)
GFR calc Af Amer: 4 mL/min — ABNORMAL LOW (ref >60)
GFR, EST NON AFRICAN AMERICAN: 4 mL/min — AB (ref >60)
Glucose, Bld: 256 mg/dL — ABNORMAL HIGH (ref 65–99)
POTASSIUM: 5 mmol/L (ref 3.5–5.1)
SODIUM: 139 mmol/L (ref 135–145)

## 2015-08-18 LAB — MRSA PCR SCREENING: MRSA BY PCR: NEGATIVE

## 2015-08-18 LAB — HEMOGLOBIN A1C: Hgb A1c MFr Bld: 9.5 % — ABNORMAL HIGH (ref 4.0–6.0)

## 2015-08-18 MED ORDER — INSULIN ASPART 100 UNIT/ML ~~LOC~~ SOLN: 2.0000 [IU] | Freq: Three times a day (TID) | SUBCUTANEOUS | Status: DC

## 2015-08-18 MED ORDER — INSULIN ASPART 100 UNIT/ML ~~LOC~~ SOLN
3.0000 [IU] | Freq: Three times a day (TID) | SUBCUTANEOUS | Status: DC
Start: 2015-08-18 — End: 2015-08-20
  Administered 2015-08-18 – 2015-08-20 (×4): 3 [IU] via SUBCUTANEOUS
  Filled 2015-08-18 (×5): qty 3

## 2015-08-18 MED ORDER — PIPERACILLIN-TAZOBACTAM 3.375 G IVPB
3.3750 g | Freq: Two times a day (BID) | INTRAVENOUS | Status: DC
  Administered 2015-08-18: 3.375 g via INTRAVENOUS
  Filled 2015-08-18 (×2): qty 50

## 2015-08-18 MED ORDER — VANCOMYCIN HCL 500 MG IV SOLR
500.0000 mg | Freq: Once | INTRAVENOUS | Status: DC
  Filled 2015-08-18: qty 500

## 2015-08-18 MED ORDER — SEVELAMER CARBONATE 800 MG PO TABS
800.0000 mg | ORAL_TABLET | Freq: Three times a day (TID) | ORAL | Status: DC
  Filled 2015-08-18: qty 1

## 2015-08-18 MED ORDER — MIRTAZAPINE 15 MG PO TABS
30.0000 mg | ORAL_TABLET | Freq: Every day | ORAL | Status: DC
  Administered 2015-08-18 – 2015-08-24 (×6): 30 mg via ORAL
  Filled 2015-08-18 (×8): qty 2

## 2015-08-18 MED ORDER — ACETAMINOPHEN 650 MG RE SUPP: 650.0000 mg | Freq: Four times a day (QID) | RECTAL | Status: DC | PRN

## 2015-08-18 MED ORDER — VANCOMYCIN HCL 10 G IV SOLR
1500.0000 mg | INTRAVENOUS | Status: DC
  Administered 2015-08-18: 1500 mg via INTRAVENOUS
  Filled 2015-08-18: qty 1500

## 2015-08-18 MED ORDER — ATROPINE SULFATE 1 % OP SOLN
1.0000 [drp] | Freq: Two times a day (BID) | OPHTHALMIC | Status: DC
  Administered 2015-08-18 – 2015-08-24 (×9): 1 [drp] via OPHTHALMIC
  Filled 2015-08-18: qty 2

## 2015-08-18 MED ORDER — LORATADINE 10 MG PO TABS
10.0000 mg | ORAL_TABLET | Freq: Every day | ORAL | Status: DC
  Administered 2015-08-18 – 2015-08-25 (×6): 10 mg via ORAL
  Filled 2015-08-18 (×6): qty 1

## 2015-08-18 MED ORDER — CARVEDILOL 12.5 MG PO TABS
12.5000 mg | ORAL_TABLET | Freq: Two times a day (BID) | ORAL | Status: DC
  Administered 2015-08-18 – 2015-08-25 (×7): 12.5 mg via ORAL
  Filled 2015-08-18 (×9): qty 1

## 2015-08-18 MED ORDER — FAMOTIDINE 20 MG PO TABS
20.0000 mg | ORAL_TABLET | Freq: Every day | ORAL | Status: DC
Start: 2015-08-18 — End: 2015-08-25
  Administered 2015-08-18 – 2015-08-25 (×6): 20 mg via ORAL
  Filled 2015-08-18 (×6): qty 1

## 2015-08-18 MED ORDER — FOLIC ACID 1 MG PO TABS
500.0000 ug | ORAL_TABLET | Freq: Every day | ORAL | Status: DC
  Administered 2015-08-18 – 2015-08-25 (×6): 0.5 mg via ORAL
  Filled 2015-08-18 (×6): qty 1

## 2015-08-18 MED ORDER — INSULIN DETEMIR 100 UNIT/ML ~~LOC~~ SOLN
10.0000 [IU] | Freq: Every day | SUBCUTANEOUS | Status: DC
  Administered 2015-08-18: 10 [IU] via SUBCUTANEOUS
  Filled 2015-08-18 (×2): qty 0.1

## 2015-08-18 MED ORDER — LIDOCAINE-PRILOCAINE 2.5-2.5 % EX CREA: 1.0000 "application " | TOPICAL_CREAM | Freq: Every day | CUTANEOUS | Status: DC | PRN

## 2015-08-18 MED ORDER — MECLIZINE HCL 25 MG PO TABS
25.0000 mg | ORAL_TABLET | Freq: Two times a day (BID) | ORAL | Status: DC
  Administered 2015-08-18 – 2015-08-24 (×10): 25 mg via ORAL
  Filled 2015-08-18 (×13): qty 1

## 2015-08-18 MED ORDER — HEPARIN SODIUM (PORCINE) 5000 UNIT/ML IJ SOLN
5000.0000 [IU] | Freq: Three times a day (TID) | INTRAMUSCULAR | Status: DC
  Administered 2015-08-18 – 2015-08-25 (×16): 5000 [IU] via SUBCUTANEOUS
  Filled 2015-08-18 (×18): qty 1

## 2015-08-18 MED ORDER — CHLORHEXIDINE GLUCONATE CLOTH 2 % EX PADS
6.0000 | MEDICATED_PAD | Freq: Once | CUTANEOUS | Status: AC
  Administered 2015-08-18: 6 via TOPICAL

## 2015-08-18 MED ORDER — INSULIN ASPART 100 UNIT/ML ~~LOC~~ SOLN: 0.0000 [IU] | Freq: Every day | SUBCUTANEOUS | Status: DC

## 2015-08-18 MED ORDER — SODIUM CHLORIDE 0.9 % IV SOLN
3.0000 g | INTRAVENOUS | Status: DC
  Administered 2015-08-18 – 2015-08-24 (×7): 3 g via INTRAVENOUS
  Filled 2015-08-18 (×8): qty 3

## 2015-08-18 MED ORDER — ONDANSETRON HCL 4 MG PO TABS: 4.0000 mg | ORAL_TABLET | Freq: Four times a day (QID) | ORAL | Status: DC | PRN

## 2015-08-18 MED ORDER — INSULIN DETEMIR 100 UNIT/ML ~~LOC~~ SOLN
15.0000 [IU] | Freq: Every day | SUBCUTANEOUS | Status: DC
  Administered 2015-08-18 – 2015-08-19 (×2): 15 [IU] via SUBCUTANEOUS
  Filled 2015-08-18 (×3): qty 0.15

## 2015-08-18 MED ORDER — ATORVASTATIN CALCIUM 20 MG PO TABS
80.0000 mg | ORAL_TABLET | Freq: Every day | ORAL | Status: DC
  Administered 2015-08-18 – 2015-08-24 (×6): 80 mg via ORAL
  Filled 2015-08-18 (×8): qty 4

## 2015-08-18 MED ORDER — ONDANSETRON HCL 4 MG/2ML IJ SOLN
4.0000 mg | Freq: Four times a day (QID) | INTRAMUSCULAR | Status: DC | PRN
  Administered 2015-08-21: 4 mg via INTRAVENOUS
  Filled 2015-08-18: qty 2

## 2015-08-18 MED ORDER — LANTHANUM CARBONATE 500 MG PO CHEW
1000.0000 mg | CHEWABLE_TABLET | Freq: Three times a day (TID) | ORAL | Status: DC
  Administered 2015-08-18 – 2015-08-25 (×9): 1000 mg via ORAL
  Filled 2015-08-18 (×12): qty 2

## 2015-08-18 MED ORDER — VANCOMYCIN HCL IN DEXTROSE 1-5 GM/200ML-% IV SOLN: 1000.0000 mg | INTRAVENOUS | Status: DC

## 2015-08-18 MED ORDER — SEVELAMER CARBONATE 0.8 G PO PACK
0.8000 g | PACK | Freq: Three times a day (TID) | ORAL | Status: DC
  Administered 2015-08-19 – 2015-08-24 (×6): 0.8 g via ORAL
  Filled 2015-08-18 (×12): qty 1

## 2015-08-18 MED ORDER — CINACALCET HCL 30 MG PO TABS
90.0000 mg | ORAL_TABLET | Freq: Every day | ORAL | Status: DC
  Administered 2015-08-18 – 2015-08-25 (×4): 90 mg via ORAL
  Filled 2015-08-18 (×4): qty 3

## 2015-08-18 MED ORDER — ACETAMINOPHEN 325 MG PO TABS
650.0000 mg | ORAL_TABLET | Freq: Four times a day (QID) | ORAL | Status: DC | PRN
  Administered 2015-08-19: 650 mg via ORAL
  Filled 2015-08-18: qty 2

## 2015-08-18 MED ORDER — INSULIN ASPART 100 UNIT/ML ~~LOC~~ SOLN
0.0000 [IU] | Freq: Three times a day (TID) | SUBCUTANEOUS | Status: DC
  Administered 2015-08-18 (×3): 5 [IU] via SUBCUTANEOUS
  Administered 2015-08-20 – 2015-08-21 (×2): 2 [IU] via SUBCUTANEOUS
  Administered 2015-08-22 (×2): 3 [IU] via SUBCUTANEOUS
  Administered 2015-08-22 – 2015-08-24 (×3): 2 [IU] via SUBCUTANEOUS
  Administered 2015-08-24 – 2015-08-25 (×2): 3 [IU] via SUBCUTANEOUS
  Filled 2015-08-18: qty 2
  Filled 2015-08-18 (×2): qty 3
  Filled 2015-08-18 (×2): qty 2
  Filled 2015-08-18 (×2): qty 5
  Filled 2015-08-18 (×3): qty 2
  Filled 2015-08-18 (×2): qty 3
  Filled 2015-08-18: qty 5

## 2015-08-18 NOTE — Consult Note (Signed)
[facility] Vascular Consult Note  MRN : [id number]  Devin Richmond is a 50 y.o. ([birth year]) male who presents with chief complaint of  Chief Complaint  Patient presents with  . Open Wound  . Weakness  . Altered Mental Status  .  History of Present Illness: I am asked by Dr. Nemiah Commander to see the patient for worsening discharge and gangrenous changes to the left great toe.  He underwent an angiogram with revascularization about 6 weeks ago for necrotic ulcer on that left great toe. He is now having more purulent drainage and dark discoloration of the distal third to one half of the toe. The toe was clearly gangrenous and will need to be removed. His other 4 toes appear viable with good capillary refill. He has mild lower extremity swelling. He has a terrible historian and cannot provide much history at all. It is unclear what prompted his return admission from the group home with the exception of the worsening drainage and odor.  Current Facility-Administered Medications  Medication Dose Route Frequency Provider Last Rate Last Dose  . acetaminophen (TYLENOL) tablet 650 mg  650 mg Oral Q6H PRN Enid Baas, MD       Or  . acetaminophen (TYLENOL) suppository 650 mg  650 mg Rectal Q6H PRN Enid Baas, MD      . atorvastatin (LIPITOR) tablet 80 mg  80 mg Oral QHS Enid Baas, MD   80 mg at 08/18/15 0137  . atropine 1 % ophthalmic solution 1 drop  1 drop Left Eye BID Enid Baas, MD   1 drop at 08/18/15 1040  . carvedilol (COREG) tablet 12.5 mg  12.5 mg Oral BID WC Enid Baas, MD   12.5 mg at 08/18/15 0918  . cinacalcet (SENSIPAR) tablet 90 mg  90 mg Oral Q breakfast Enid Baas, MD   90 mg at 08/18/15 0917  . famotidine (PEPCID) tablet 20 mg  20 mg Oral Daily Enid Baas, MD   20 mg at 08/18/15 1040  . folic acid (FOLVITE) tablet 0.5 mg  500 mcg Oral Daily Enid Baas, MD   0.5 mg at 08/18/15 1040  . heparin  injection 5,000 Units  5,000 Units Subcutaneous Q8H Enid Baas, MD      . insulin aspart (novoLOG) injection 0-15 Units  0-15 Units Subcutaneous TID WC Adrian Saran, MD   5 Units at 08/18/15 1232  . insulin aspart (novoLOG) injection 0-5 Units  0-5 Units Subcutaneous QHS Sital Mody, MD      . insulin aspart (novoLOG) injection 3 Units  3 Units Subcutaneous TID WC Adrian Saran, MD   3 Units at 08/18/15 1231  . insulin detemir (LEVEMIR) injection 15 Units  15 Units Subcutaneous QHS Sital Mody, MD      . lanthanum (FOSRENOL) chewable tablet 1,000 mg  1,000 mg Oral TID WC Enid Baas, MD   1,000 mg at 08/18/15 1231  . lidocaine-prilocaine (EMLA) cream 1 application  1 application Topical Daily PRN Enid Baas, MD      . loratadine (CLARITIN) tablet 10 mg  10 mg Oral Daily Enid Baas, MD   10 mg at 08/18/15 1040  . meclizine (ANTIVERT) tablet 25 mg  25 mg Oral BID Enid Baas, MD   25 mg at 08/18/15 1040  . mirtazapine (REMERON) tablet 30 mg  30 mg Oral QHS Enid Baas, MD   30 mg at 08/18/15 0138  . ondansetron (ZOFRAN) tablet 4 mg  4 mg Oral Q6H PRN Radhika  Nemiah CommanderKalisetti, MD       Or  . ondansetron [facility](ZOFRAN) injection 4 mg  4 mg Intravenous Q6H PRN Enid Baasadhika Kalisetti, MD      . piperacillin-tazobactam (ZOSYN) IVPB 3.375 g  3.375 g Intravenous Q12H Adrian SaranSital Mody, MD   3.375 g at 08/18/15 0918  . sevelamer carbonate (RENVELA) packet 0.8 g  0.8 g Oral TID WC Melissa D Maccia, RPH      . vancomycin (VANCOCIN) 1,500 mg in sodium chloride 0.9 % 500 mL IVPB  1,500 mg Intravenous STAT Olene FlossMelissa D Maccia, RPH   1,500 mg at 08/18/15 1231  . [START ON 08/19/2015] vancomycin (VANCOCIN) IVPB 1000 mg/200 mL premix  1,000 mg Intravenous Q T,Th,Sa-HD Adrian SaranSital Mody, MD        Past Medical History:  Diagnosis Date  . Anemia   . CVA (cerebral infarction)   . Diabetes mellitus without complication ([name])   . Diabetic retinopathy ([name])   . Dialysis patient ([name])   . GERD (gastroesophageal reflux  disease)   . Hyperlipidemia   . Hypertension   . Kidney disease   . 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 Pancreatitis   . Stroke [facility]([name])     Past Surgical History:  Procedure Laterality Date  . fistula placement left upper arm    . PERIPHERAL VASCULAR CATHETERIZATION Right 07/20/2014   Procedure: A/V Shuntogram/Fistulagram;  Surgeon: Annice NeedyJason S Hubbard Seldon, MD;  Location: [facility] INVASIVE CV LAB;  Service: Cardiovascular;  Laterality: Right;  . PERIPHERAL VASCULAR CATHETERIZATION N/A 07/20/2014   Procedure: A/V Shunt Intervention;  Surgeon: Annice NeedyJason S Delight Bickle, MD;  Location: [facility] INVASIVE CV LAB;  Service: Cardiovascular;  Laterality: N/A;  . PERIPHERAL VASCULAR CATHETERIZATION Left 07/12/2015   Procedure: Lower Extremity Angiography;  Surgeon: Annice NeedyJason S Mirayah Wren, MD;  Location: [facility] INVASIVE CV LAB;  Service: Cardiovascular;  Laterality: Left;    Social History Social History  Substance Use Topics  . Smoking status: Never Smoker  . Smokeless tobacco: Never Used  . Alcohol use No  Lives in a group home  Family History Family History  Problem Relation Age of Onset  . Stroke Father   No reported history of bleeding disorders, clotting disorders, or autoimmune diseases but the patient is not really able to provide a good history  Allergies  Allergen Reactions  . Sulfa Antibiotics Other (See Comments)    unknown     REVIEW OF SYSTEMS (Negative unless checked)  Constitutional: [] Weight loss  [x] Fever  [] Chills Cardiac: [] Chest pain   [] Chest pressure   [] Palpitations   [] Shortness of breath when laying flat   [] Shortness of breath at rest   [] Shortness of breath with exertion. Vascular:  [] Pain in legs with walking   [] Pain in legs at rest   [] Pain in legs when laying flat   [] Claudication   [] Pain in feet when walking  [] Pain in feet at rest  [] Pain in feet when laying flat   [] History of DVT   [] Phlebitis   [] Swelling in legs   [] Varicose veins   [x] Non-healing ulcers Pulmonary:   [] Uses home oxygen    [] Productive cough   [] Hemoptysis   [] Wheeze  [] COPD   [] Asthma Neurologic:  [] Dizziness  [] Blackouts   [] Seizures   [] History of stroke   [] History of TIA  [] Aphasia   [] Temporary blindness   [] Dysphagia   [] Weakness or numbness in arms   [] Weakness or numbness in legs Musculoskeletal:  [] Arthritis   [] Joint swelling   [] Joint pain   [] Low back  pain Hematologic:  [] Easy bruising  [] Easy bleeding   [] Hypercoagulable state   [] Anemic  [] Hepatitis Gastrointestinal:  [] Blood in stool   [] Vomiting blood  [] Gastroesophageal reflux/heartburn   [] Difficulty swallowing. Genitourinary:  [x] Chronic kidney disease   [] Difficult urination  [] Frequent urination  [] Burning with urination   [] Blood in urine Skin:  [] Rashes   [x] Ulcers   [x] Wounds Psychological:  [] History of anxiety   []  History of major depression.  Physical Examination  Vitals:   08/18/15 0250 08/18/15 0518 08/18/15 0520 08/18/15 0921  BP:   120/84 118/63  Pulse: 89 92 90 94  Resp:  20 20   Temp:  98.2 F (36.8 C)    TempSrc:  Oral    SpO2: 98% 98%    Weight:      Height:       Body mass index is 25.74 kg/m. Gen:  WD/WN, NAD Head: /AT, No temporalis wasting. Prominent temp pulse not noted. Ear/Nose/Throat: Hearing grossly intact, nares w/o erythema or drainage, oropharynx w/o Erythema/Exudate Eyes: Diminished vision. Sclera are nonicteric. Neck: Supple, no nuchal rigidity.  No JVD.  Pulmonary:  Good air movement, equal bilaterally.  Cardiac: RRR, normal S1, S2, no Murmurs, rubs or gallops. Vascular:  Vessel Right Left  Radial Palpable Palpable  Ulnar Palpable Palpable  Brachial Palpable Palpable  Carotid Palpable, without bruit Palpable, without bruit  Aorta Not palpable N/A  Femoral Palpable Palpable  Popliteal 1+, Palpable Not Palpable  PT Not Palpable 1+, Palpable  DP Palpable Not Palpable   Gastrointestinal: soft, non-tender/non-distended. No guarding/reflex. No masses, surgical incisions, or  scars. Musculoskeletal: M/S 5/5 throughout.  No deformity or atrophy. No edema. Neurologic: CN 2-12 intact. Pain and light touch intact in extremities.  Symmetrical.  Speech is fluent. Motor exam as listed above. Psychiatric: Patient is a very poor historian with obvious cognitive difficulties Dermatologic: Patient with purulent discharge and gangrenous changes of the distal third to one half of the left great toe. The other 4 toes appear viable. Lymph : No Cervical, Axillary, or Inguinal lymphadenopathy.       CBC Lab Results  Component Value Date   WBC 22.6 (H) 08/18/2015   HGB 11.7 (L) 08/18/2015   HCT 35.6 (L) 08/18/2015   MCV 95.3 08/18/2015   PLT 239 08/18/2015    BMET    Component Value Date/Time   NA 139 08/18/2015 0441   NA 141 04/14/2014 0425   K 5.0 08/18/2015 0441   K 3.6 04/14/2014 0425   CL 94 (L) 08/18/2015 0441   CL 101 04/14/2014 0425   CO2 31 08/18/2015 0441   CO2 28 04/14/2014 0425   GLUCOSE 256 (H) 08/18/2015 0441   GLUCOSE 186 (H) 04/14/2014 0425   BUN 71 (H) 08/18/2015 0441   BUN 49 (H) 04/14/2014 0425   CREATININE 13.51 (H) 08/18/2015 0441   CREATININE 14.42 (H) 04/14/2014 0425   CALCIUM 8.0 (L) 08/18/2015 0441   CALCIUM 9.6 04/14/2014 0425   GFRNONAA 4 (L) 08/18/2015 0441   GFRNONAA 3 (L) 04/14/2014 0425   GFRAA 4 (L) 08/18/2015 0441   GFRAA 4 (L) 04/14/2014 0425   Estimated Creatinine Clearance: 7 mL/min (by C-G formula based on SCr of 13.51 mg/dL).  COAG Lab Results  Component Value Date   INR 1.13 06/24/2015    Radiology Dg Chest Portable 1 View  Result Date: 08/17/2015 CLINICAL DATA:  Cough.  Weakness since Sunday. EXAM: PORTABLE CHEST 1 VIEW COMPARISON:  06/24/2015 FINDINGS: Midline trachea.  Normal heart size and  mediastinal contours. Sharp costophrenic angles.  No pneumothorax.  Clear lungs. Patient rotated minimally right. IMPRESSION: No active disease. Electronically Signed   By: Jeronimo GreavesKyle  Talbot M.D.   On: 08/17/2015 19:15   Dg  Foot Complete Left  Result Date: 08/17/2015 CLINICAL DATA:  Ischemic great toe. Left great toe wound for 2 months. EXAM: LEFT FOOT - COMPLETE 3+ VIEW COMPARISON:  None. FINDINGS: There is soft tissue ulceration involving the great toe at the level of the distal phalanx with heterogeneous lucency compatible with soft tissue emphysema. No definite underlying osseous erosion is identified to indicate osteomyelitis. No acute fracture or dislocation is identified. There are dorsiflexion deformities at the second through fifth MCP joints. Diffuse atherosclerotic vascular calcification is present. There is diffuse soft tissue swelling about the forefoot. IMPRESSION: Soft tissue ulceration and soft tissue emphysema involving the left great toe. No definite radiographic evidence of osteomyelitis. Electronically Signed   By: Sebastian AcheAllen  Grady M.D.   On: 08/17/2015 17:57      Assessment/Plan 1. PAD with gangrene left lower extremity. Given the fact that we have limited radiologic evaluation with arterial duplex at this institution and the fact that CT angiography is very poor for evaluating the tibial vessels, I feel that we must perform an angiogram for further evaluation of his perfusion. If his perfusion is intact, a left great toe ray amputation would likely be required. If his perfusion is not intact, will attempt revascularization. Angiogram will be scheduled for tomorrow. This is clearly a serious and limb threatening situation and the likelihood of major amputation is reasonably high. Continued attempts at limb salvage would certainly be reasonable. 2. Diabetes mellitus. Glucose control important for wound healing as well as reduction of progression of atherosclerotic disease.  3. End-stage renal disease. Clearly increases the chance of limb loss.  4. Hypertension. Stable outpatient medications.    Roshaunda Starkey, MD  08/18/2015 12:44 PM

## 2015-08-18 NOTE — Consult Note (Signed)
Pharmacy Antibiotic Note  Devin Richmond is a 50 y.o. male admitted on 08/17/2015 with gangrene of L toe.  Pharmacy has been consulted for unasyn dosing. Pt is an ESRD pt on HD tu, turs, sat  Plan: unasyn 3g q 24hr. Will scheudle for 2200. Needs to be given after HD on dialysis days  Height: 6' (182.9 cm) Weight: 189 lb 13.1 oz (86.1 kg) IBW/kg (Calculated) : 77.6  Temp (24hrs), Avg:97.9 F (36.6 C), Min:97.5 F (36.4 C), Max:98.4 F (36.9 C)   Recent Labs Lab 08/17/15 1804 08/18/15 0441  WBC 21.6* 22.6*  CREATININE 18.25* 13.51*    Estimated Creatinine Clearance: 7 mL/min (by C-G formula based on SCr of 13.51 mg/dL).    Allergies  Allergen Reactions  . Sulfa Antibiotics Other (See Comments)    unknown    Antimicrobials this admission: vancomycin 8/15 >> 8/16 zosyn 8/15 >> 8/16 unasyn 8/16>>  Dose adjustments this admission:   Microbiology results: 8/15 BCx: Ng <24 hrs 8/15 MRSA PCR: neg  Thank you for allowing pharmacy to be a part of this patient's care.  Olene FlossMelissa D Aslan Montagna, Pharm.D Clinical Pharmacist  08/18/2015 2:19 PM

## 2015-08-18 NOTE — Progress Notes (Signed)
POST HD VITALS 

## 2015-08-18 NOTE — Consult Note (Signed)
WOC Nurse wound consult note Reason for Consult: Gangrenous left great toe.  Vascular surgery has evaluated and recommended amputation.  Bedside RN requesting recommendations for dressing at this time.  Wound type:Chronic vascular and neuropathic ulcer.  Pressure Ulcer POA: N/A Measurement: 2 cm necrotic tip circumferentially to left great toe.  Evaluated by vascular and amputation is recommended.  Dry dressing at this time.  Wound ZOX:[id number]:Necrotic  Drainage (amount, consistency, odor) Scant purulence noted.  Foul necrotic odor.   Periwound: Erythema Dressing procedure/placement/frequency:Cleanse left great toe with NS.  Apply dry dressing and secure with tape. Change daily.  Will not follow at this time.  Please re-consult if needed.  Maple HudsonKaren Malania Gawthrop RN BSN CWON Pager [phone]

## 2015-08-18 NOTE — Progress Notes (Signed)
END OF HD 

## 2015-08-18 NOTE — Consult Note (Signed)
[facility]ious [facility]     Reason for Consult Gangrene   Referring Physician: Claria Dice Date of Admission:  08/17/2015   Active Problems:   Ischemic foot   HPI: Devin Richmond is a 50 y.o. male admitted 8/15 with progressive gangrene of L great toe. Followed by podiatry and vascular and 6 weeks ago went revascularization.  However had progressive purulence and discoloration. He has a hx of DM, ESRD on HD, HTN and lives in a group home. He is a poor historian due to his multiple medical issues.    Past Medical History:  Diagnosis Date  . Anemia   . CVA (cerebral infarction)   . Diabetes mellitus without complication ([place])   . Diabetic retinopathy ([place])   . Dialysis patient ([place])   . GERD (gastroesophageal reflux disease)   . Hyperlipidemia   . Hypertension   . Kidney disease   . 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 Pancreatitis   . Stroke [facility])    Past Surgical History:  Procedure Laterality Date  . fistula placement left upper arm    . PERIPHERAL VASCULAR CATHETERIZATION Right 07/20/2014   Procedure: A/V Shuntogram/Fistulagram;  Surgeon: Algernon Huxley, MD;  Location: [place] [facility];  Service: Cardiovascular;  Laterality: Right;  . PERIPHERAL VASCULAR CATHETERIZATION N/A 07/20/2014   Procedure: A/V Shunt Intervention;  Surgeon: Algernon Huxley, MD;  Location: [place] [facility];  Service: Cardiovascular;  Laterality: N/A;  . PERIPHERAL VASCULAR CATHETERIZATION Left 07/12/2015   Procedure: Lower Extremity Angiography;  Surgeon: Algernon Huxley, MD;  Location: [place] [facility];  Service: Cardiovascular;  Laterality: Left;   Social History  Substance Use Topics  . Smoking status: Never Smoker  . Smokeless tobacco: Never Used  . Alcohol use No   Family History  Problem Relation Age of Onset  . Stroke Father     Allergies:  Allergies  Allergen Reactions  . Sulfa Antibiotics Other (See Comments)    unknown    Current antibiotics: Antibiotics Given  (last 72 hours)    Date/Time Action Medication Dose Rate   08/18/15 0918 Given   piperacillin-tazobactam (ZOSYN) IVPB 3.375 g 3.375 g 12.5 mL/hr   08/18/15 1231 Given   vancomycin (VANCOCIN) 1,500 mg in sodium chloride 0.9 % 500 mL IVPB 1,500 mg 250 mL/hr      MEDICATIONS: . atorvastatin  80 mg Oral QHS  . atropine  1 drop Left Eye BID  . carvedilol  12.5 mg Oral BID WC  . cinacalcet  90 mg Oral Q breakfast  . famotidine  20 mg Oral Daily  . folic acid  716 mcg Oral Daily  . heparin  5,000 Units Subcutaneous Q8H  . insulin aspart  0-15 Units Subcutaneous TID WC  . insulin aspart  0-5 Units Subcutaneous QHS  . insulin aspart  3 Units Subcutaneous TID WC  . insulin detemir  15 Units Subcutaneous QHS  . lanthanum  1,000 mg Oral TID WC  . loratadine  10 mg Oral Daily  . meclizine  25 mg Oral BID  . mirtazapine  30 mg Oral QHS  . piperacillin-tazobactam (ZOSYN)  IV  3.375 g Intravenous Q12H  . sevelamer carbonate  0.8 g Oral TID WC  . vancomycin  1,500 mg Intravenous STAT  . [START ON 08/19/2015] vancomycin  1,000 mg Intravenous Q T,Th,Sa-HD    Review of Systems - unable to obtain   OBJECTIVE: Temp:  [97.5 F (36.4 C)-98.4 F (36.9 C)] 98.4  F (36.9 C) (08/16 1257) Pulse Rate:  [81-95] 95 (08/16 1257) Resp:  [16-20] 20 (08/16 0520) BP: (82-151)/(44-123) 98/62 (08/16 1257) SpO2:  [84 %-100 %] 100 % (08/16 1257) Weight:  [86.1 kg (189 lb 13.1 oz)-86.2 kg (190 lb)] 86.1 kg (189 lb 13.1 oz) (08/15 2050) Physical Exam  Constitutional: lying in bed, barely verbal, HENT: anicteric Mouth/Throat: Oropharynx is clear and moist. No oropharyngeal exudate.  Cardiovascular: Normal rate, regular rhythm and normal heart sounds. Pulmonary/Chest: Effort normal and breath sounds normal. No respiratory distress. He has no wheezes.  Abdominal: Soft. Bowel sounds are normal.He has no cervical adenopathy.  Neurological:minimally interactive Ext no edema vasc diminished pulsed DP and PT bil,  feet are both cool Skin: L great toe with tip with gangrenous changes, foul odor, no drainage noted,  Psychiatric: He has a normal mood and affect. His behavior is normal.     LABS: Results for orders placed or performed during the hospital encounter of 08/17/15 (from the past 48 hour(s))  Glucose, capillary     Status: Abnormal   Collection Time: 08/17/15  4:34 PM  Result Value Ref Range   Glucose-Capillary 335 (H) 65 - 99 mg/dL  Basic metabolic panel     Status: Abnormal   Collection Time: 08/17/15  6:04 PM  Result Value Ref Range   Sodium 136 135 - 145 mmol/L   Potassium 5.8 (H) 3.5 - 5.1 mmol/L   Chloride 94 (L) 101 - 111 mmol/L   CO2 25 22 - 32 mmol/L   Glucose, Bld 316 (H) 65 - 99 mg/dL   BUN 89 (H) 6 - 20 mg/dL    Comment: RESULT CONFIRMED BY MANUAL DILUTION JUW    Creatinine, Ser 18.25 (H) 0.61 - 1.24 mg/dL   Calcium 7.9 (L) 8.9 - 10.3 mg/dL   GFR calc non Af Amer 3 (L) >60 mL/min   GFR calc Af Amer 3 (L) >60 mL/min    Comment: (NOTE) The eGFR has been calculated using the CKD EPI equation. This calculation has not been validated in all clinical situations. eGFR's persistently <60 mL/min signify possible Chronic Kidney Disease.    Anion gap 17 (H) 5 - 15  CBC     Status: Abnormal   Collection Time: 08/17/15  6:04 PM  Result Value Ref Range   WBC 21.6 (H) 3.8 - 10.6 K/uL   RBC 3.85 (L) 4.40 - 5.90 MIL/uL   Hemoglobin 12.1 (L) 13.0 - 18.0 g/dL   HCT 37.1 (L) 40.0 - 52.0 %   MCV 96.2 80.0 - 100.0 fL   MCH 31.5 26.0 - 34.0 pg   MCHC 32.7 32.0 - 36.0 g/dL   RDW 16.3 (H) 11.5 - 14.5 %   Platelets 227 150 - 440 K/uL  Beta-hydroxybutyric acid     Status: Abnormal   Collection Time: 08/17/15  6:04 PM  Result Value Ref Range   Beta-Hydroxybutyric Acid 0.40 (H) 0.05 - 0.27 mmol/L  Culture, blood (Routine X 2) w Reflex to ID Panel     Status: None (Preliminary result)   Collection Time: 08/17/15  6:10 PM  Result Value Ref Range   Specimen Description BLOOD     Special Requests 4ML    Culture NO GROWTH < 24 HOURS    Report Status PENDING   Culture, blood (Routine X 2) w Reflex to ID Panel     Status: None (Preliminary result)   Collection Time: 08/17/15  6:40 PM  Result Value Ref Range   Specimen  Description BLOOD    Special Requests 4ML    Culture NO GROWTH < 24 HOURS    Report Status PENDING   Glucose, capillary     Status: Abnormal   Collection Time: 08/17/15  8:01 PM  Result Value Ref Range   Glucose-Capillary 383 (H) 65 - 99 mg/dL  Glucose, capillary     Status: Abnormal   Collection Time: 08/18/15 12:50 AM  Result Value Ref Range   Glucose-Capillary 219 (H) 65 - 99 mg/dL   Comment 1 Notify RN   MRSA PCR Screening     Status: None   Collection Time: 08/18/15  3:00 AM  Result Value Ref Range   MRSA by PCR NEGATIVE NEGATIVE    Comment:        The GeneXpert MRSA Assay (FDA approved for NASAL specimens only), is one component of a comprehensive MRSA colonization surveillance program. It is not intended to diagnose MRSA infection nor to guide or monitor treatment for MRSA infections.   Basic metabolic panel     Status: Abnormal   Collection Time: 08/18/15  4:41 AM  Result Value Ref Range   Sodium 139 135 - 145 mmol/L   Potassium 5.0 3.5 - 5.1 mmol/L   Chloride 94 (L) 101 - 111 mmol/L   CO2 31 22 - 32 mmol/L   Glucose, Bld 256 (H) 65 - 99 mg/dL   BUN 71 (H) 6 - 20 mg/dL   Creatinine, Ser 13.51 (H) 0.61 - 1.24 mg/dL   Calcium 8.0 (L) 8.9 - 10.3 mg/dL   GFR calc non Af Amer 4 (L) >60 mL/min   GFR calc Af Amer 4 (L) >60 mL/min    Comment: (NOTE) The eGFR has been calculated using the CKD EPI equation. This calculation has not been validated in all clinical situations. eGFR's persistently <60 mL/min signify possible Chronic Kidney Disease.    Anion gap 14 5 - 15  CBC     Status: Abnormal   Collection Time: 08/18/15  4:41 AM  Result Value Ref Range   WBC 22.6 (H) 3.8 - 10.6 K/uL   RBC 3.73 (L) 4.40 - 5.90 MIL/uL    Hemoglobin 11.7 (L) 13.0 - 18.0 g/dL   HCT 35.6 (L) 40.0 - 52.0 %   MCV 95.3 80.0 - 100.0 fL   MCH 31.2 26.0 - 34.0 pg   MCHC 32.8 32.0 - 36.0 g/dL   RDW 17.0 (H) 11.5 - 14.5 %   Platelets 239 150 - 440 K/uL  Glucose, capillary     Status: Abnormal   Collection Time: 08/18/15  8:11 AM  Result Value Ref Range   Glucose-Capillary 246 (H) 65 - 99 mg/dL  Glucose, capillary     Status: Abnormal   Collection Time: 08/18/15 11:48 AM  Result Value Ref Range   Glucose-Capillary 210 (H) 65 - 99 mg/dL   No components found for: ESR, C REACTIVE PROTEIN MICRO: Recent Results (from the past 720 hour(s))  Culture, blood (Routine X 2) w Reflex to ID Panel     Status: None (Preliminary result)   Collection Time: 08/17/15  6:10 PM  Result Value Ref Range Status   Specimen Description BLOOD  Final   Special Requests 4ML  Final   Culture NO GROWTH < 24 HOURS  Final   Report Status PENDING  Incomplete  Culture, blood (Routine X 2) w Reflex to ID Panel     Status: None (Preliminary result)   Collection Time: 08/17/15  6:40 PM  Result  Value Ref Range Status   Specimen Description BLOOD  Final   Special Requests 4ML  Final   Culture NO GROWTH < 24 HOURS  Final   Report Status PENDING  Incomplete  MRSA PCR Screening     Status: None   Collection Time: 08/18/15  3:00 AM  Result Value Ref Range Status   MRSA by PCR NEGATIVE NEGATIVE Final    Comment:        The GeneXpert MRSA Assay (FDA approved for NASAL specimens only), is one component of a comprehensive MRSA colonization surveillance program. It is not intended to diagnose MRSA infection nor to guide or monitor treatment for MRSA infections.     IMAGING: Dg Chest Portable 1 View  Result Date: 08/17/2015 CLINICAL DATA:  Cough.  Weakness since Sunday. EXAM: PORTABLE CHEST 1 VIEW COMPARISON:  06/24/2015 FINDINGS: Midline trachea.  Normal heart size and mediastinal contours. Sharp costophrenic angles.  No pneumothorax.  Clear lungs. Patient  rotated minimally right. IMPRESSION: No active disease. Electronically Signed   By: Abigail Miyamoto M.D.   On: 08/17/2015 19:15   Dg Foot Complete Left  Result Date: 08/17/2015 CLINICAL DATA:  Ischemic great toe. Left great toe wound for 2 months. EXAM: LEFT FOOT - COMPLETE 3+ VIEW COMPARISON:  None. FINDINGS: There is soft tissue ulceration involving the great toe at the level of the distal phalanx with heterogeneous lucency compatible with soft tissue emphysema. No definite underlying osseous erosion is identified to indicate osteomyelitis. No acute fracture or dislocation is identified. There are dorsiflexion deformities at the second through fifth MCP joints. Diffuse atherosclerotic vascular calcification is present. There is diffuse soft tissue swelling about the forefoot. IMPRESSION: Soft tissue ulceration and soft tissue emphysema involving the left great toe. No definite radiographic evidence of osteomyelitis. Electronically Signed   By: Logan Bores M.D.   On: 08/17/2015 17:57    Assessment:   Charvez Voorhies is a 50 y.o. male with ESRD, HTN, PVD, DM admitted with gangrenous changes and infection of L great toe.  Will undergo angiogram and possible revascularization but will need amputation as well of at least the toe.On admit wbc was 22, no fevers and started on vanco zosyn. He had Grp G strep bacteremia in June  And was at one point treated with keflex. Unclear other abx use recently. He is allergic to bactrim   Recommendations DC vanco - MRSA screen negative Can change zosyn to unasyn  WIll follow  Thank you very much for allowing me to participate in the care of this patient. Please call with questions.   Cheral Marker. Ola Spurr, MD

## 2015-08-18 NOTE — Progress Notes (Signed)
[employer] [facility] [place] [facility]   PATIENT NAME: Devin Richmond    MR#:  [id number]  DATE OF BIRTH:  06-Apr-1965  SUBJECTIVE:   patient here with Malodorous discharge from left toe.  REVIEW OF SYSTEMS:    Review of Systems  Constitutional: Negative.  Negative for chills, fever and malaise/fatigue.  HENT: Negative.  Negative for ear discharge, ear pain, hearing loss, nosebleeds and sore throat.        Left eye blindness  Eyes: Negative.  Negative for blurred vision and pain.  Respiratory: Negative.  Negative for cough, hemoptysis, shortness of breath and wheezing.   Cardiovascular: Negative.  Negative for chest pain, palpitations and leg swelling.  Gastrointestinal: Negative.  Negative for abdominal pain, blood in stool, diarrhea, nausea and vomiting.  Genitourinary: Negative.  Negative for dysuria.  Musculoskeletal: Negative.  Negative for back pain.  Skin: Negative.        Left big toe gangrenous malodorous  Neurological: Negative for dizziness, tremors, speech change, focal weakness, seizures and headaches.       Hx cva  Slow when he speaks  Endo/Heme/Allergies: Negative.  Does not bruise/bleed easily.  Psychiatric/Behavioral: Negative.  Negative for depression, hallucinations and suicidal ideas.    Tolerating Diet:yes      DRUG ALLERGIES:   Allergies  Allergen Reactions  . Sulfa Antibiotics Other (See Comments)    unknown    VITALS:  Blood pressure 118/63, pulse 94, temperature 98.2 F (36.8 C), temperature source Oral, resp. rate 20, height 6' (1.829 m), weight 86.1 kg (189 lb 13.1 oz), SpO2 98 %.  PHYSICAL EXAMINATION:   Physical Exam  Constitutional: He is oriented to person, place, and time and well-developed, well-nourished, and in no distress. No distress.  HENT:  Head: Normocephalic.  Eyes: No scleral icterus.  Neck: Normal range of motion. Neck supple. No JVD present. No tracheal deviation present.  Cardiovascular: Normal rate, regular  rhythm and normal heart sounds.  Exam reveals no gallop and no friction rub.   No murmur heard. Pulmonary/Chest: Effort normal and breath sounds normal. No respiratory distress. He has no wheezes. He has no rales. He exhibits no tenderness.  Abdominal: Soft. Bowel sounds are normal. He exhibits no distension and no mass. There is no tenderness. There is no rebound and no guarding.  Musculoskeletal: Normal range of motion. He exhibits no edema.  Neurological: He is alert and oriented to person, place, and time.  Slow to speak  Skin: Skin is warm. No rash noted. No erythema.  Big toe foul smelling Necrotic toe, purulent discharge  Psychiatric: Affect and judgment normal.      LABORATORY PANEL:   CBC  Recent Labs Lab 08/18/15 0441  WBC 22.6*  HGB 11.7*  HCT 35.6*  PLT 239   ------------------------------------------------------------------------------------------------------------------  Chemistries   Recent Labs Lab 08/18/15 0441  NA 139  K 5.0  CL 94*  CO2 31  GLUCOSE 256*  BUN 71*  CREATININE 13.51*  CALCIUM 8.0*   ------------------------------------------------------------------------------------------------------------------  Cardiac Enzymes No results for input(s): TROPONINI in the last 168 hours. ------------------------------------------------------------------------------------------------------------------  RADIOLOGY:  Dg Chest Portable 1 View  Result Date: 08/17/2015 CLINICAL DATA:  Cough.  Weakness since Sunday. EXAM: PORTABLE CHEST 1 VIEW COMPARISON:  06/24/2015 FINDINGS: Midline trachea.  Normal heart size and mediastinal contours. Sharp costophrenic angles.  No pneumothorax.  Clear lungs. Patient rotated minimally right. IMPRESSION: No active disease. Electronically Signed   By: Jeronimo GreavesKyle  Talbot M.D.   On: 08/17/2015 19:15  Dg Foot Complete Left  Result Date: 08/17/2015 CLINICAL DATA:  Ischemic great toe. Left great toe wound for 2 months. EXAM: LEFT  FOOT - COMPLETE 3+ VIEW COMPARISON:  None. FINDINGS: There is soft tissue ulceration involving the great toe at the level of the distal phalanx with heterogeneous lucency compatible with soft tissue emphysema. No definite underlying osseous erosion is identified to indicate osteomyelitis. No acute fracture or dislocation is identified. There are dorsiflexion deformities at the second through fifth MCP joints. Diffuse atherosclerotic vascular calcification is present. There is diffuse soft tissue swelling about the forefoot. IMPRESSION: Soft tissue ulceration and soft tissue emphysema involving the left great toe. No definite radiographic evidence of osteomyelitis. Electronically Signed   By: Sebastian AcheAllen  Grady M.D.   On: 08/17/2015 17:57     ASSESSMENT AND PLAN:   Devin Brassric Cerros  is a 50 y.o. male with a known history of End-stage renal disease on T-Th-S dialysis, neuropathy, stroke, peripheral vascular disease, diabetic retinopathy, hypertension, diabetes mellitus presents from group home secondary to worsening left great toe gangrene.                   1 Sepsis due to left great toe gangrene with probable underlying osteomyelitis: He presented with leukocytosis and hypotension.  Patient has been evaluated by podiatry and will need amputation at some level. Anus to follow-up on vascular surgery recommendations. Follow-up with ID consult.. Continue Zosyn and vancomycin Appreciate wound care consult. Cleanse left great toe with NS.  Apply dry dressing and secure with tape. Change daily  2 hyperkalemia: resolved after HD   3 ESRD on hemodialysis: Appreciate Nephrology consult.  4 diabetes mellitus uncontrolled with DM retinopathy-check hemoglobin A1c and obtain diabetes coordinator consult.  Continue Levemir and pre-meal insulin with sliding scale insulin and ADA diet.  5. History of CVA: Continue statin however Plavix is on hold due to need for surgery.  Speech consult for swallow  evaluation    Management plans discussed with the patient and he is in agreement.  CODE STATUS: full  TOTAL TIME TAKING CARE OF THIS PATIENT: 30 minutes.     POSSIBLE D/C 2-3 days, DEPENDING ON CLINICAL CONDITION.   Vale Peraza M.D on 08/18/2015 at 12:16 PM  Between 7am to 6pm - Pager - [phone] After 6pm go to [email or web address] - password [email or web address]  [facility]  Office  [phone]  CC: Primary care physician; Leotis ShamesSingh,Jasmine, MD  Note: This dictation was prepared with Dragon dictation along with smaller phrase technology. Any transcriptional errors that result from this process are unintentional.

## 2015-08-18 NOTE — Progress Notes (Signed)
POST HD ASSESSMENT 

## 2015-08-18 NOTE — Progress Notes (Signed)
[facility]  ROUNDING NOTE   Subjective:  Patient seen by vascular surgery. He will need another angiogram to evaluate circulation to his left lower extremity. He completed hemodialysis yesterday and is due for dialysis again tomorrow.    Objective:  Vital signs in last 24 hours:  Temp:  [97.5 F (36.4 C)-98.4 F (36.9 C)] 98.4 F (36.9 C) (08/16 1257) Pulse Rate:  [81-95] 95 (08/16 1257) Resp:  [16-20] 20 (08/16 0520) BP: (82-151)/(44-123) 98/62 (08/16 1257) SpO2:  [84 %-100 %] 100 % (08/16 1257) Weight:  [86.1 kg (189 lb 13.1 oz)-86.2 kg (190 lb)] 86.1 kg (189 lb 13.1 oz) (08/15 2050)  Weight change:  Filed Weights   08/17/15 1627 08/17/15 2050  Weight: 86.2 kg (190 lb) 86.1 kg (189 lb 13.1 oz)    Intake/Output: I/O last 3 completed shifts: In: 80 [P.O.:80] Out: 1991 [Other:1991]   Intake/Output this shift:  Total I/O In: 274 [P.O.:240; IV Piggyback:34] Out: 0   Physical Exam: General: No acute distress  Head: Normocephalic, atraumatic. Moist oral mucosal membranes  Eyes: Anicteric  Neck: Supple, trachea midline  Lungs:  Clear to auscultation, normal effort  Heart: S1S2 no rubs  Abdomen:  Soft, nontender,   Extremities: trace peripheral edema, necrotic left first toe   Neurologic: Nonfocal, moving all four extremities  Skin: No lesions  Access: Left upper extremity AV fistula     Basic Metabolic Panel:  Recent Labs Lab 08/17/15 1804 08/18/15 0441  NA 136 139  K 5.8* 5.0  CL 94* 94*  CO2 25 31  GLUCOSE 316* 256*  BUN 89* 71*  CREATININE 18.25* 13.51*  CALCIUM 7.9* 8.0*    Liver Function Tests: No results for input(s): AST, ALT, ALKPHOS, BILITOT, PROT, ALBUMIN in the last 168 hours. No results for input(s): LIPASE, AMYLASE in the last 168 hours. No results for input(s): AMMONIA in the last 168 hours.  CBC:  Recent Labs Lab 08/17/15 1804 08/18/15 0441  WBC 21.6* 22.6*  HGB 12.1* 11.7*  HCT 37.1* 35.6*  MCV 96.2 95.3  PLT  227 239    Cardiac Enzymes: No results for input(s): CKTOTAL, CKMB, CKMBINDEX, TROPONINI in the last 168 hours.  BNP: Invalid input(s): POCBNP  CBG:  Recent Labs Lab 08/17/15 1634 08/17/15 2001 08/18/15 0050 08/18/15 0811 08/18/15 1148  GLUCAP 335* 383* 219* 246* 210*    Microbiology: Results for orders placed or performed during the hospital encounter of 08/17/15  Culture, blood (Routine X 2) w Reflex to ID Panel     Status: None (Preliminary result)   Collection Time: 08/17/15  6:10 PM  Result Value Ref Range Status   Specimen Description BLOOD  Final   Special Requests 4ML  Final   Culture NO GROWTH < 24 HOURS  Final   Report Status PENDING  Incomplete  Culture, blood (Routine X 2) w Reflex to ID Panel     Status: None (Preliminary result)   Collection Time: 08/17/15  6:40 PM  Result Value Ref Range Status   Specimen Description BLOOD  Final   Special Requests 4ML  Final   Culture NO GROWTH < 24 HOURS  Final   Report Status PENDING  Incomplete  MRSA PCR Screening     Status: None   Collection Time: 08/18/15  3:00 AM  Result Value Ref Range Status   MRSA by PCR NEGATIVE NEGATIVE Final    Comment:        The GeneXpert MRSA Assay (FDA approved for NASAL specimens only),  is one component of a comprehensive MRSA colonization surveillance program. It is not intended to diagnose MRSA infection nor to guide or monitor treatment for MRSA infections.     Coagulation Studies: No results for input(s): LABPROT, INR in the last 72 hours.  Urinalysis: No results for input(s): COLORURINE, LABSPEC, PHURINE, GLUCOSEU, HGBUR, BILIRUBINUR, KETONESUR, PROTEINUR, UROBILINOGEN, NITRITE, LEUKOCYTESUR in the last 72 hours.  Invalid input(s): APPERANCEUR    Imaging: Dg Chest Portable 1 View  Result Date: 08/17/2015 CLINICAL DATA:  Cough.  Weakness since Sunday. EXAM: PORTABLE CHEST 1 VIEW COMPARISON:  06/24/2015 FINDINGS: Midline trachea.  Normal heart size and mediastinal  contours. Sharp costophrenic angles.  No pneumothorax.  Clear lungs. Patient rotated minimally right. IMPRESSION: No active disease. Electronically Signed   By: Jeronimo Greaves M.D.   On: 08/17/2015 19:15   Dg Foot Complete Left  Result Date: 08/17/2015 CLINICAL DATA:  Ischemic great toe. Left great toe wound for 2 months. EXAM: LEFT FOOT - COMPLETE 3+ VIEW COMPARISON:  None. FINDINGS: There is soft tissue ulceration involving the great toe at the level of the distal phalanx with heterogeneous lucency compatible with soft tissue emphysema. No definite underlying osseous erosion is identified to indicate osteomyelitis. No acute fracture or dislocation is identified. There are dorsiflexion deformities at the second through fifth MCP joints. Diffuse atherosclerotic vascular calcification is present. There is diffuse soft tissue swelling about the forefoot. IMPRESSION: Soft tissue ulceration and soft tissue emphysema involving the left great toe. No definite radiographic evidence of osteomyelitis. Electronically Signed   By: Sebastian Ache M.D.   On: 08/17/2015 17:57     Medications:     . atorvastatin  80 mg Oral QHS  . atropine  1 drop Left Eye BID  . carvedilol  12.5 mg Oral BID WC  . cinacalcet  90 mg Oral Q breakfast  . famotidine  20 mg Oral Daily  . folic acid  500 mcg Oral Daily  . heparin  5,000 Units Subcutaneous Q8H  . insulin aspart  0-15 Units Subcutaneous TID WC  . insulin aspart  0-5 Units Subcutaneous QHS  . insulin aspart  3 Units Subcutaneous TID WC  . insulin detemir  15 Units Subcutaneous QHS  . lanthanum  1,000 mg Oral TID WC  . loratadine  10 mg Oral Daily  . meclizine  25 mg Oral BID  . mirtazapine  30 mg Oral QHS  . piperacillin-tazobactam (ZOSYN)  IV  3.375 g Intravenous Q12H  . sevelamer carbonate  0.8 g Oral TID WC  . vancomycin  1,500 mg Intravenous STAT  . [START ON 08/19/2015] vancomycin  1,000 mg Intravenous Q T,Th,Sa-HD     Assessment/ Plan:  50 y.o. male with  past medical history of end-stage renal disease on hemodialysis TTS, diabetes will start 2, hypertension, history of CVA, anemia of chronic kidney disease, secondary hyperparathyroidism, resides at assisted living facility, peripheral vascular disease, necrotic left first toe  [facility]/TTS  1. End-stage renal disease TTS: patient due for hemodialysis again tomorrow.  He completed hemodialysis yesterday.  2. Hyperkalemia. Potassium down to 5.0 today.  Continue to monitor.  3. Secondary hyperparathyroidism.  Phosphorus was not drawn with dialysis yesterday.  We will reorder another phosphorus for tomorrow.  4. Anemia of chronic kidney disease. Hemoglobin currently 11.7.  Continue to hold Epogen for now.  5.  Gangrene of left first toe/peripheral vascular disease. Appreciate input from vascular surgery.  The patient will need angiogram.  Further plan to be  determined depending upon the level of peripheral vascular disease in the left lower extremity.   LOS: 1 Meeah Totino 8/16/20172:01 PM

## 2015-08-18 NOTE — Evaluation (Addendum)
Clinical/Bedside Swallow Evaluation Patient Details  Name:  Devin Richmond MRN: [id number] Date of Birth: 13-Jul-1965  Today's Date: 08/18/2015 Time: SLP Start Time (ACUTE ONLY): 1100 SLP Stop Time (ACUTE ONLY): 1200 SLP Time Calculation (min) (ACUTE ONLY): 60 min  Past Medical History:  Past Medical History:  Diagnosis Date  . Anemia   . CVA (cerebral infarction)   . Diabetes mellitus without complication ([name])   . Diabetic retinopathy ([name])   . Dialysis patient ([name])   . GERD (gastroesophageal reflux disease)   . Hyperlipidemia   . Hypertension   . Kidney disease   . 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 Pancreatitis   . Stroke [facility]([name])    Past Surgical History:  Past Surgical History:  Procedure Laterality Date  . fistula placement left upper arm    . PERIPHERAL VASCULAR CATHETERIZATION Right 07/20/2014   Procedure: A/V Shuntogram/Fistulagram;  Surgeon: Annice NeedyJason S Dew, MD;  Location: [facility] INVASIVE CV LAB;  Service: Cardiovascular;  Laterality: Right;  . PERIPHERAL VASCULAR CATHETERIZATION N/A 07/20/2014   Procedure: A/V Shunt Intervention;  Surgeon: Annice NeedyJason S Dew, MD;  Location: [facility] INVASIVE CV LAB;  Service: Cardiovascular;  Laterality: N/A;  . PERIPHERAL VASCULAR CATHETERIZATION Left 07/12/2015   Procedure: Lower Extremity Angiography;  Surgeon: Annice NeedyJason S Dew, MD;  Location: [facility] INVASIVE CV LAB;  Service: Cardiovascular;  Laterality: Left;   HPI:  Pt is a 50 y/o male with multiple medical issues including CVAs, HTN, DM and a known history of end-stage renal disease on T-Th-S dialysis, neuropathy, stroke, peripheral vascular disease, diabetic retinopathy, hypertension, diabetes mellitus presents from group home secondary to worsening left great toe gangrene. Pt has been feeling weaker and missed dialysis on day of admission. Pt's speech is slower and overall bolus management during oral intake is slower since previous CVA per Caregiver.   Assessment / Plan / Recommendation Clinical  Impression  Pt appears at reduced risk for aspiration w/ thin liquids and soft solids(broken down and moistened well) when assisted w/ feeding and monitoring for following general aspiration precautions.  Pt exhibited min slower oral manipulation of bolus material especially as texture increased but given min extra time, he swallowed and cleared appropriately. Pt was able to masticate the softened foods - Caregiver stated softer foods were offered to him at the Group Home. No overt s/s of aspiration were noted w/ the trials of thin liquids or solids. Of note, pt did exhibit min increased effort for organizing/coordinating and preparing for the swallow - Caregiver stated this presentation is baseline for pt at the Group Home as well. It appeared this presentation has been ongoing for some time per discussion w/ Daughter and Caregiver. Suspect this could be related to pt's baseline Neurological status/disease. Pt would benefit from a softened foods diet moistened well; general aspiration precautions. ST will monitor pt's status for any further education on precautions and support while admitted. NSG updated. Caregiver and pt agreed.    Aspiration Risk   (reduced)    Diet Recommendation  Dysphagia 3(mech soft w/ well chopped meats and gravy added to moisten well), thin liquids; aspiration precautions; feeding assistance; reflux precautions  Medication Administration: Crushed with puree (this is a baseline need for pt per Caregiver/Dtr)    Other  Recommendations Recommended Consults:  (dietician) Oral Care Recommendations: Oral care BID;Staff/trained caregiver to provide oral care   Follow up Recommendations  None (TBD)    Frequency and Duration min 2x/week  1 week       Prognosis Prognosis  for Safe Diet Advancement: Fair (-Good) Barriers to Reach Goals: Cognitive deficits      Swallow Study   General Date of Onset: 08/17/15 HPI: Pt is a 50 y/o male with multiple medical issues including  CVAs, HTN, DM and a known history of end-stage renal disease on T-Th-S dialysis, neuropathy, stroke, peripheral vascular disease, diabetic retinopathy, hypertension, diabetes mellitus presents from group home secondary to worsening left great toe gangrene. Pt has been feeling weaker and missed dialysis on day of admission. Pt's speech is slower and overall bolus management during oral intake is slower since previous CVA per Caregiver. Type of Study: Bedside Swallow Evaluation Previous Swallow Assessment: at time of previous CVA per report Diet Prior to this Study: Thin liquids;Dysphagia 3 (soft) Temperature Spikes Noted: No (wbc elevated) Respiratory Status: Room air History of Recent Intubation: No Behavior/Cognition: Alert;Cooperative;Pleasant mood;Confused;Requires cueing (baseline per Caregiver) Oral Cavity Assessment: Dry Oral Care Completed by SLP: Recent completion by staff Oral Cavity - Dentition: Poor condition;Missing dentition Vision:  (n/a) Self-Feeding Abilities: Needs set up;Needs assist;Total assist Patient Positioning: Upright in bed Baseline Vocal Quality: Low vocal intensity (mumbled speech) Volitional Cough: Cognitively unable to elicit Volitional Swallow: Unable to elicit    Oral/Motor/Sensory Function Overall Oral Motor/Sensory Function:  (grossly wfl during bolus management)   Ice Chips Ice chips: Not tested   Thin Liquid Thin Liquid: Impaired Presentation: Straw (fed; 6 trials) Oral Phase Impairments: Reduced lingual movement/coordination (slower management) Oral Phase Functional Implications: Prolonged oral transit Pharyngeal  Phase Impairments:  (none) Other Comments: baseline    Nectar Thick Nectar Thick Liquid: Not tested   Honey Thick Honey Thick Liquid: Not tested   Puree Puree: Impaired Presentation: Spoon (fed; 5 trials) Oral Phase Impairments: Reduced lingual movement/coordination (slower management) Oral Phase Functional Implications: Prolonged oral  transit Pharyngeal Phase Impairments:  (none) Other Comments: baseline   Solid   GO   Solid: Impaired (broken down and moistened) Presentation: Spoon (fed; 3 trials) Oral Phase Impairments: Reduced lingual movement/coordination (slower management) Oral Phase Functional Implications: Prolonged oral transit Pharyngeal Phase Impairments:  (none) Other Comments: baseline    Functional Assessment Tool Used: clincial judgement Functional Limitations: Swallowing Swallow Current Status ([id number]): At least 20 percent but less than 40 percent impaired, limited or restricted Swallow Goal Status [phone]): At least 20 percent but less than 40 percent impaired, limited or restricted Swallow Discharge Status [phone](G8998): At least 20 percent but less than 40 percent impaired, limited or restricted    Jerilynn SomKatherine Watson, MS, CCC-SLP  Watson,Katherine 08/18/2015,3:48 PM

## 2015-08-18 NOTE — Progress Notes (Signed)
Per Dr. Juliene PinaMody okay to place order for speech evaluation as pt does not swallow meds well

## 2015-08-18 NOTE — Progress Notes (Signed)
PT Cancellation Note  Patient Details Name: Devin Richmond MRN: [id number] DOB: April 27, 1965   Cancelled Treatment:    Reason Eval/Treat Not Completed: Patient not medically ready (Consult received and chart reviewed.  Per podiatry notes, patient likely to need some level of amputation; pending vascular consult.  Will hold mobility assessment until plan established.  Anticipate need for new orders post-op.  Will follow and initiate as appropriate.)   Sharyah Bostwick H. Manson PasseyBrown, PT, DPT, NCS 08/18/15, 9:59 AM [phone]

## 2015-08-18 NOTE — Progress Notes (Signed)
Inpatient Diabetes Program Recommendations  AACE/ADA: New Consensus Statement on Inpatient Glycemic Control (2015)  Target Ranges:  Prepandial:   less than 140 mg/dL      Peak postprandial:   less than 180 mg/dL (1-2 hours)      Critically ill patients:  140 - 180 mg/dL   Results for Devin CallasSMITH, [name] (MRN [id number]) as of 08/18/2015 08:10  Ref. Range 08/17/2015 16:34 08/17/2015 20:01 08/18/2015 00:50 08/18/2015 08:11  Glucose-Capillary Latest Ref Range: 65 - 99 mg/dL 409335 (H) 811383 (H) 914219 (H) 246 (H)   Review of Glycemic Control  Diabetes history: DM2 Outpatient Diabetes medications: Levemir 10 units QHS, Novolog 2-10 units TID with meals Current orders for Inpatient glycemic control: Levemir 10 units QHS, Novolog 0-15 units TID with meals, Novolog 0-5 units QHS  Inpatient Diabetes Program Recommendations: Insulin - Basal: Please consider increasing Levemir to 15 units QHS. Insulin - Meal Coverage: Please consider ordering 3 units TID with meals for meal coverage if patient eats at lest 50% of meal. HgbA1C: A1C in process.  NOTE: Consult for Diabetes Coordinator noted and chart reviewed.   Thanks, Orlando PennerMarie Moya Duan, RN, MSN, CDE Diabetes Coordinator Inpatient Diabetes Program [phone] (Team Pager from 8am to 5pm) [phone] ([facility] office) [phone] [facility] office) [phone] [facility] office)

## 2015-08-18 NOTE — Consult Note (Signed)
Reason for Consult: Gangrene left great toe Referring Physician: Hospitalist's  Devin Richmond is an 50 y.o. male.  HPI: History of present illness cannot really be obtained by the patient today. He was seen in the office as an outpatient last month where some gangrenous changes were noted on the left great toe. He was referred for vascular evaluation and did have an interventional procedure. Apparently the toe has continued to progress with his gangrene and was admitted yesterday for treatment.  Past Medical History:  Diagnosis Date  . Anemia   . CVA (cerebral infarction)   . Diabetes mellitus without complication ([place])   . Diabetic retinopathy ([place])   . Dialysis patient ([place])   . GERD (gastroesophageal reflux disease)   . Hyperlipidemia   . Hypertension   . Kidney disease   . 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 Pancreatitis   . Stroke [facility])     Past Surgical History:  Procedure Laterality Date  . fistula placement left upper arm    . PERIPHERAL VASCULAR CATHETERIZATION Right 07/20/2014   Procedure: A/V Shuntogram/Fistulagram;  Surgeon: Algernon Huxley, MD;  Location: [facility];  Service: Cardiovascular;  Laterality: Right;  . PERIPHERAL VASCULAR CATHETERIZATION N/A 07/20/2014   Procedure: A/V Shunt Intervention;  Surgeon: Algernon Huxley, MD;  Location: [place] [facility];  Service: Cardiovascular;  Laterality: N/A;  . PERIPHERAL VASCULAR CATHETERIZATION Left 07/12/2015   Procedure: Lower Extremity Angiography;  Surgeon: Algernon Huxley, MD;  Location: [place] [facility];  Service: Cardiovascular;  Laterality: Left;    Family History  Problem Relation Age of Onset  . Stroke Father     Social History:  reports that he has never smoked. He has never used smokeless tobacco. He reports that he does not drink alcohol or use drugs.  Allergies:  Allergies  Allergen Reactions  . Sulfa Antibiotics Other (See Comments)    unknown    Medications:  Scheduled: .  atorvastatin  80 mg Oral QHS  . atropine  1 drop Left Eye BID  . carvedilol  12.5 mg Oral BID WC  . cinacalcet  90 mg Oral Q breakfast  . famotidine  20 mg Oral Daily  . folic acid  923 mcg Oral Daily  . heparin  5,000 Units Subcutaneous Q8H  . insulin aspart  0-15 Units Subcutaneous TID WC  . insulin aspart  0-5 Units Subcutaneous QHS  . insulin detemir  10 Units Subcutaneous QHS  . lanthanum  1,000 mg Oral TID WC  . loratadine  10 mg Oral Daily  . meclizine  25 mg Oral BID  . mirtazapine  30 mg Oral QHS  . piperacillin-tazobactam (ZOSYN)  IV  3.375 g Intravenous Q12H  . sevelamer carbonate  800 mg Oral TID WC  . vancomycin  500 mg Intravenous Once  . [START ON 08/19/2015] vancomycin  1,000 mg Intravenous Q T,Th,Sa-HD    Results for orders placed or performed during the hospital encounter of 08/17/15 (from the past 48 hour(s))  Glucose, capillary     Status: Abnormal   Collection Time: 08/17/15  4:34 PM  Result Value Ref Range   Glucose-Capillary 335 (H) 65 - 99 mg/dL  Basic metabolic panel     Status: Abnormal   Collection Time: 08/17/15  6:04 PM  Result Value Ref Range   Sodium 136 135 - 145 mmol/L   Potassium 5.8 (H) 3.5 - 5.1 mmol/L   Chloride 94 (L) 101 - 111 mmol/L  CO2 25 22 - 32 mmol/L   Glucose, Bld 316 (H) 65 - 99 mg/dL   BUN 89 (H) 6 - 20 mg/dL    Comment: RESULT CONFIRMED BY MANUAL DILUTION JUW    Creatinine, Ser 18.25 (H) 0.61 - 1.24 mg/dL   Calcium 7.9 (L) 8.9 - 10.3 mg/dL   GFR calc non Af Amer 3 (L) >60 mL/min   GFR calc Af Amer 3 (L) >60 mL/min    Comment: (NOTE) The eGFR has been calculated using the CKD EPI equation. This calculation has not been validated in all clinical situations. eGFR's persistently <60 mL/min signify possible Chronic Kidney Disease.    Anion gap 17 (H) 5 - 15  CBC     Status: Abnormal   Collection Time: 08/17/15  6:04 PM  Result Value Ref Range   WBC 21.6 (H) 3.8 - 10.6 K/uL   RBC 3.85 (L) 4.40 - 5.90 MIL/uL   Hemoglobin  12.1 (L) 13.0 - 18.0 g/dL   HCT 37.1 (L) 40.0 - 52.0 %   MCV 96.2 80.0 - 100.0 fL   MCH 31.5 26.0 - 34.0 pg   MCHC 32.7 32.0 - 36.0 g/dL   RDW 16.3 (H) 11.5 - 14.5 %   Platelets 227 150 - 440 K/uL  Beta-hydroxybutyric acid     Status: Abnormal   Collection Time: 08/17/15  6:04 PM  Result Value Ref Range   Beta-Hydroxybutyric Acid 0.40 (H) 0.05 - 0.27 mmol/L  Glucose, capillary     Status: Abnormal   Collection Time: 08/17/15  8:01 PM  Result Value Ref Range   Glucose-Capillary 383 (H) 65 - 99 mg/dL  Glucose, capillary     Status: Abnormal   Collection Time: 08/18/15 12:50 AM  Result Value Ref Range   Glucose-Capillary 219 (H) 65 - 99 mg/dL   Comment 1 Notify RN   MRSA PCR Screening     Status: None   Collection Time: 08/18/15  3:00 AM  Result Value Ref Range   MRSA by PCR NEGATIVE NEGATIVE    Comment:        The GeneXpert MRSA Assay (FDA approved for NASAL specimens only), is one component of a comprehensive MRSA colonization surveillance program. It is not intended to diagnose MRSA infection nor to guide or monitor treatment for MRSA infections.   Basic metabolic panel     Status: Abnormal   Collection Time: 08/18/15  4:41 AM  Result Value Ref Range   Sodium 139 135 - 145 mmol/L   Potassium 5.0 3.5 - 5.1 mmol/L   Chloride 94 (L) 101 - 111 mmol/L   CO2 31 22 - 32 mmol/L   Glucose, Bld 256 (H) 65 - 99 mg/dL   BUN 71 (H) 6 - 20 mg/dL   Creatinine, Ser 13.51 (H) 0.61 - 1.24 mg/dL   Calcium 8.0 (L) 8.9 - 10.3 mg/dL   GFR calc non Af Amer 4 (L) >60 mL/min   GFR calc Af Amer 4 (L) >60 mL/min    Comment: (NOTE) The eGFR has been calculated using the CKD EPI equation. This calculation has not been validated in all clinical situations. eGFR's persistently <60 mL/min signify possible Chronic Kidney Disease.    Anion gap 14 5 - 15  CBC     Status: Abnormal   Collection Time: 08/18/15  4:41 AM  Result Value Ref Range   WBC 22.6 (H) 3.8 - 10.6 K/uL   RBC 3.73 (L) 4.40 -  5.90 MIL/uL   Hemoglobin 11.7 (  L) 13.0 - 18.0 g/dL   HCT 35.6 (L) 40.0 - 52.0 %   MCV 95.3 80.0 - 100.0 fL   MCH 31.2 26.0 - 34.0 pg   MCHC 32.8 32.0 - 36.0 g/dL   RDW 17.0 (H) 11.5 - 14.5 %   Platelets 239 150 - 440 K/uL  Glucose, capillary     Status: Abnormal   Collection Time: 08/18/15  8:11 AM  Result Value Ref Range   Glucose-Capillary 246 (H) 65 - 99 mg/dL    Dg Chest Portable 1 View  Result Date: 08/17/2015 CLINICAL DATA:  Cough.  Weakness since Sunday. EXAM: PORTABLE CHEST 1 VIEW COMPARISON:  06/24/2015 FINDINGS: Midline trachea.  Normal heart size and mediastinal contours. Sharp costophrenic angles.  No pneumothorax.  Clear lungs. Patient rotated minimally right. IMPRESSION: No active disease. Electronically Signed   By: Abigail Miyamoto M.D.   On: 08/17/2015 19:15   Dg Foot Complete Left  Result Date: 08/17/2015 CLINICAL DATA:  Ischemic great toe. Left great toe wound for 2 months. EXAM: LEFT FOOT - COMPLETE 3+ VIEW COMPARISON:  None. FINDINGS: There is soft tissue ulceration involving the great toe at the level of the distal phalanx with heterogeneous lucency compatible with soft tissue emphysema. No definite underlying osseous erosion is identified to indicate osteomyelitis. No acute fracture or dislocation is identified. There are dorsiflexion deformities at the second through fifth MCP joints. Diffuse atherosclerotic vascular calcification is present. There is diffuse soft tissue swelling about the forefoot. IMPRESSION: Soft tissue ulceration and soft tissue emphysema involving the left great toe. No definite radiographic evidence of osteomyelitis. Electronically Signed   By: Logan Bores M.D.   On: 08/17/2015 17:57    Review of Systems  Unable to perform ROS: Mental acuity (Unresponsive to questions)   Blood pressure 120/84, pulse 90, temperature 98.2 F (36.8 C), temperature source Oral, resp. rate 20, height 6' (1.829 m), weight 86.1 kg (189 lb 13.1 oz), SpO2 98 %. Physical  Exam  Cardiovascular:  DP pulse is thready bilateral. PT pulse is thready and not clearly palpable.  Musculoskeletal:  Stiff range of motion in the pedal joints. Muscle testing is deferred.  Neurological:  Loss of protective threshold monofilament wire distally in the feet and toes. Proprioception is impaired  Skin:  Skin is then dry and atrophic. Some discolorations in the left great toe with dry gangrenous changes at the distal aspect of the left hallux. No significant drainage is noted.    Assessment/Plan: Assessment: 1. Peripheral vascular disease with gangrene left great toe. 2. Diabetes with associated neuropathy.   Plan: Patient will need some level of amputation. We will wait to see what vascular surgery's recommendations are as far as his healing potential and level of amputation. Attempted to discuss this with the patient but he did not really seem to have any clear understanding. We will follow the patient accordingly.  Durward Fortes 08/18/2015, 8:20 AM

## 2015-08-19 ENCOUNTER — Encounter
Admission: EM | Disposition: A | Discharge status: Skilled Nursing Facility | Payer: Self-pay | Source: Home / Self Care | Attending: Internal Medicine

## 2015-08-19 HISTORY — PX: PERIPHERAL VASCULAR CATHETERIZATION: SHX172C

## 2015-08-19 LAB — GLUCOSE, CAPILLARY
Glucose-Capillary: 80 mg/dL (ref 65–99)
Glucose-Capillary: 75 mg/dL (ref 65–99)
Glucose-Capillary: 123 mg/dL — ABNORMAL HIGH (ref 65–99)
Glucose-Capillary: 94 mg/dL (ref 65–99)

## 2015-08-19 LAB — HEPATITIS B SURFACE ANTIGEN: HEP B S AG: NEGATIVE

## 2015-08-19 LAB — CBC
HEMATOCRIT: 35.3 % — AB (ref 40.0–52.0)
Hemoglobin: 11.7 g/dL — ABNORMAL LOW (ref 13.0–18.0)
MCH: 31.6 pg (ref 26.0–34.0)
MCHC: 33.2 g/dL (ref 32.0–36.0)
MCV: 95.1 fL (ref 80.0–100.0)
Platelets: 239 10*3/uL (ref 150–440)
RBC: 3.71 MIL/uL — AB (ref 4.40–5.90)
RDW: 16.4 % — ABNORMAL HIGH (ref 11.5–14.5)
WBC: 21.5 10*3/uL — AB (ref 3.8–10.6)

## 2015-08-19 LAB — HEPATITIS B SURFACE ANTIBODY,QUALITATIVE: HEP B S AB: REACTIVE

## 2015-08-19 SURGERY — LOWER EXTREMITY ANGIOGRAPHY
Anesthesia: Moderate Sedation | Laterality: Left

## 2015-08-19 MED ORDER — HEPARIN SODIUM (PORCINE) 1000 UNIT/ML IJ SOLN
INTRAMUSCULAR | Status: AC
  Filled 2015-08-19: qty 1

## 2015-08-19 MED ORDER — IOPAMIDOL (ISOVUE-300) INJECTION 61%
INTRAVENOUS | Status: DC | PRN
  Administered 2015-08-19: 80 mL via INTRAVENOUS

## 2015-08-19 MED ORDER — CHLORPROMAZINE HCL 25 MG/ML IJ SOLN
25.0000 mg | Freq: Once | INTRAMUSCULAR | Status: AC
  Administered 2015-08-19: 25 mg via INTRAMUSCULAR
  Filled 2015-08-19: qty 1

## 2015-08-19 MED ORDER — FENTANYL CITRATE (PF) 100 MCG/2ML IJ SOLN
INTRAMUSCULAR | Status: AC
Start: 2015-08-19 — End: 2015-08-19
  Filled 2015-08-19: qty 2

## 2015-08-19 MED ORDER — MIDAZOLAM HCL 2 MG/2ML IJ SOLN
INTRAMUSCULAR | Status: DC | PRN
  Administered 2015-08-19: 1 mg via INTRAVENOUS
  Administered 2015-08-19: 2 mg via INTRAVENOUS

## 2015-08-19 MED ORDER — SODIUM CHLORIDE 0.9 % IV SOLN
INTRAVENOUS | Status: DC | PRN
  Administered 2015-08-19: 250 mL via INTRAVENOUS

## 2015-08-19 MED ORDER — HEPARIN (PORCINE) IN NACL 2-0.9 UNIT/ML-% IJ SOLN
INTRAMUSCULAR | Status: AC
  Filled 2015-08-19: qty 1000

## 2015-08-19 MED ORDER — LIDOCAINE-EPINEPHRINE (PF) 1 %-1:200000 IJ SOLN
INTRAMUSCULAR | Status: AC
  Filled 2015-08-19: qty 30

## 2015-08-19 MED ORDER — DEXTROSE 5 % IV SOLN
1.5000 g | Freq: Once | INTRAVENOUS | Status: AC
  Administered 2015-08-19: 1.5 g via INTRAVENOUS

## 2015-08-19 MED ORDER — SODIUM CHLORIDE 0.9 % IV SOLN
INTRAVENOUS | Status: DC
Start: 2015-08-19 — End: 2015-08-19
  Administered 2015-08-19: 04:00:00 via INTRAVENOUS

## 2015-08-19 MED ORDER — HEPARIN SODIUM (PORCINE) 1000 UNIT/ML IJ SOLN
INTRAMUSCULAR | Status: DC | PRN
  Administered 2015-08-19: 4000 [IU] via INTRAVENOUS

## 2015-08-19 MED ORDER — MIDAZOLAM HCL 5 MG/5ML IJ SOLN
INTRAMUSCULAR | Status: AC
  Filled 2015-08-19: qty 5

## 2015-08-19 MED ORDER — FENTANYL CITRATE (PF) 100 MCG/2ML IJ SOLN
INTRAMUSCULAR | Status: DC | PRN
  Administered 2015-08-19 (×2): 50 ug via INTRAVENOUS

## 2015-08-19 SURGICAL SUPPLY — 19 items
BALLN LUTONIX 4X150X130 (BALLOONS) ×4
BALLN ULTRVRSE 2X150X150 (BALLOONS) ×4
BALLN ULTRVRSE 2X150X150 OTW (BALLOONS) ×2
BALLN ULTRVRSE 3.5X100X150 (BALLOONS) ×4
BALLOON LUTONIX 4X150X130 (BALLOONS) IMPLANT
BALLOON ULTRVRSE 2X150X150 OTW (BALLOONS) IMPLANT
BALLOON ULTRVRSE 3.5X100X150 (BALLOONS) IMPLANT
CATH CXI SUPP ST 4FR 135CM (MICROCATHETER) ×2 IMPLANT
CATH PIG 70CM (CATHETERS) ×4 IMPLANT
DEVICE PRESTO INFLATION (MISCELLANEOUS) ×2 IMPLANT
DEVICE STARCLOSE SE CLOSURE (Vascular Products) ×2 IMPLANT
GLIDEWIRE ADV .035X260CM (WIRE) ×2 IMPLANT
PACK ANGIOGRAPHY (CUSTOM PROCEDURE TRAY) ×4 IMPLANT
SHEATH ANL2 6FRX45 HC (SHEATH) ×2 IMPLANT
SHEATH BRITE TIP 5FRX11 (SHEATH) ×4 IMPLANT
SYR MEDRAD MARK V 150ML (SYRINGE) ×4 IMPLANT
TUBING CONTRAST HIGH PRESS 72 (TUBING) ×4 IMPLANT
WIRE G V18X300CM (WIRE) ×2 IMPLANT
WIRE J 3MM .035X145CM (WIRE) ×4 IMPLANT

## 2015-08-19 NOTE — Progress Notes (Signed)
POST HD ASSESSMENT 

## 2015-08-19 NOTE — Progress Notes (Signed)
POST HD VITALS 

## 2015-08-19 NOTE — Progress Notes (Signed)
[facility] [place] [facility]   PATIENT NAME: Devin Richmond    MR#:  [id number]  DATE OF BIRTH:  1965-06-13  SUBJECTIVE:   patient seen after angiogram  Denies pain   REVIEW OF SYSTEMS:    Review of Systems  Constitutional: Negative.  Negative for chills, fever and malaise/fatigue.  HENT: Negative.  Negative for ear discharge, ear pain, hearing loss, nosebleeds and sore throat.   Eyes: Negative.  Negative for blurred vision and pain.  Respiratory: Negative.  Negative for cough, hemoptysis, shortness of breath and wheezing.   Cardiovascular: Negative.  Negative for chest pain, palpitations and leg swelling.  Gastrointestinal: Negative.  Negative for abdominal pain, blood in stool, diarrhea, nausea and vomiting.  Genitourinary: Negative.  Negative for dysuria.  Musculoskeletal: Negative.  Negative for back pain.  Skin: Negative.        Left big toe gangrenous malodorous  Neurological: Negative for dizziness, tremors, speech change, focal weakness, seizures and headaches.       Hx cva  Slow when he speaks  Endo/Heme/Allergies: Negative.  Does not bruise/bleed easily.  Psychiatric/Behavioral: Negative.  Negative for depression, hallucinations and suicidal ideas.    Tolerating Diet:yes      DRUG ALLERGIES:   Allergies  Allergen Reactions  . Sulfa Antibiotics Other (See Comments)    unknown    VITALS:  Blood pressure 122/71, pulse (!) 103, temperature 98.2 F (36.8 C), temperature source Oral, resp. rate 14, height 6' (1.829 m), weight 86.1 kg (189 lb 13.1 oz), SpO2 94 %.  PHYSICAL EXAMINATION:   Physical Exam  Constitutional: He is oriented to person, place, and time and well-developed, well-nourished, and in no distress. No distress.  HENT:  Head: Normocephalic.  Eyes: No scleral icterus.  Neck: Normal range of motion. Neck supple. No JVD present. No tracheal deviation present.  Cardiovascular: Normal rate, regular rhythm and normal heart  sounds.  Exam reveals no gallop and no friction rub.   No murmur heard. Pulmonary/Chest: Effort normal and breath sounds normal. No respiratory distress. He has no wheezes. He has no rales. He exhibits no tenderness.  Abdominal: Soft. Bowel sounds are normal. He exhibits no distension and no mass. There is no tenderness. There is no rebound and no guarding.  Musculoskeletal: Normal range of motion. He exhibits no edema.  Neurological: He is alert and oriented to person, place, and time.  Slow to speak  Skin: Skin is warm. No rash noted. No erythema.  Big toe foul smelling Necrotic toe, purulent discharge  Psychiatric: Affect and judgment normal.      LABORATORY PANEL:   CBC  Recent Labs Lab 08/19/15 0441  WBC 21.5*  HGB 11.7*  HCT 35.3*  PLT 239   ------------------------------------------------------------------------------------------------------------------  Chemistries   Recent Labs Lab 08/18/15 0441  NA 139  K 5.0  CL 94*  CO2 31  GLUCOSE 256*  BUN 71*  CREATININE 13.51*  CALCIUM 8.0*   ------------------------------------------------------------------------------------------------------------------  Cardiac Enzymes No results for input(s): TROPONINI in the last 168 hours. ------------------------------------------------------------------------------------------------------------------  RADIOLOGY:  Dg Chest Portable 1 View  Result Date: 08/17/2015 CLINICAL DATA:  Cough.  Weakness since Sunday. EXAM: PORTABLE CHEST 1 VIEW COMPARISON:  06/24/2015 FINDINGS: Midline trachea.  Normal heart size and mediastinal contours. Sharp costophrenic angles.  No pneumothorax.  Clear lungs. Patient rotated minimally right. IMPRESSION: No active disease. Electronically Signed   By: Jeronimo GreavesKyle  Talbot M.D.   On: 08/17/2015 19:15   Dg Foot Complete Left  Result Date: 08/17/2015  CLINICAL DATA:  Ischemic great toe. Left great toe wound for 2 months. EXAM: LEFT FOOT - COMPLETE 3+ VIEW  COMPARISON:  None. FINDINGS: There is soft tissue ulceration involving the great toe at the level of the distal phalanx with heterogeneous lucency compatible with soft tissue emphysema. No definite underlying osseous erosion is identified to indicate osteomyelitis. No acute fracture or dislocation is identified. There are dorsiflexion deformities at the second through fifth MCP joints. Diffuse atherosclerotic vascular calcification is present. There is diffuse soft tissue swelling about the forefoot. IMPRESSION: Soft tissue ulceration and soft tissue emphysema involving the left great toe. No definite radiographic evidence of osteomyelitis. Electronically Signed   By: Sebastian AcheAllen  Grady M.D.   On: 08/17/2015 17:57     ASSESSMENT AND PLAN:   Devin Richmond  is a 50 y.o. male with a known history of End-stage renal disease on T-Th-S dialysis, neuropathy, stroke, peripheral vascular disease, diabetic retinopathy, hypertension, diabetes mellitus presents from group home secondary to worsening left great toe gangrene.                   1 Sepsis due to left great toe gangrene with probable underlying osteomyelitis/PAD He presented with leukocytosis and hypotension.  Patient has been evaluated by podiatry and will need amputation at some level. He underwent angiogram today with Percutaneous transluminal angioplasty of distal posterior tibial artery andmore proximal posterior tibial artery, percutaneous transluminal angioplasty of the tibioperoneal trunk and origin of the posterior tibial artery and percutaneous transluminal angioplasty of the peroneal artery  He is currently on IV Unasyn as per ID consult. MRSA screen was negative. Appreciate ID, podiatry, wound care and vascular surgery consult. Blood cultures are negative today.   Cleanse left great toe with NS.  Apply dry dressing and secure with tape. Change daily  2 hyperkalemia: resolved after HD   3 ESRD on hemodialysis: Appreciate Nephrology  consult. Patient is scheduled for dialysis today.  4 diabetes mellitus uncontrolled with DM retinopathy: Evaluated by diabetes coordinator today. Continue Levemir and pre-meal insulin with sliding scale insulin and ADA diet.  5. History of CVA: Continue statin however Plavix is on hold due to need for surgery.  Speech consult for swallow evaluation Pt will safely tolerate po diet of least restrictive consistency w/ no overt s/s of aspiration     Management plans discussed with the patient and he is in agreement.   D/w Dr dew this am   CODE STATUS: full  TOTAL TIME TAKING CARE OF THIS PATIENT: 22 minutes.     POSSIBLE D/C 2 days DEPENDING ON CLINICAL CONDITION.   Dvaughn Fickle M.D on 08/19/2015 at 12:43 PM  Between 7am to 6pm - Pager - [phone] After 6pm go to [email or web address] - password [email or web address]  [facility]  Office  [phone]  CC: Primary care physician; Leotis ShamesSingh,Jasmine, MD  Note: This dictation was prepared with Dragon dictation along with smaller phrase technology. Any transcriptional errors that result from this process are unintentional.

## 2015-08-19 NOTE — Plan of Care (Signed)
Problem: SLP Dysphagia Goals Goal: Misc Dysphagia Goal Pt will safely tolerate po diet of least restrictive consistency w/ no overt s/s of aspiration noted by Staff/pt/family x3 sessions.    

## 2015-08-19 NOTE — Progress Notes (Signed)
PRE HD ASSESSMENT 

## 2015-08-19 NOTE — Op Note (Signed)
[facility] Percutaneous Study/Intervention Procedural Note   Date of Surgery:  08/19/2015  Surgeon(s):Tevita Gomer   Assistants:none  Pre-operative Diagnosis: PAD with gangrene left great toe  Post-operative diagnosis: Same  Procedure(s) Performed: 1. Ultrasound guidance for vascular access right femoral artery 2. Catheter placement into left PT artery and peroneal artery from right femoral approach 3. Aortogram and selective left lower extremity angiogram 4. Percutaneous transluminal angioplasty of distal posterior tibial artery with 2 mm diameter angioplasty balloon and more proximal posterior tibial artery with 3.5 mm diameter angioplasty balloon 5. Percutaneous transluminal angioplasty of the tibioperoneal trunk and origin of the posterior tibial artery with 4 mm diameter Lutonix drug-coated angioplasty balloon  6.  Percutaneous transluminal angioplasty of the peroneal artery with 3.5 mm diameter angioplasty balloon 7. StarClose closure device right femoral artery  EBL: 10 cc  Contrast: 80 cc  Fluoro Time: 7.3 minutes  Moderate Conscious Sedation Time: approximately 45 minutes using 3 mg of Versed and 100 mcg of Fentanyl  Indications: Patient is a 50 y.o.male with worsening gangrenous changes of the left great toe and now with purulent drainage. The patient has had previous intervention, and is brought back for repeat angiography secondary to the lack of noninvasive studies at our hospital that would be appropriate. The patient is brought in for angiography for further evaluation and potential treatment. Risks and benefits are discussed and informed consent is obtained  Procedure: The patient was identified and appropriate procedural time out was performed. The patient was then placed supine on the table and prepped and draped in the usual sterile  fashion.Moderate conscious sedation was administered during a face to face encounter with the patient throughout the procedure with my supervision of the RN administering medicines and monitoring the patient's vital signs, pulse oximetry, telemetry and mental status throughout from the start of the procedure until the patient was taken to the recovery room. Ultrasound was used to evaluate theright femoral artery. It was patent . A digital ultrasound image was acquired. A Seldinger needle was used to access the  right common femoral artery under direct ultrasound guidance and a permanent image was performed. A 0.035 J wire was advanced without resistance and a 5Fr sheath was placed. Pigtail catheter was placed into the aorta and an AP aortogram was performed. This demonstrated normal renal arteries although the flow was somewhat sluggish with his renal failure and normal aorta and iliac segments without significant stenosis. I then crossed the aortic bifurcation and advanced to the left femoral head. Selectiveleft lower extremity angiogram was then performed. This demonstrated the previous SFA intervention was patent with no flow limiting stenosis within the common femoral artery, superficial femoral artery, or popliteal artery. The tibioperoneal trunk was very irregular and had a moderate to high-grade stenosis with near occlusive stenosis of the proximal posterior tibial artery and moderate to high-grade stenosis in the 70-80% range in the proximal peroneal artery. The distal posterior tibial artery in the foot was diseased but was continuous to the ankle. The patient was systemically heparinized and a 6 [place] Ansell sheath was then placed over the [employer] wire. I then used a Kumpe catheter and the advantage wire navigate across the tibioperoneal trunk and get into the posterior tibial artery. A CXI catheter was then placed and intraluminal flow was confirmed in the mid posterior tibial artery. We  placed an 018 wire into the foot. Angioplasty was performed with a 2 mm diameter by 15 cm length angioplasty balloon inflated from the  ankle up to the distal posterior tibial artery. This was inflated to 10 atm for 1 minute. In the tibioperoneal trunk a 4 mm diameter by 15 cm length Lutonix drug-coated angioplasty balloon was inflated up to about 8 atm for 1 minute. The distal extent of this balloon was in the first centimeter or 2 of the posterior tibial artery. To treat the lesion in the posterior tibial artery 3.5 mm diameter by 10 cm balloon was inflated twice in the proximal and then mid posterior tibial artery. The inflations were 10-12 atm and for 1 minute. This now appeared to improve the perfusion to the posterior tibial artery. There was about a 40-45% residual stenosis in the proximal posterior tibial artery and less than 20% stenosis in the mid to distal posterior tibial artery. The tibioperoneal trunk had about a 30% residual stenosis. I elected to intervene on the peroneal artery to try to improve his distal perfusion with a gangrenous changes. I redirected the CXI catheter and the advantage wire and was able to cross the stenosis in the peroneal artery and confirm intraluminal flow in the mid peroneal artery with the CXI catheter. A 3.5 mm diameter by 10 cm balloon was then inflated to 10 atm in the proximal peroneal artery. The balloon was deflated and approximately 20-25% residual stenosis was all that remained. I elected to terminate the procedure. The sheath was removed and StarClose closure device was deployed in the right femoral artery with excellent hemostatic result. The patient was taken to the recovery room in stable condition having tolerated the procedure well.  Findings:  Aortogram: This demonstrated normal renal arteries although the flow was somewhat sluggish with his renal failure and normal aorta and iliac segments without significant stenosis. Right  Lower Extremity: This demonstrated the previous SFA intervention was patent with no flow limiting stenosis within the common femoral artery, superficial femoral artery, or popliteal artery. The tibioperoneal trunk was very irregular and had a moderate to high-grade stenosis with near occlusive stenosis of the proximal posterior tibial artery and moderate to high-grade stenosis in the 70-80% range in the proximal peroneal artery. The distal posterior tibial artery in the foot was diseased but was continuous to the ankle.    Disposition: Patient was taken to the recovery room in stable condition having tolerated the procedure well.  Complications: None  Tanina Barb 08/19/2015 9:01 AM

## 2015-08-19 NOTE — Progress Notes (Signed)
[facility]  ROUNDING NOTE   Subjective:  Patient seen and evaluated during dialysis. Had angiogram this AM.    Objective:  Vital signs in last 24 hours:  Temp:  [98.1 F (36.7 C)-98.7 F (37.1 C)] 98.2 F (36.8 C) (08/17 1030) Pulse Rate:  [91-102] 100 (08/17 1130) Resp:  [0-20] 16 (08/17 1130) BP: (97-145)/(49-94) 117/62 (08/17 1130) SpO2:  [91 %-100 %] 97 % (08/17 1130)  Weight change:  Filed Weights   08/17/15 1627 08/17/15 2050  Weight: 86.2 kg (190 lb) 86.1 kg (189 lb 13.1 oz)    Intake/Output: I/O last 3 completed shifts: In: 514 [P.O.:360; I.V.:20; IV Piggyback:134] Out: 1991 [Other:1991]   Intake/Output this shift:  No intake/output data recorded.  Physical Exam: General: No acute distress  Head: Normocephalic, atraumatic. Moist oral mucosal membranes  Eyes: Anicteric  Neck: Supple, trachea midline  Lungs:  Clear to auscultation, normal effort  Heart: S1S2 no rubs  Abdomen:  Soft, nontender, BS present  Extremities: trace peripheral edema, left foot wrapped  Neurologic: Nonfocal, moving all four extremities  Skin: No lesions  Access: Left upper extremity AV fistula     Basic Metabolic Panel:  Recent Labs Lab 08/17/15 1804 08/18/15 0441  NA 136 139  K 5.8* 5.0  CL 94* 94*  CO2 25 31  GLUCOSE 316* 256*  BUN 89* 71*  CREATININE 18.25* 13.51*  CALCIUM 7.9* 8.0*    Liver Function Tests: No results for input(s): AST, ALT, ALKPHOS, BILITOT, PROT, ALBUMIN in the last 168 hours. No results for input(s): LIPASE, AMYLASE in the last 168 hours. No results for input(s): AMMONIA in the last 168 hours.  CBC:  Recent Labs Lab 08/17/15 1804 08/18/15 0441 08/19/15 0441  WBC 21.6* 22.6* 21.5*  HGB 12.1* 11.7* 11.7*  HCT 37.1* 35.6* 35.3*  MCV 96.2 95.3 95.1  PLT 227 239 239    Cardiac Enzymes: No results for input(s): CKTOTAL, CKMB, CKMBINDEX, TROPONINI in the last 168 hours.  BNP: Invalid input(s): POCBNP  CBG:  Recent  Labs Lab 08/18/15 0811 08/18/15 1148 08/18/15 1644 08/18/15 2127 08/19/15 1020  GLUCAP 246* 210* 220* 175* 80    Microbiology: Results for orders placed or performed during the hospital encounter of 08/17/15  Culture, blood (Routine X 2) w Reflex to ID Panel     Status: None (Preliminary result)   Collection Time: 08/17/15  6:10 PM  Result Value Ref Range Status   Specimen Description BLOOD  Final   Special Requests 4ML  Final   Culture NO GROWTH 2 DAYS  Final   Report Status PENDING  Incomplete  Culture, blood (Routine X 2) w Reflex to ID Panel     Status: None (Preliminary result)   Collection Time: 08/17/15  6:40 PM  Result Value Ref Range Status   Specimen Description BLOOD  Final   Special Requests 4ML  Final   Culture NO GROWTH 2 DAYS  Final   Report Status PENDING  Incomplete  MRSA PCR Screening     Status: None   Collection Time: 08/18/15  3:00 AM  Result Value Ref Range Status   MRSA by PCR NEGATIVE NEGATIVE Final    Comment:        The GeneXpert MRSA Assay (FDA approved for NASAL specimens only), is one component of a comprehensive MRSA colonization surveillance program. It is not intended to diagnose MRSA infection nor to guide or monitor treatment for MRSA infections.     Coagulation Studies: No results for input(s):  LABPROT, INR in the last 72 hours.  Urinalysis: No results for input(s): COLORURINE, LABSPEC, PHURINE, GLUCOSEU, HGBUR, BILIRUBINUR, KETONESUR, PROTEINUR, UROBILINOGEN, NITRITE, LEUKOCYTESUR in the last 72 hours.  Invalid input(s): APPERANCEUR    Imaging: Dg Chest Portable 1 View  Result Date: 08/17/2015 CLINICAL DATA:  Cough.  Weakness since Sunday. EXAM: PORTABLE CHEST 1 VIEW COMPARISON:  06/24/2015 FINDINGS: Midline trachea.  Normal heart size and mediastinal contours. Sharp costophrenic angles.  No pneumothorax.  Clear lungs. Patient rotated minimally right. IMPRESSION: No active disease. Electronically Signed   By: Jeronimo GreavesKyle  Talbot M.D.    On: 08/17/2015 19:15   Dg Foot Complete Left  Result Date: 08/17/2015 CLINICAL DATA:  Ischemic great toe. Left great toe wound for 2 months. EXAM: LEFT FOOT - COMPLETE 3+ VIEW COMPARISON:  None. FINDINGS: There is soft tissue ulceration involving the great toe at the level of the distal phalanx with heterogeneous lucency compatible with soft tissue emphysema. No definite underlying osseous erosion is identified to indicate osteomyelitis. No acute fracture or dislocation is identified. There are dorsiflexion deformities at the second through fifth MCP joints. Diffuse atherosclerotic vascular calcification is present. There is diffuse soft tissue swelling about the forefoot. IMPRESSION: Soft tissue ulceration and soft tissue emphysema involving the left great toe. No definite radiographic evidence of osteomyelitis. Electronically Signed   By: Sebastian AcheAllen  Grady M.D.   On: 08/17/2015 17:57     Medications:     . ampicillin-sulbactam (UNASYN) IV  3 g Intravenous Q24H  . atorvastatin  80 mg Oral QHS  . atropine  1 drop Left Eye BID  . carvedilol  12.5 mg Oral BID WC  . cinacalcet  90 mg Oral Q breakfast  . famotidine  20 mg Oral Daily  . folic acid  500 mcg Oral Daily  . heparin  5,000 Units Subcutaneous Q8H  . insulin aspart  0-15 Units Subcutaneous TID WC  . insulin aspart  0-5 Units Subcutaneous QHS  . insulin aspart  3 Units Subcutaneous TID WC  . insulin detemir  15 Units Subcutaneous QHS  . lanthanum  1,000 mg Oral TID WC  . loratadine  10 mg Oral Daily  . meclizine  25 mg Oral BID  . mirtazapine  30 mg Oral QHS  . sevelamer carbonate  0.8 g Oral TID WC     Assessment/ Plan:  50 y.o. male with past medical history of end-stage renal disease on hemodialysis TTS, diabetes will start 2, hypertension, history of CVA, anemia of chronic kidney disease, secondary hyperparathyroidism, resides at assisted living facility, peripheral vascular disease, necrotic left first toe  [facility]  [place]/TTS  1. End-stage renal disease TTS: Patient seen and evaluated during hemodialysis. Tolerating well. Complete dialysis today.  2. Hyperkalemia. Potassium currently 5.0. Continue to monitor.  3. Secondary hyperparathyroidism.  Check phosphorus today. Continue Renvela, Fosrenol and Sensipar.  4. Anemia of chronic kidney disease. Most recent hemoglobin was 11.7 therefore we have held Epogen.  5.  Gangrene of left first toe/peripheral vascular disease. Status post angiogram 08/19/2015 with intervention.  LOS: 2 Rosio Weiss 8/17/201711:42 AM

## 2015-08-19 NOTE — H&P (Signed)
  [facility] History & Physical Update  The patient was interviewed and re-examined.  The patient's previous History and Physical has been reviewed and is unchanged.  There is no change in the plan of care. We plan to proceed with the scheduled procedure.  Liz Pinho, MD  08/19/2015, 8:06 AM

## 2015-08-19 NOTE — Progress Notes (Signed)
PT Cancellation Note  Patient Details Name: Devin Richmond MRN: [id number] DOB: May 03, 1965   Cancelled Treatment:    Reason Eval/Treat Not Completed: Patient at procedure or test/unavailable (Chart reviewed for re-attempted evaluation.  Patient off unit for diagnostic procedure/angiogram.  Will re-attempt at later time/date as patient available and medically appropriate.)   Michaela Broski H. Manson PasseyBrown, PT, DPT, NCS 08/19/15, 10:02 AM [phone]

## 2015-08-19 NOTE — Clinical Social Work Note (Signed)
CSW had long conversation on phone with patient's daughter: Ms. Devin Richmond. Ms. Devin Richmond is fine with consent being obtained by either herself or patient's sister. She stated that patient's sister if far away and is unreachable at times. She stated that she is concerned about her dad being depressed because he has had several losses. Ms. Devin Richmond updated on current CSW plan and is in agreement with whichever level of care is needed. York SpanielMonica Davionna Blacksher MSW,LCSW [phone]

## 2015-08-19 NOTE — Progress Notes (Signed)
PT Cancellation Note  Patient Details Name: [place] Callasric Christopher Skeet MRN: [id number] DOB: 1965/10/07   Cancelled Treatment:    Reason Eval/Treat Not Completed: Patient at procedure or test/unavailable (Patient now off-unit for dialysis.  Will re-attempt next date as available.)   Makyla Bye H. Manson PasseyBrown, PT, DPT, NCS 08/19/15, 11:53 AM [phone]

## 2015-08-19 NOTE — Progress Notes (Signed)
PRE HD INFO 

## 2015-08-19 NOTE — Progress Notes (Signed)
END OF HD 

## 2015-08-19 NOTE — Clinical Social Work Note (Signed)
Clinical Social Work Assessment  Patient Details  Name: Devin Richmond MRN: [id number] Date of Birth: 02-25-1965  Date of referral:  08/19/15               Reason for consult:  Facility Placement                Permission sought to share information with:    Permission granted to share information::     Name::        Agency::     Relationship::     Contact Information:     Housing/Transportation Living arrangements for the past 2 months:  Single Family Home Source of Information:  Facility Patient Interpreter Needed:  None Criminal Activity/Legal Involvement Pertinent to Current Situation/Hospitalization:  No - Comment as needed Significant Relationships:  Siblings, Adult Children Lives with:    Do you feel safe going back to the place where you live?  No Need for family participation in patient care:  No (Coment)  Care giving concerns:  Patient resides in [facility].    Social Worker assessment / plan:  CSW has attempted to contact patient's sister: Devin Richmond: [phone] but there was no answer and could not leave a voicemail. [facility] Devin [facility]: [phone] or [phone]. Ms. Devin Richmond stated that patient has been with her since last October and that she will be able to take patient back at discharge but would prefer home health. CSW informed her that PT is pending and we may need to look at a higher level of care but are [facility]. CSW attempted again to reach patient's sister and was successful. Patient's sister confirmed [facility] and that she would want him to return at discharge if possible.   Employment status:  Disabled (Comment on whether or not currently receiving Disability) Insurance information:  Medicare PT Recommendations:  Not assessed at this time Information / Referral to community resources:     Patient/Family's Response to care:  Patient's sister expressed appreciation for CSW assistance.    Patient/Family's Understanding of and Emotional Response to Diagnosis, Current Treatment, and Prognosis:  Patient's sister stated that she was not as informed with what was going on with her brother but that she does know that he had a procedure. CSW updated patient's sister and that we were waiting on PT to assess him.   Emotional Assessment Appearance:  Appears stated age Attitude/Demeanor/Rapport:    Affect (typically observed):  Calm Orientation:  Oriented to Self, Oriented to Place Alcohol / Substance use:  Not Applicable Psych involvement (Current and /or in the community):  No (Comment)  Discharge Needs  Concerns to be addressed:  Care Coordination Readmission within the last 30 days:  No Current discharge risk:  None Barriers to Discharge:  No Barriers Identified   York SpanielMonica Kieu Quiggle, LCSW 08/19/2015, 3:56 PM

## 2015-08-19 NOTE — Progress Notes (Signed)
Inpatient Diabetes Program Recommendations  AACE/ADA: New Consensus Statement on Inpatient Glycemic Control (2015)  Target Ranges:  Prepandial:   less than 140 mg/dL      Peak postprandial:   less than 180 mg/dL (1-2 hours)      Critically ill patients:  140 - 180 mg/dL   Lab Results  Component Value Date   GLUCAP 80 08/19/2015   HGBA1C 9.5 (H) 08/18/2015    Review of Glycemic Control  Results for [name], Devin Richmond (MRN [id number]) as of 08/19/2015 10:25  Ref. Range 08/18/2015 08:11 08/18/2015 11:48 08/18/2015 16:44 08/18/2015 21:27 08/19/2015 10:20  Glucose-Capillary Latest Ref Range: 65 - 99 mg/dL 409246 (H) 811210 (H) 914220 (H) 175 (H) 80     Diabetes history: DM2 Outpatient Diabetes medications: Levemir 10 units QHS, Novolog 2-10 units TID with meals Current orders for Inpatient glycemic control: Levemir 10 units QHS, Novolog 0-15 units TID with meals, Novolog 0-5 units QHS  Inpatient Diabetes Program Recommendations: Agree with current orders for blood sugar management.    NOTE: Consult for Diabetes Coordinator noted and chart reviewed.   Susette RacerJulie Dewey Viens, RN, BA, MHA, CDE Diabetes Coordinator Inpatient Diabetes Program  [phone] (Team Pager) [phone] [facility] Office) 08/19/2015 10:29 AM

## 2015-08-19 NOTE — Progress Notes (Signed)
Day of Surgery  Subjective: Patient seen. A little bit more conversant today.  Objective: Vital signs in last 24 hours: Temp:  [98.1 F (36.7 C)-98.7 F (37.1 C)] 98.2 F (36.8 C) (08/17 1437) Pulse Rate:  [91-105] 105 (08/17 1437) Resp:  [0-22] 20 (08/17 1437) BP: (97-150)/(49-97) 103/61 (08/17 1437) SpO2:  [91 %-99 %] 98 % (08/17 1413)    Intake/Output from previous day: 08/16 0701 - 08/17 0700 In: 434 [P.O.:280; I.V.:20; IV Piggyback:134] Out: 0  Intake/Output this shift: Total I/O In: 120 [P.O.:120] Out: 1500 [Other:1500]  Continued gangrenous changes in the left great toe.  Lab Results:   Recent Labs  08/18/15 0441 08/19/15 0441  WBC 22.6* 21.5*  HGB 11.7* 11.7*  HCT 35.6* 35.3*  PLT 239 239   BMET  Recent Labs  08/17/15 1804 08/18/15 0441  NA 136 139  K 5.8* 5.0  CL 94* 94*  CO2 25 31  GLUCOSE 316* 256*  BUN 89* 71*  CREATININE 18.25* 13.51*  CALCIUM 7.9* 8.0*   PT/INR No results for input(s): LABPROT, INR in the last 72 hours. ABG No results for input(s): PHART, HCO3 in the last 72 hours.  Invalid input(s): PCO2, PO2  Studies/Results: Dg Chest Portable 1 View  Result Date: 08/17/2015 CLINICAL DATA:  Cough.  Weakness since Sunday. EXAM: PORTABLE CHEST 1 VIEW COMPARISON:  06/24/2015 FINDINGS: Midline trachea.  Normal heart size and mediastinal contours. Sharp costophrenic angles.  No pneumothorax.  Clear lungs. Patient rotated minimally right. IMPRESSION: No active disease. Electronically Signed   By: Kyle  Talbot M.D.   On: 08/17/2015 19:15    Anti-infectives: Anti-infectives    Start     Dose/Rate Route Frequency Ordered Stop   08/19/15 1200  vancomycin (VANCOCIN) IVPB 1000 mg/200 mL premix  Status:  Discontinued     1,000 mg 200 mL/hr over 60 Minutes Intravenous Every T-Th-Sa (Hemodialysis) 08/18/15 0038 08/18/15 1411   08/19/15 0815  cefUROXime (ZINACEF) 1.5 g in dextrose 5 % 50 mL IVPB     1.5 g 100 mL/hr over 30 Minutes Intravenous   Once 08/19/15 0812 08/19/15 0843   08/18/15 2200  Ampicillin-Sulbactam (UNASYN) 3 g in sodium chloride 0.9 % 100 mL IVPB     3 g 100 mL/hr over 60 Minutes Intravenous Every 24 hours 08/18/15 1418     08/18/15 1100  vancomycin (VANCOCIN) 1,500 mg in sodium chloride 0.9 % 500 mL IVPB  Status:  Discontinued     1,500 mg 250 mL/hr over 120 Minutes Intravenous STAT 08/18/15 1050 08/18/15 1411   08/18/15 0800  piperacillin-tazobactam (ZOSYN) IVPB 3.375 g  Status:  Discontinued     3.375 g 12.5 mL/hr over 240 Minutes Intravenous Every 12 hours 08/17/15 2140 08/17/15 2335   08/18/15 0800  piperacillin-tazobactam (ZOSYN) IVPB 3.375 g  Status:  Discontinued     3.375 g 12.5 mL/hr over 240 Minutes Intravenous Every 12 hours 08/18/15 0038 08/18/15 1411   08/18/15 0600  vancomycin (VANCOCIN) 500 mg in sodium chloride 0.9 % 100 mL IVPB  Status:  Discontinued     50 0 mg 100 mL/hr over 60 Minutes Intravenous  Once 08/18/15 0038 08/18/15 1050   08/17/15 2200  vancomycin (VANCOCIN) IVPB 1000 mg/200 mL premix  Status:  Discontinued     1,000 mg 200 mL/hr over 60 Minutes Intravenous Every T-Th-Sa (Hemodialysis) 08/17/15 2148 08/17/15 2335   08/17/15 1915  piperacillin-tazobactam (ZOSYN) IVPB 3.375 g     3.375 g 100 mL/hr over 30 Minutes Intravenous  Once  08/17/15 1906 08/17/15 1959   08/17/15 1900  vancomycin (VANCOCIN) IVPB 1000 mg/200 mL premix     1,000 mg 200 mL/hr over 60 Minutes Intravenous  Once 08/17/15 1855 08/17/15 2100   08/17/15 1900  piperacillin-tazobactam (ZOSYN) IVPB 3.375 g  Status:  Discontinued     3.375 g 12.5 mL/hr over 240 Minutes Intravenous  Once 08/17/15 1855 08/17/15 1906   08/17/15 1815  clindamycin (CLEOCIN) IVPB 600 mg     600 mg 100 mL/hr over 30 Minutes Intravenous  Once 08/17/15 1811 08/17/15 1911      Assessment/Plan: s/p Procedure(s): Lower Extremity Angiography (Left) Lower Extremity Intervention Assessment: Gangrene left great toe with peripheral vascular disease  status post revascularization   Plan: Discussed the patient's care earlier today with Dr. Wyn Quakerew. Discussed with the patient the need for amputation of his left great toe with partial removal of the first metatarsal to allow for skin closure. We discussed risks of the surgery and possible complications including failure to heal as well as continued infection as well as the risk of further amputation of the foot or leg. Questions were invited. The patient did seem to have full understanding of the above conversation and elects to proceed with amputation of the toe tomorrow. The patient will be nothing by mouth after breakfast tomorrow and we will obtain a consent for amputation left great toe with partial first ray resection. Hopefully we will be able to get this done tomorrow night, if not Saturday morning.  LOS: 2 days    Ricci Barkerodd W Raymie Giammarco 08/19/2015

## 2015-08-20 ENCOUNTER — Encounter: Payer: Self-pay | Admitting: Vascular Surgery

## 2015-08-20 LAB — CBC
HEMATOCRIT: 35.3 % — AB (ref 40.0–52.0)
Hemoglobin: 11.7 g/dL — ABNORMAL LOW (ref 13.0–18.0)
MCH: 31.8 pg (ref 26.0–34.0)
MCHC: 33.3 g/dL (ref 32.0–36.0)
MCV: 95.5 fL (ref 80.0–100.0)
PLATELETS: 258 10*3/uL (ref 150–440)
RBC: 3.7 MIL/uL — AB (ref 4.40–5.90)
RDW: 16.5 % — AB (ref 11.5–14.5)
WBC: 19.8 10*3/uL — AB (ref 3.8–10.6)

## 2015-08-20 LAB — GLUCOSE, CAPILLARY
GLUCOSE-CAPILLARY: 113 mg/dL — AB (ref 65–99)
GLUCOSE-CAPILLARY: 97 mg/dL (ref 65–99)
Glucose-Capillary: 140 mg/dL — ABNORMAL HIGH (ref 65–99)
Glucose-Capillary: 100 mg/dL — ABNORMAL HIGH (ref 65–99)

## 2015-08-20 MED ORDER — SODIUM CHLORIDE 0.9 % IV SOLN
25.0000 mg | Freq: Once | INTRAVENOUS | Status: AC
  Administered 2015-08-20: 25 mg via INTRAVENOUS
  Filled 2015-08-20: qty 1

## 2015-08-20 MED ORDER — INSULIN DETEMIR 100 UNIT/ML ~~LOC~~ SOLN
10.0000 [IU] | Freq: Every day | SUBCUTANEOUS | Status: DC
  Filled 2015-08-20: qty 0.1

## 2015-08-20 MED ORDER — CHLORPROMAZINE HCL 25 MG PO TABS
25.0000 mg | ORAL_TABLET | Freq: Three times a day (TID) | ORAL | Status: DC
  Administered 2015-08-20 (×2): 25 mg via ORAL
  Filled 2015-08-20 (×4): qty 1

## 2015-08-20 MED ORDER — INSULIN DETEMIR 100 UNIT/ML ~~LOC~~ SOLN
5.0000 [IU] | Freq: Every day | SUBCUTANEOUS | Status: DC
  Administered 2015-08-20 – 2015-08-24 (×5): 5 [IU] via SUBCUTANEOUS
  Filled 2015-08-20 (×6): qty 0.05

## 2015-08-20 NOTE — Care Management Important Message (Signed)
Important Message  Patient Details  Name: Devin Richmond MRN: [id number] Date of Birth: 21-Jan-1965   Medicare Important Message Given:  Yes    Marily MemosLisa M Stassi Fadely, RN 08/20/2015, 3:00 PM

## 2015-08-20 NOTE — Progress Notes (Signed)
Speech Language Pathology Dysphagia Treatment Patient Details Name: Devin Richmond MRN: [id number] DOB: 1965/10/17 Today's Date: 08/20/2015 Time: [id number] SLP Time Calculation (min) (ACUTE ONLY): 23 min  Assessment / Plan / Recommendation Clinical Impression   pt presents with a mild oral dysphagia characterized by lengthy mastication of solids and +5 minutes for one bite regular solid that the family brought in. SLP educated on MS diet, however family stated he is not eating food and would like him to have foods of preference for intake. SLP educated on safe choices from preferred restaurants, with family giving verbal understanding and agreement. Pt with no other overt ssx aspiration.     Diet Recommendation    Mech soft with thin.   SLP Plan   min 3x/wk  Pertinent Vitals/Pain No pain reported   Swallowing Goals     General HPI: Pt is a 50 y/o male with multiple medical issues including CVAs, HTN, DM and a known history of end-stage renal disease on T-Th-S dialysis, neuropathy, stroke, peripheral vascular disease, diabetic retinopathy, hypertension, diabetes mellitus presents from group home secondary to worsening left great toe gangrene. Pt has been feeling weaker and missed dialysis on day of admission. Pt's speech is slower and overall bolus management during oral intake is slower since previous CVA per Caregiver.  Oral Cavity - Oral Hygiene     Dysphagia Treatment     GO     Meredith PelStacie Harris [facility] 08/20/2015, 2:17 PM

## 2015-08-20 NOTE — Progress Notes (Signed)
[facility] Date of Admission:  08/17/2015     ID: [place] Callasric Christopher Hartwell is a 50 y.o. male with gangrene great toe Active Problems:   Ischemic foot   Subjective: S/p angioplasty 8/17  Had fever yest.   ROS  Unable to obtain   Medications:  Antibiotics Given (last 72 hours)    Date/Time Action Medication Dose Rate   08/18/15 0918 Given   piperacillin-tazobactam (ZOSYN) IVPB 3.375 g 3.375 g 12.5 mL/hr   08/18/15 1231 Given   vancomycin (VANCOCIN) 1,500 mg in sodium chloride 0.9 % 500 mL IVPB 1,500 mg 250 mL/hr   08/18/15 2151 Given   Ampicillin-Sulbactam (UNASYN) 3 g in sodium chloride 0.9 % 100 mL IVPB 3 g 100 mL/hr   08/19/15 0813 Given   cefUROXime (ZINACEF) 1.5 g in dextrose 5 % 50 mL IVPB 1.5 g 100 mL/hr   08/19/15 2144 Given   Ampicillin-Sulbactam (UNASYN) 3 g in sodium chloride 0.9 % 100 mL IVPB 3 g 100 mL/hr     . ampicillin-sulbactam (UNASYN) IV  3 g Intravenous Q24H  . atorvastatin  80 mg Oral QHS  . atropine  1 drop Left Eye BID  . carvedilol  12.5 mg Oral BID WC  . chlorproMAZINE  25 mg Oral TID  . cinacalcet  90 mg Oral Q breakfast  . famotidine  20 mg Oral Daily  . folic acid  500 mcg Oral Daily  . heparin  5,000 Units Subcutaneous Q8H  . insulin aspart  0-15 Units Subcutaneous TID WC  . insulin aspart  0-5 Units Subcutaneous QHS  . insulin aspart  3 Units Subcutaneous TID WC  . insulin detemir  10 Units Subcutaneous QHS  . lanthanum  1,000 mg Oral TID WC  . loratadine  10 mg Oral Daily  . meclizine  25 mg Oral BID  . mirtazapine  30 mg Oral QHS  . sevelamer carbonate  0.8 g Oral TID WC    Objective: Vital signs in last 24 hours: Temp:  [97.6 F (36.4 C)-101.6 F (38.7 C)] 97.6 F (36.4 C) (08/18 1311) Pulse Rate:  [84-120] 91 (08/18 1313) Resp:  [16-20] 16 (08/18 1311) BP: (96-122)/(52-65) 108/57 (08/18 1313) SpO2:  [90 %-99 %] 97 % (08/18 1311) Constitutional: lying in bed, barely verbal, HENT:  anicteric Mouth/Throat: Oropharynx is clear and moist. No oropharyngeal exudate.  Cardiovascular: Normal rate, regular rhythm and normal heart sounds. Pulmonary/Chest: Effort normal and breath sounds normal. No respiratory distress. He has no wheezes.  Abdominal: Soft. Bowel sounds are normal.He has no cervical adenopathy.  Neurological:minimally interactive Ext no edema vasc diminished pulsed DP and PT bil, feet are both cool Skin: L great toe with tip with gangrenous changes, foul odor, no drainage noted,  Psychiatric: He has a normal mood and affect. His behavior is normal.   Lab Results  Recent Labs  08/17/15 1804 08/18/15 0441 08/19/15 0441 08/20/15 0550  WBC 21.6* 22.6* 21.5* 19.8*  HGB 12.1* 11.7* 11.7* 11.7*  HCT 37.1* 35.6* 35.3* 35.3*  NA 136 139  --   --   K 5.8* 5.0  --   --   CL 94* 94*  --   --   CO2 25 31  --   --   BUN 89* 71*  --   --   CREATININE 18.25* 13.51*  --   --     Microbiology: Results for orders placed or performed during the hospital encounter of 08/17/15  Culture, blood (Routine  X 2) w Reflex to ID Panel     Status: None (Preliminary result)   Collection Time: 08/17/15  6:10 PM  Result Value Ref Range Status   Specimen Description BLOOD  Final   Special Requests 4ML  Final   Culture NO GROWTH 3 DAYS  Final   Report Status PENDING  Incomplete  Culture, blood (Routine X 2) w Reflex to ID Panel     Status: None (Preliminary result)   Collection Time: 08/17/15  6:40 PM  Result Value Ref Range Status   Specimen Description BLOOD  Final   Special Requests 4ML  Final   Culture NO GROWTH 3 DAYS  Final   Report Status PENDING  Incomplete  MRSA PCR Screening     Status: None   Collection Time: 08/18/15  3:00 AM  Result Value Ref Range Status   MRSA by PCR NEGATIVE NEGATIVE Final    Comment:        The GeneXpert MRSA Assay (FDA approved for NASAL specimens only), is one component of a comprehensive MRSA colonization surveillance program. It is  not intended to diagnose MRSA infection nor to guide or monitor treatment for MRSA infections.     Studies/Results:  Dg Chest Portable 1 View  Result Date: 08/17/2015 CLINICAL DATA:  Cough.  Weakness since Sunday. EXAM: PORTABLE CHEST 1 VIEW COMPARISON:  06/24/2015 FINDINGS: Midline trachea.  Normal heart size and mediastinal contours. Sharp costophrenic angles.  No pneumothorax.  Clear lungs. Patient rotated minimally right. IMPRESSION: No active disease. Electronically Signed   By: Jeronimo GreavesKyle  Talbot M.D.   On: 08/17/2015 19:15   Dg Foot Complete Left  Result Date: 08/17/2015 CLINICAL DATA:  Ischemic great toe. Left great toe wound for 2 months. EXAM: LEFT FOOT - COMPLETE 3+ VIEW COMPARISON:  None. FINDINGS: There is soft tissue ulceration involving the great toe at the level of the distal phalanx with heterogeneous lucency compatible with soft tissue emphysema. No definite underlying osseous erosion is identified to indicate osteomyelitis. No acute fracture or dislocation is identified. There are dorsiflexion deformities at the second through fifth MCP joints. Diffuse atherosclerotic vascular calcification is present. There is diffuse soft tissue swelling about the forefoot. IMPRESSION: Soft tissue ulceration and soft tissue emphysema involving the left great toe. No definite radiographic evidence of osteomyelitis. Electronically Signed   By: Sebastian AcheAllen  Grady M.D.   On: 08/17/2015 17:57    Assessment/Plan: [place] Callasric Christopher Winkowski is a 50 y.o. male with ESRD, HTN, PVD, DM admitted with gangrenous changes and infection of L great toe.  Will undergo angiogram and possible revascularization but will need amputation as well of at least the toe.On admit wbc was 22, no fevers and started on vanco zosyn. He had Grp G strep bacteremia in June  And was at one point treated with keflex. Unclear other abx use recently. He is allergic to bactrim  He is now s/ p revasc and for toe amputation  today  Recommendations Cont unasyn while inpatient Unless Podiatry or vascular feels strongly about it I do not think he will need IV abx at dc.  At DC would suggest change to augmentin 500 QD (renal dosing with ESRD) for a 2 week post op course.  Thank you very much for the consult. Will follow with you.  Ercell Razon P   08/20/2015, 2:30 PM

## 2015-08-20 NOTE — Evaluation (Signed)
Physical Therapy Evaluation Patient Details Name: [place] Callasric Christopher Faulcon MRN: [id number] DOB: 24-May-1965 Today's Date: 08/20/2015   History of Present Illness  50 yo male with history of gangrene in L great toe received balloon angioplasty to R femoral artery, now is scheduled for amputation of L great toe, either later today or tomorrow.  PMHx:  anemia, CVA, DM, retinopathy, neuropathy, pancreatitis  Clinical Impression  Pt is not able to respond well to therapist today but is possibly very medicated as he reports no pain.   He will be seen for acute therapy to attempt gait after his surgery later today or tomorrow.     Follow Up Recommendations SNF    Equipment Recommendations  None recommended by PT    Recommendations for Other Services Rehab consult     Precautions / Restrictions Precautions Precautions: Fall (telemetry) Restrictions Weight Bearing Restrictions: No Other Position/Activity Restrictions: would minimize pressure on L foot due to skin changes      Mobility  Bed Mobility Overal bed mobility: Needs Assistance;+2 for physical assistance;+ 2 for safety/equipment Bed Mobility: Supine to Sit     Supine to sit: Total assist;HOB elevated (Pt resisted the effort)        Transfers                 General transfer comment: Pt is dependent due to lethargy  Ambulation/Gait                Stairs            Wheelchair Mobility    Modified Rankin (Stroke Patients Only)       Balance Overall balance assessment:  (pt was unable to be assisted to sit up on side of bed)                                           Pertinent Vitals/Pain Pain Assessment: No/denies pain    Home Living Family/patient expects to be discharged to:: Group home                      Prior Function Level of Independence:  (no verbal information from pt)               Hand Dominance        Extremity/Trunk Assessment   Upper  Extremity Assessment: Generalized weakness           Lower Extremity Assessment: Generalized weakness      Cervical / Trunk Assessment: Normal  Communication   Communication:  (non verbal and lethargic)  Cognition Arousal/Alertness: Lethargic;Suspect due to medications Behavior During Therapy: Flat affect Overall Cognitive Status: No family/caregiver present to determine baseline cognitive functioning       Memory:  (cannot determine what is reduced)              General Comments General comments (skin integrity, edema, etc.): Pt is unable to be assisted to bedside and resists efforts, but is previously able to move before hospitalization    Exercises        Assessment/Plan    PT Assessment Patient needs continued PT services  PT Diagnosis Generalized weakness   PT Problem List Decreased strength;Decreased range of motion;Decreased activity tolerance;Decreased balance;Decreased mobility;Decreased coordination;Decreased cognition;Decreased knowledge of use of DME;Decreased safety awareness;Decreased knowledge of precautions;Decreased skin integrity  PT Treatment Interventions DME instruction;Gait training;Functional mobility training;Therapeutic activities;Therapeutic  exercise;Balance training;Neuromuscular re-education;Patient/family education   PT Goals (Current goals can be found in the Care Plan section) Acute Rehab PT Goals Patient Stated Goal: none stated PT Goal Formulation: Patient unable to participate in goal setting Time For Goal Achievement: 09/03/15 Potential to Achieve Goals: Fair    Frequency Min 2X/week   Barriers to discharge Other (comment) (is a 2 person assist for all mobility)      Co-evaluation               End of Session   Activity Tolerance: Treatment limited secondary to medical complications (Comment);Patient limited by lethargy Patient left: in bed;with call bell/phone within reach;with bed alarm set Nurse Communication:  Mobility status         Time: 1005-1020 PT Time Calculation (min) (ACUTE ONLY): 15 min   Charges:   PT Evaluation $PT Eval Low Complexity: 1 Procedure     PT G CodesIvar Drape:        Syenna Nazir E 08/20/2015, 5:09 PM    Samul Dadauth Juliya Magill, PT MS Acute Rehab Dept. Number: [facility] [phone] and [facility] [phone]

## 2015-08-20 NOTE — Progress Notes (Signed)
Per Dr. Alberteen Spindleline patient will not be having surgery today. Place order to put patient back on previous diet and make NPO after midnight tonight

## 2015-08-20 NOTE — Progress Notes (Signed)
Notified Dr. Juliene PinaMody of BG of 100. Per MD discontinue meal coverage of 3 units. Decrease levemir to 5 units at bedtime. Otilio JeffersonMadelyn S Fenton, RN

## 2015-08-20 NOTE — Plan of Care (Signed)
Problem: Activity: Goal: Risk for activity intolerance will decrease Outcome: Not Progressing Pt bedbound.  Problem: Fluid Volume: Goal: Ability to maintain a balanced intake and output will improve Outcome: Not Progressing Pt refuses meals, has had poor intake.  Problem: Nutrition: Goal: Adequate nutrition will be maintained Outcome: Not Progressing Pt has been refusing meals.

## 2015-08-20 NOTE — Progress Notes (Signed)
[employer] - [place] [facility]   PATIENT NAME: Devin Richmond    MR#:  [id number]  DATE OF BIRTH:  [birth year]  SUBJECTIVE:  Doing ok this morning. No acute complaints.  REVIEW OF SYSTEMS:    Review of Systems  Constitutional: Negative.  Negative for chills, fever and malaise/fatigue.  HENT: Negative.  Negative for ear discharge, ear pain, hearing loss, nosebleeds and sore throat.   Eyes: Negative.  Negative for blurred vision and pain.  Respiratory: Negative.  Negative for cough, hemoptysis, shortness of breath and wheezing.   Cardiovascular: Negative.  Negative for chest pain, palpitations and leg swelling.  Gastrointestinal: Negative.  Negative for abdominal pain, blood in stool, diarrhea, nausea and vomiting.  Genitourinary: Negative.  Negative for dysuria.  Musculoskeletal: Negative.  Negative for back pain.  Skin:       Toe malodorous  Neurological: Negative for dizziness, tremors, speech change, focal weakness, seizures and headaches.  Endo/Heme/Allergies: Negative.  Does not bruise/bleed easily.  Psychiatric/Behavioral: Negative.  Negative for depression, hallucinations and suicidal ideas.    Tolerating Diet: yes      DRUG ALLERGIES:   Allergies  Allergen Reactions  . Sulfa Antibiotics Other (See Comments)    unknown    VITALS:  Blood pressure 122/62, pulse (!) 103, temperature 99.7 F (37.6 C), temperature source Oral, resp. rate 18, height 6' (1.829 m), weight 86.1 kg (189 lb 13.1 oz), SpO2 90 %.  PHYSICAL EXAMINATION:   Physical Exam  Constitutional: He is oriented to person, place, and time and well-developed, well-nourished, and in no distress. No distress.  HENT:  Head: Normocephalic.  Eyes: No scleral icterus.  Neck: Normal range of motion. Neck supple. No JVD present. No tracheal deviation present.  Cardiovascular: Normal rate, regular rhythm and normal heart sounds.  Exam reveals no gallop and no friction rub.   No murmur  heard. Pulmonary/Chest: Effort normal and breath sounds normal. No respiratory distress. He has no wheezes. He has no rales. He exhibits no tenderness.  Abdominal: Soft. Bowel sounds are normal. He exhibits no distension and no mass. There is no tenderness. There is no rebound and no guarding.  Musculoskeletal: Normal range of motion. He exhibits no edema.  Neurological: He is alert and oriented to person, place, and time.  Slow  Skin: Skin is warm.  Toe is wrapped  Psychiatric: Affect and judgment normal.      LABORATORY PANEL:   CBC  Recent Labs Lab 08/20/15 0550  WBC 19.8*  HGB 11.7*  HCT 35.3*  PLT 258   ------------------------------------------------------------------------------------------------------------------  Chemistries   Recent Labs Lab 08/18/15 0441  NA 139  K 5.0  CL 94*  CO2 31  GLUCOSE 256*  BUN 71*  CREATININE 13.51*  CALCIUM 8.0*   ------------------------------------------------------------------------------------------------------------------  Cardiac Enzymes No results for input(s): TROPONINI in the last 168 hours. ------------------------------------------------------------------------------------------------------------------  RADIOLOGY:  No results found.   ASSESSMENT AND PLAN:   50 y.o. malewith a known history of End-stage renal disease on T-Th-S dialysis, neuropathy, stroke, peripheral vascular disease, diabetic retinopathy, hypertension, diabetes mellitus presents from group home secondary to worsening left great toe gangrene.   1 Sepsis due to left great toe gangrene with probable underlying osteomyelitis/PAD He presented with leukocytosis and hypotension.  Patient has been evaluated by podiatry and Undergo toe amputation tonight.  He underwent angiogram today with Percutaneous transluminal angioplasty of distal posterior tibial artery andmore proximal posterior tibial artery, percutaneous transluminal  angioplasty of the tibioperoneal trunk and origin of the  posterior tibial artery and percutaneous transluminal angioplasty of the peroneal artery  He is currently on IV Unasyn as per ID consult. MRSA screen was negative. Appreciate ID, podiatry, wound care and vascular surgery consult. Blood cultures are negative today.  As per wound care: Cleanse left great toe with NS. Apply dry dressing and secure with tape. Change daily  2 hyperkalemia: resolved after HD   3 ESRD on hemodialysis: Appreciate Nephrology consult. Patient is scheduled for dialysis Tuesday, Thursday and Saturday..  4 diabetes mellitus uncontrolled with DM retinopathy: Appreciate diabetes coordinator consult. Continue Levemir and pre-meal insulin with sliding scale insulin and ADA diet.  5. History of CVA: Continue statin however Plavix is on hold due to need for surgery. This should be restarted after surgery.  Speech consult for swallow evaluation: Pt will safely tolerate po diet of least restrictive consistency w/ no overt s/s of aspiration        Management plans discussed with the patient and he is in agreement.  CODE STATUS: full  TOTAL TIME TAKING CARE OF THIS PATIENT: 25 minutes.     POSSIBLE D/C Sunday or Monday, DEPENDING ON CLINICAL CONDITION.   Forrestine Lecrone M.D on 08/20/2015 at 11:20 AM  Between 7am to 6pm - Pager - [phone] After 6pm go to [email or web address] - password [email or web address]  [facility]  Office  [phone]  CC: Primary care physician; Leotis ShamesSingh,Jasmine, MD  Note: This dictation was prepared with Dragon dictation along with smaller phrase technology. Any transcriptional errors that result from this process are unintentional.

## 2015-08-20 NOTE — Progress Notes (Signed)
[facility]  ROUNDING NOTE   Subjective:  Patient seen at bedside. He completed hemodialysis yesterday. He is due for either surgery tonight or early tomorrow morning.    Objective:  Vital signs in last 24 hours:  Temp:  [98.2 F (36.8 C)-101.6 F (38.7 C)] 99.7 F (37.6 C) (08/18 0513) Pulse Rate:  [100-120] 103 (08/18 0818) Resp:  [14-22] 18 (08/18 0513) BP: (103-150)/(52-97) 122/62 (08/18 0818) SpO2:  [90 %-99 %] 90 % (08/18 0513)  Weight change:  Filed Weights   08/17/15 1627 08/17/15 2050  Weight: 86.2 kg (190 lb) 86.1 kg (189 lb 13.1 oz)    Intake/Output: I/O last 3 completed shifts: In: 620 [P.O.:400; I.V.:20; IV Piggyback:200] Out: 1500 [Other:1500]   Intake/Output this shift:  No intake/output data recorded.  Physical Exam: General: No acute distress  Head: Normocephalic, atraumatic. Moist oral mucosal membranes  Eyes: Anicteric  Neck: Supple, trachea midline  Lungs:  Clear to auscultation, normal effort  Heart: S1S2 no rubs  Abdomen:  Soft, nontender, BS present  Extremities: trace peripheral edema, left foot wrapped  Neurologic: Nonfocal, moving all four extremities  Skin: No lesions  Access: Left upper extremity AV fistula     Basic Metabolic Panel:  Recent Labs Lab 08/17/15 1804 08/18/15 0441  NA 136 139  K 5.8* 5.0  CL 94* 94*  CO2 25 31  GLUCOSE 316* 256*  BUN 89* 71*  CREATININE 18.25* 13.51*  CALCIUM 7.9* 8.0*    Liver Function Tests: No results for input(s): AST, ALT, ALKPHOS, BILITOT, PROT, ALBUMIN in the last 168 hours. No results for input(s): LIPASE, AMYLASE in the last 168 hours. No results for input(s): AMMONIA in the last 168 hours.  CBC:  Recent Labs Lab 08/17/15 1804 08/18/15 0441 08/19/15 0441 08/20/15 0550  WBC 21.6* 22.6* 21.5* 19.8*  HGB 12.1* 11.7* 11.7* 11.7*  HCT 37.1* 35.6* 35.3* 35.3*  MCV 96.2 95.3 95.1 95.5  PLT 227 239 239 258    Cardiac Enzymes: No results for input(s): CKTOTAL,  CKMB, CKMBINDEX, TROPONINI in the last 168 hours.  BNP: Invalid input(s): POCBNP  CBG:  Recent Labs Lab 08/19/15 1020 08/19/15 1443 08/19/15 1654 08/19/15 2123 08/20/15 0749  GLUCAP 80 75 123* 94 140*    Microbiology: Results for orders placed or performed during the hospital encounter of 08/17/15  Culture, blood (Routine X 2) w Reflex to ID Panel     Status: None (Preliminary result)   Collection Time: 08/17/15  6:10 PM  Result Value Ref Range Status   Specimen Description BLOOD  Final   Special Requests 4ML  Final   Culture NO GROWTH 3 DAYS  Final   Report Status PENDING  Incomplete  Culture, blood (Routine X 2) w Reflex to ID Panel     Status: None (Preliminary result)   Collection Time: 08/17/15  6:40 PM  Result Value Ref Range Status   Specimen Description BLOOD  Final   Special Requests 4ML  Final   Culture NO GROWTH 3 DAYS  Final   Report Status PENDING  Incomplete  MRSA PCR Screening     Status: None   Collection Time: 08/18/15  3:00 AM  Result Value Ref Range Status   MRSA by PCR NEGATIVE NEGATIVE Final    Comment:        The GeneXpert MRSA Assay (FDA approved for NASAL specimens only), is one component of a comprehensive MRSA colonization surveillance program. It is not intended to diagnose MRSA infection nor to guide  or monitor treatment for MRSA infections.     Coagulation Studies: No results for input(s): LABPROT, INR in the last 72 hours.  Urinalysis: No results for input(s): COLORURINE, LABSPEC, PHURINE, GLUCOSEU, HGBUR, BILIRUBINUR, KETONESUR, PROTEINUR, UROBILINOGEN, NITRITE, LEUKOCYTESUR in the last 72 hours.  Invalid input(s): APPERANCEUR    Imaging: No results found.   Medications:     . ampicillin-sulbactam (UNASYN) IV  3 g Intravenous Q24H  . atorvastatin  80 mg Oral QHS  . atropine  1 drop Left Eye BID  . carvedilol  12.5 mg Oral BID WC  . cinacalcet  90 mg Oral Q breakfast  . famotidine  20 mg Oral Daily  . folic acid   500 mcg Oral Daily  . heparin  5,000 Units Subcutaneous Q8H  . insulin aspart  0-15 Units Subcutaneous TID WC  . insulin aspart  0-5 Units Subcutaneous QHS  . insulin aspart  3 Units Subcutaneous TID WC  . insulin detemir  15 Units Subcutaneous QHS  . lanthanum  1,000 mg Oral TID WC  . loratadine  10 mg Oral Daily  . meclizine  25 mg Oral BID  . mirtazapine  30 mg Oral QHS  . sevelamer carbonate  0.8 g Oral TID WC     Assessment/ Plan:  50 y.o. male with past medical history of end-stage renal disease on hemodialysis TTS, diabetes will start 2, hypertension, history of CVA, anemia of chronic kidney disease, secondary hyperparathyroidism, resides at assisted living facility, peripheral vascular disease, necrotic left first toe  [facility]/TTS  1. End-stage renal disease TTS: Patient completed hemodialysis yesterday. No acute indication for dialysis today. We will plan for dialysis again tomorrow.   2. Hyperkalemia. Resolved with dialysis. Continue to periodically monitor serum potassium.  3. Secondary hyperparathyroidism.  Continue Renvela, Fosrenol, and Sensipar.  4. Anemia of chronic kidney disease. Hemoglobin stable at 11.7. Epogen on hold.   5.  Gangrene of left first toe/peripheral vascular disease. Status post angiogram 08/19/2015 with intervention.  Patient due for left first toe amputation either today or tomorrow.  LOS: 3 Wayland Baik 8/18/201711:09 AM

## 2015-08-21 ENCOUNTER — Encounter: Payer: Self-pay | Admitting: Anesthesiology

## 2015-08-21 ENCOUNTER — Inpatient Hospital Stay: Payer: Medicare Other | Admitting: Anesthesiology

## 2015-08-21 ENCOUNTER — Encounter
Admission: EM | Disposition: A | Discharge status: Skilled Nursing Facility | Payer: Self-pay | Source: Home / Self Care | Attending: Internal Medicine

## 2015-08-21 HISTORY — PX: AMPUTATION TOE: SHX6595

## 2015-08-21 LAB — GLUCOSE, CAPILLARY
GLUCOSE-CAPILLARY: 111 mg/dL — AB (ref 65–99)
GLUCOSE-CAPILLARY: 132 mg/dL — AB (ref 65–99)
GLUCOSE-CAPILLARY: 114 mg/dL — AB (ref 65–99)

## 2015-08-21 LAB — CBC
HEMATOCRIT: 34.7 % — AB (ref 40.0–52.0)
HEMOGLOBIN: 11.4 g/dL — AB (ref 13.0–18.0)
MCH: 31.7 pg (ref 26.0–34.0)
MCHC: 32.7 g/dL (ref 32.0–36.0)
MCV: 96.8 fL (ref 80.0–100.0)
Platelets: 268 10*3/uL (ref 150–440)
RBC: 3.58 MIL/uL — AB (ref 4.40–5.90)
RDW: 16.5 % — ABNORMAL HIGH (ref 11.5–14.5)
WBC: 17.3 10*3/uL — ABNORMAL HIGH (ref 3.8–10.6)

## 2015-08-21 SURGERY — Surgical Case
Anesthesia: *Unknown

## 2015-08-21 SURGERY — AMPUTATION, TOE
Anesthesia: General | Laterality: Left | Wound class: Dirty or Infected

## 2015-08-21 MED ORDER — PROPOFOL 10 MG/ML IV BOLUS
INTRAVENOUS | Status: DC | PRN
  Administered 2015-08-21: 100 mg via INTRAVENOUS

## 2015-08-21 MED ORDER — ACETAMINOPHEN 10 MG/ML IV SOLN
INTRAVENOUS | Status: DC | PRN
  Administered 2015-08-21: 1000 mg via INTRAVENOUS

## 2015-08-21 MED ORDER — LIDOCAINE HCL (CARDIAC) 20 MG/ML IV SOLN: INTRAVENOUS | Status: DC | PRN

## 2015-08-21 MED ORDER — OXYCODONE-ACETAMINOPHEN 5-325 MG PO TABS
1.0000 | ORAL_TABLET | ORAL | Status: DC | PRN
  Administered 2015-08-25: 2 via ORAL
  Filled 2015-08-21: qty 2

## 2015-08-21 MED ORDER — MORPHINE SULFATE (PF) 2 MG/ML IV SOLN: 2.0000 mg | INTRAVENOUS | Status: DC | PRN

## 2015-08-21 MED ORDER — FENTANYL CITRATE (PF) 100 MCG/2ML IJ SOLN
INTRAMUSCULAR | Status: DC | PRN
  Administered 2015-08-21 (×2): 50 ug via INTRAVENOUS

## 2015-08-21 MED ORDER — SODIUM CHLORIDE 0.9 % IV SOLN
INTRAVENOUS | Status: DC | PRN
  Administered 2015-08-21: 08:00:00 via INTRAVENOUS

## 2015-08-21 MED ORDER — MIDAZOLAM HCL 2 MG/2ML IJ SOLN
INTRAMUSCULAR | Status: DC | PRN
  Administered 2015-08-21: 1 mg via INTRAVENOUS

## 2015-08-21 MED ORDER — NEOMYCIN-POLYMYXIN B GU 40-200000 IR SOLN
Status: DC | PRN
  Administered 2015-08-21: 2 mL

## 2015-08-21 MED ORDER — ONDANSETRON HCL 4 MG/2ML IJ SOLN: 4.0000 mg | Freq: Once | INTRAMUSCULAR | Status: DC | PRN

## 2015-08-21 MED ORDER — BUPIVACAINE HCL 0.5 % IJ SOLN
INTRAMUSCULAR | Status: DC | PRN
  Administered 2015-08-21: 8 mL

## 2015-08-21 MED ORDER — FENTANYL CITRATE (PF) 100 MCG/2ML IJ SOLN: 25.0000 ug | INTRAMUSCULAR | Status: DC | PRN

## 2015-08-21 MED ORDER — GLYCOPYRROLATE 0.2 MG/ML IJ SOLN
INTRAMUSCULAR | Status: DC | PRN
  Administered 2015-08-21: 0.2 mg via INTRAVENOUS

## 2015-08-21 MED ORDER — PHENYLEPHRINE HCL 10 MG/ML IJ SOLN
INTRAMUSCULAR | Status: DC | PRN
  Administered 2015-08-21: 50 ug via INTRAVENOUS
  Administered 2015-08-21: 200 ug via INTRAVENOUS

## 2015-08-21 MED ORDER — LABETALOL HCL 5 MG/ML IV SOLN
INTRAVENOUS | Status: DC | PRN
  Administered 2015-08-21: 5 mg via INTRAVENOUS

## 2015-08-21 MED ORDER — ONDANSETRON HCL 4 MG/2ML IJ SOLN
INTRAMUSCULAR | Status: DC | PRN
  Administered 2015-08-21: 4 mg via INTRAVENOUS

## 2015-08-21 SURGICAL SUPPLY — 50 items
BANDAGE ACE 4X5 VEL STRL LF (GAUZE/BANDAGES/DRESSINGS) ×3 IMPLANT
BLADE MED AGGRESSIVE (BLADE) ×3 IMPLANT
BLADE OSC/SAGITTAL MD 5.5X18 (BLADE) ×3 IMPLANT
BLADE SURG 15 STRL LF DISP TIS (BLADE) ×2 IMPLANT
BLADE SURG 15 STRL SS (BLADE) ×6
BLADE SURG MINI STRL (BLADE) ×3 IMPLANT
BNDG CMPR 75X21 PLY HI ABS (MISCELLANEOUS) ×1
BNDG ESMARK 4X12 TAN STRL LF (GAUZE/BANDAGES/DRESSINGS) ×3 IMPLANT
BNDG GAUZE 4.5X4.1 6PLY STRL (MISCELLANEOUS) ×3 IMPLANT
CANISTER SUCT 1200ML W/VALVE (MISCELLANEOUS) ×3 IMPLANT
CLOSURE WOUND 1/4X4 (GAUZE/BANDAGES/DRESSINGS)
CUFF TOURN 18 STER (MISCELLANEOUS) ×3 IMPLANT
CUFF TOURN DUAL PL 12 NO SLV (MISCELLANEOUS) ×1 IMPLANT
DRAPE FLUOR MINI C-ARM 54X84 (DRAPES) ×3 IMPLANT
DURAPREP 26ML APPLICATOR (WOUND CARE) ×3 IMPLANT
ELECT REM PT RETURN 9FT ADLT (ELECTROSURGICAL) ×3
ELECTRODE REM PT RTRN 9FT ADLT (ELECTROSURGICAL) ×1 IMPLANT
GAUZE PETRO XEROFOAM 1X8 (MISCELLANEOUS) ×3 IMPLANT
GAUZE SPONGE 4X4 12PLY STRL (GAUZE/BANDAGES/DRESSINGS) ×3 IMPLANT
GAUZE STRETCH 2X75IN STRL (MISCELLANEOUS) ×3 IMPLANT
GLOVE BIO SURGEON STRL SZ7.5 (GLOVE) ×5 IMPLANT
GLOVE INDICATOR 8.0 STRL GRN (GLOVE) ×3 IMPLANT
GOWN STRL REUS W/ TWL LRG LVL3 (GOWN DISPOSABLE) ×2 IMPLANT
GOWN STRL REUS W/TWL LRG LVL3 (GOWN DISPOSABLE) ×6
HANDPIECE VERSAJET DEBRIDEMENT (MISCELLANEOUS) ×3 IMPLANT
KIT RM TURNOVER STRD PROC AR (KITS) ×3 IMPLANT
LABEL OR SOLS (LABEL) ×1 IMPLANT
NDL FILTER BLUNT 18X1 1/2 (NEEDLE) ×1 IMPLANT
NDL HYPO 25X1 1.5 SAFETY (NEEDLE) ×2 IMPLANT
NEEDLE FILTER BLUNT 18X 1/2SAF (NEEDLE) ×2
NEEDLE FILTER BLUNT 18X1 1/2 (NEEDLE) ×1 IMPLANT
NEEDLE HYPO 25X1 1.5 SAFETY (NEEDLE) ×6 IMPLANT
NS IRRIG 500ML POUR BTL (IV SOLUTION) ×3 IMPLANT
PACK EXTREMITY ARMC (MISCELLANEOUS) ×3 IMPLANT
SOL .9 NS 3000ML IRR  AL (IV SOLUTION)
SOL .9 NS 3000ML IRR AL (IV SOLUTION)
SOL .9 NS 3000ML IRR UROMATIC (IV SOLUTION) ×1 IMPLANT
SOL PREP PVP 2OZ (MISCELLANEOUS) ×3
SOLUTION PREP PVP 2OZ (MISCELLANEOUS) ×1 IMPLANT
STOCKINETTE STRL 6IN 960660 (GAUZE/BANDAGES/DRESSINGS) ×3 IMPLANT
STRIP CLOSURE SKIN 1/4X4 (GAUZE/BANDAGES/DRESSINGS) ×1 IMPLANT
SUT ETHILON 3-0 FS-10 30 BLK (SUTURE) ×3
SUT ETHILON 4-0 (SUTURE) ×6
SUT ETHILON 4-0 FS2 18XMFL BLK (SUTURE) ×2
SUT ETHILON 5-0 FS-2 18 BLK (SUTURE) ×3 IMPLANT
SUTURE EHLN 3-0 FS-10 30 BLK (SUTURE) IMPLANT
SUTURE ETHLN 4-0 FS2 18XMF BLK (SUTURE) ×2 IMPLANT
SWAB DUAL CULTURE TRANS RED ST (MISCELLANEOUS) ×3 IMPLANT
SYR 3ML LL SCALE MARK (SYRINGE) ×2 IMPLANT
SYRINGE 10CC LL (SYRINGE) ×3 IMPLANT

## 2015-08-21 NOTE — Progress Notes (Signed)
Hd completed 

## 2015-08-21 NOTE — Progress Notes (Signed)
[facility] [place] [facility]   PATIENT NAME: Devin Richmond    MR#:  [id number]  DATE OF BIRTH:  11/08/1965  SUBJECTIVE:  CHIEF COMPLAINT:   Chief Complaint  Patient presents with  . Open Wound  . Weakness  . Altered Mental Status    REVIEW OF SYSTEMS:  Review of Systems  Constitutional: Positive for malaise/fatigue. Negative for chills and fever.  HENT: Negative for ear discharge, ear pain and nosebleeds.   Eyes: Positive for blurred vision.       Legally blind in left eye  Respiratory: Negative for cough, shortness of breath and wheezing.   Cardiovascular: Negative for chest pain and palpitations.  Gastrointestinal: Negative for abdominal pain, constipation, diarrhea, nausea and vomiting.  Genitourinary: Negative for dysuria and urgency.  Musculoskeletal: Positive for joint pain and myalgias.  Neurological: Negative for dizziness, sensory change, speech change, focal weakness, seizures and headaches.  Psychiatric/Behavioral: Negative for depression.    DRUG ALLERGIES:   Allergies  Allergen Reactions  . Sulfa Antibiotics Other (See Comments)    unknown    VITALS:  Blood pressure 93/64, pulse 94, temperature 97.9 F (36.6 C), resp. rate 14, height 6' (1.829 m), weight 86.1 kg (189 lb 13.1 oz), SpO2 96 %.  PHYSICAL EXAMINATION:  Physical Exam  GENERAL:  50 y.o.-year-old patient lying in the bed with no acute distress.  EYES: Pupils equal, round, reactive to light and accommodation. No scleral icterus. Extraocular muscles intact.  HEENT: Head atraumatic, normocephalic. Oropharynx and nasopharynx clear.  NECK:  Supple, no jugular venous distention. No thyroid enlargement, no tenderness.  LUNGS: Normal breath sounds bilaterally, no wheezing, rales,rhonchi or crepitation. No use of accessory muscles of respiration. Decreased bibasilar breath sounds CARDIOVASCULAR: S1, S2 normal. No murmurs, rubs, or gallops.  ABDOMEN: Soft, nontender,  nondistended. Bowel sounds present. No organomegaly or mass.  EXTREMITIES: No pedal edema, cyanosis, or clubbing. Left foot big toe tip gangrenous, swollen upto the metatarsal area- in dressing today. Right forearm fistula NEUROLOGIC: Cranial nerves II through XII are intact. Following commands, moving all extremities.Sensation intact. Gait not checked.  PSYCHIATRIC: The patient is alert and oriented to himself. Slow to respond.Marland Kitchen.  SKIN: No obvious rash, lesion, or ulcer.    LABORATORY PANEL:   CBC  Recent Labs Lab 08/21/15 0455  WBC 17.3*  HGB 11.4*  HCT 34.7*  PLT 268   ------------------------------------------------------------------------------------------------------------------  Chemistries   Recent Labs Lab 08/18/15 0441  NA 139  K 5.0  CL 94*  CO2 31  GLUCOSE 256*  BUN 71*  CREATININE 13.51*  CALCIUM 8.0*   ------------------------------------------------------------------------------------------------------------------  Cardiac Enzymes No results for input(s): TROPONINI in the last 168 hours. ------------------------------------------------------------------------------------------------------------------  RADIOLOGY:  No results found.  EKG:   Orders placed or performed during the hospital encounter of 08/17/15  . ED EKG  . ED EKG    ASSESSMENT AND PLAN:   Devin Richmond  is a 50 y.o. male with a known history of End-stage renal disease on T-Th-S dialysis, neuropathy, stroke, peripheral vascular disease, diabetic retinopathy, hypertension, diabetes mellitus presents from group home secondary to worsening left great toe gangrene.                   #1 sepsis-secondary to left great toe gangrene with no osteomyelitis on X ray.  -Appreciate Podiatry and vascular consults. -Status post angiogram and angioplasty of posterior tibial artery done -Infectious disease consult appreciated. Blood cultures negative. - on Unasyn. - for surgery today for  left  first toe amputation  #2 hyperkalemia-dialysis today. Nephrology consulted  #3 end-stage renal disease on hemodialysis-on Tue-Thur-sat dialysis. Nephrology consulted Dialysis today after surgery  #4 diabetes mellitus-continue Levemir and pre-meal insulin and SSI  #5 DVT prophylaxis-subcutaneous heparin  Likely will need rehab   All the records are reviewed and case discussed with Care Management/Social Workerr. Management plans discussed with the patient, family and they are in agreement.  CODE STATUS: Full Code  TOTAL TIME TAKING CARE OF THIS PATIENT: 37 minutes.   POSSIBLE D/C IN 2-3 DAYS, DEPENDING ON CLINICAL CONDITION.   Enid BaasKALISETTI,Donoven Pett M.D on 08/21/2015 at 9:53 AM  Between 7am to 6pm - Pager - [phone]  After 6pm go to [email or web address] - Social research officer, governmentpassword EPAS [facility]  [facility]  Office  [phone]  CC: Primary care physician; Leotis ShamesSingh,Jasmine, MD

## 2015-08-21 NOTE — Interval H&P Note (Signed)
History and Physical Interval Note:  08/21/2015 7:58 AM   Devin Richmond  has presented today for surgery, with the diagnosis of gangrene left great toe   The various methods of treatment have been discussed with the patient and family. After consideration of risks, benefits and other options for treatment, the patient has consented to  Procedure(s): AMPUTATION TOE WITH FIRST RAY RESECTION (Left) as a surgical intervention .  The patient's history has been reviewed, patient examined, no change in status, stable for surgery.  I have reviewed the patient's chart and labs.  Questions were answered to the patient's satisfaction.     Ricci Barkerodd W Norell Brisbin

## 2015-08-21 NOTE — Anesthesia Preprocedure Evaluation (Signed)
Anesthesia Evaluation  Patient identified by MRN, date of birth, ID band Patient awake    Reviewed: Allergy & Precautions, NPO status , Patient's Chart, lab work & pertinent test results, reviewed documented beta blocker date and time   Airway Mallampati: II  TM Distance: >3 FB     Dental  (+) Chipped   Pulmonary           Cardiovascular hypertension, Pt. on medications and Pt. on home beta blockers      Neuro/Psych CVA    GI/Hepatic GERD  Controlled,  Endo/Other  diabetes, Type 2  Renal/GU ESRFRenal disease     Musculoskeletal   Abdominal   Peds  Hematology  (+) anemia ,   Anesthesia Other Findings   Reproductive/Obstetrics                             Anesthesia Physical Anesthesia Plan  ASA: III  Anesthesia Plan: General   Post-op Pain Management:    Induction: Intravenous  Airway Management Planned: LMA  Additional Equipment:   Intra-op Plan:   Post-operative Plan:   Informed Consent: I have reviewed the patients History and Physical, chart, labs and discussed the procedure including the risks, benefits and alternatives for the proposed anesthesia with the patient or authorized representative who has indicated his/her understanding and acceptance.     Plan Discussed with: CRNA  Anesthesia Plan Comments:         Anesthesia Quick Evaluation

## 2015-08-21 NOTE — Anesthesia Procedure Notes (Signed)
Procedure Name: LMA Insertion Date/Time: 08/21/2015 8:10 AM Performed by: Ginger CarneMICHELET, Eithen Castiglia Pre-anesthesia Checklist: Patient identified, Emergency Drugs available, Suction available, Patient being monitored and Timeout performed Patient Re-evaluated:Patient Re-evaluated prior to inductionOxygen Delivery Method: Circle system utilized Preoxygenation: Pre-oxygenation with 100% oxygen Intubation Type: IV induction LMA Size: 4.5 Tube type: Oral Placement Confirmation: positive ETCO2 and breath sounds checked- equal and bilateral Tube secured with: Tape Dental Injury: Teeth and Oropharynx as per pre-operative assessment

## 2015-08-21 NOTE — Transfer of Care (Signed)
Immediate Anesthesia Transfer of Care Note  Patient: Devin Richmond  Procedure(s) Performed: Procedure(s): AMPUTATION TOE WITH FIRST RAY RESECTION (Left)  Patient Location: PACU  Anesthesia Type:General  Level of Consciousness: sedated  Airway & Oxygen Therapy: Patient Spontanous Breathing and Patient connected to face mask oxygen  Post-op Assessment: Report given to RN and Post -op Vital signs reviewed and stable  Post vital signs: Reviewed and stable  Last Vitals:  Vitals:   08/21/15 0527 08/21/15 0902  BP: 130/75 (!) 146/113  Pulse: 96 97  Resp:  15  Temp: 36.8 C 36.6 C    Last Pain:  Vitals:   08/21/15 0527  TempSrc: Oral  PainSc:          Complications: No apparent anesthesia complications

## 2015-08-21 NOTE — Progress Notes (Signed)
HD STARTED  

## 2015-08-21 NOTE — Op Note (Signed)
Date of operation: 08/21/2015.  Surgeon: Ricci Barkerodd W Terriah Reggio DPM.  Preoperative diagnosis: Gangrene left great toe.  Postoperative diagnosis: Same.  Procedure: Amputation left great toe with partial first ray resection.  Anesthesia: LMA with local.  Hemostasis: None.  Estimated blood loss: Approximately 25 cc.  Pathology: Left first ray.  Drains: None.  Complications: None apparent.  Operative indications: This is a 50 year old male with recent admission with gangrene of his left great toe. Patient underwent revascularization a couple of days ago. Circulation has been optimized with this patient and decision was made to go ahead and proceed with amputation of the left great toe and first ray.  Operative procedure: Patient was taken the operating room and placed on the table in the supine position. Following satisfactory LMA anesthesia the left foot was prepped and draped in usual sterile fashion. Attention was then directed to the medial aspect of the foot where an elliptical type incision was made from medial to lateral coursing across the dorsal base of the great toe as well as plantarly across the forefoot ending in the first interspace at the base of the second toe. Incision was carried sharply down to the level of the bone and periosteal dissection carried back to the level of the mid first metatarsal shaft. Using a sagittal saw the first metatarsal was transected and the entire first ray was removed in toto. There was noted to be good healthy bleeding tissues with no signs of purulence. The wound was then flushed with copious amounts of sterile saline and closed using 3-0 nylon vertical mattress and simple interrupted sutures as well as 4-0 nylon simple interrupted sutures. 8 cc of 0.5% bupivacaine plain were then injected around the first ray for postoperative analgesia and the foot was cleaned off and then Xeroform 4 x 4's and ABDs and Kerlix followed by an Ace wrap applied to the foot.  Patient tolerated the procedure and anesthesia well and was transported to the PACU with vital signs stable and in good condition.

## 2015-08-21 NOTE — H&P (View-Only) (Signed)
Reason for Consult: Gangrene left great toe Referring Physician: Hospitalist's  Devin Richmond is an 50 y.o. male.  HPI: History of present illness cannot really be obtained by the patient today. He was seen in the office as an outpatient last month where some gangrenous changes were noted on the left great toe. He was referred for vascular evaluation and did have an interventional procedure. Apparently the toe has continued to progress with his gangrene and was admitted yesterday for treatment.  Past Medical History:  Diagnosis Date  . Anemia   . CVA (cerebral infarction)   . Diabetes mellitus without complication (Devin Richmond)   . Diabetic retinopathy (Devin Richmond)   . Dialysis patient (Devin Richmond)   . GERD (gastroesophageal reflux disease)   . Hyperlipidemia   . Hypertension   . Kidney disease   . Neuropathy (Devin Richmond)    peripheral  . Pancreatitis   . Stroke Devin Richmond)     Past Surgical History:  Procedure Laterality Date  . fistula placement left upper arm    . PERIPHERAL VASCULAR CATHETERIZATION Right 07/20/2014   Procedure: A/V Shuntogram/Fistulagram;  Surgeon: Devin Huxley, MD;  Location: [facility];  Service: Cardiovascular;  Laterality: Right;  . PERIPHERAL VASCULAR CATHETERIZATION N/A 07/20/2014   Procedure: A/V Shunt Intervention;  Surgeon: Devin Huxley, MD;  Location: [facility];  Service: Cardiovascular;  Laterality: N/A;  . PERIPHERAL VASCULAR CATHETERIZATION Left 07/12/2015   Procedure: Lower Extremity Angiography;  Surgeon: Devin Huxley, MD;  Location: [facility];  Service: Cardiovascular;  Laterality: Left;    Family History  Problem Relation Age of Onset  . Stroke Father     Social History:  reports that he has never smoked. He has never used smokeless tobacco. He reports that he does not drink alcohol or use drugs.  Allergies:  Allergies  Allergen Reactions  . Sulfa Antibiotics Other (See Comments)    unknown    Medications:  Scheduled: .  atorvastatin  80 mg Oral QHS  . atropine  1 drop Left Eye BID  . carvedilol  12.5 mg Oral BID WC  . cinacalcet  90 mg Oral Q breakfast  . famotidine  20 mg Oral Daily  . folic acid  614 mcg Oral Daily  . heparin  5,000 Units Subcutaneous Q8H  . insulin aspart  0-15 Units Subcutaneous TID WC  . insulin aspart  0-5 Units Subcutaneous QHS  . insulin detemir  10 Units Subcutaneous QHS  . lanthanum  1,000 mg Oral TID WC  . loratadine  10 mg Oral Daily  . meclizine  25 mg Oral BID  . mirtazapine  30 mg Oral QHS  . piperacillin-tazobactam (ZOSYN)  IV  3.375 g Intravenous Q12H  . sevelamer carbonate  800 mg Oral TID WC  . vancomycin  500 mg Intravenous Once  . [START ON 08/19/2015] vancomycin  1,000 mg Intravenous Q T,Th,Sa-HD    Results for orders placed or performed during the hospital encounter of 08/17/15 (from the past 48 hour(s))  Glucose, capillary     Status: Abnormal   Collection Time: 08/17/15  4:34 PM  Result Value Ref Range   Glucose-Capillary 335 (H) 65 - 99 mg/dL  Basic metabolic panel     Status: Abnormal   Collection Time: 08/17/15  6:04 PM  Result Value Ref Range   Sodium 136 135 - 145 mmol/L   Potassium 5.8 (H) 3.5 - 5.1 mmol/L   Chloride 94 (L) 101 - 111 mmol/L  CO2 25 22 - 32 mmol/L   Glucose, Bld 316 (H) 65 - 99 mg/dL   BUN 89 (H) 6 - 20 mg/dL    Comment: RESULT CONFIRMED BY MANUAL DILUTION JUW    Creatinine, Ser 18.25 (H) 0.61 - 1.24 mg/dL   Calcium 7.9 (L) 8.9 - 10.3 mg/dL   GFR calc non Af Amer 3 (L) >60 mL/min   GFR calc Af Amer 3 (L) >60 mL/min    Comment: (NOTE) The eGFR has been calculated using the CKD EPI equation. This calculation has not been validated in all clinical situations. eGFR's persistently <60 mL/min signify possible Chronic Kidney Disease.    Anion gap 17 (H) 5 - 15  CBC     Status: Abnormal   Collection Time: 08/17/15  6:04 PM  Result Value Ref Range   WBC 21.6 (H) 3.8 - 10.6 K/uL   RBC 3.85 (L) 4.40 - 5.90 MIL/uL   Hemoglobin  12.1 (L) 13.0 - 18.0 g/dL   HCT 37.1 (L) 40.0 - 52.0 %   MCV 96.2 80.0 - 100.0 fL   MCH 31.5 26.0 - 34.0 pg   MCHC 32.7 32.0 - 36.0 g/dL   RDW 16.3 (H) 11.5 - 14.5 %   Platelets 227 150 - 440 K/uL  Beta-hydroxybutyric acid     Status: Abnormal   Collection Time: 08/17/15  6:04 PM  Result Value Ref Range   Beta-Hydroxybutyric Acid 0.40 (H) 0.05 - 0.27 mmol/L  Glucose, capillary     Status: Abnormal   Collection Time: 08/17/15  8:01 PM  Result Value Ref Range   Glucose-Capillary 383 (H) 65 - 99 mg/dL  Glucose, capillary     Status: Abnormal   Collection Time: 08/18/15 12:50 AM  Result Value Ref Range   Glucose-Capillary 219 (H) 65 - 99 mg/dL   Comment 1 Notify RN   MRSA PCR Screening     Status: None   Collection Time: 08/18/15  3:00 AM  Result Value Ref Range   MRSA by PCR NEGATIVE NEGATIVE    Comment:        The GeneXpert MRSA Assay (FDA approved for NASAL specimens only), is one component of a comprehensive MRSA colonization surveillance program. It is not intended to diagnose MRSA infection nor to guide or monitor treatment for MRSA infections.   Basic metabolic panel     Status: Abnormal   Collection Time: 08/18/15  4:41 AM  Result Value Ref Range   Sodium 139 135 - 145 mmol/L   Potassium 5.0 3.5 - 5.1 mmol/L   Chloride 94 (L) 101 - 111 mmol/L   CO2 31 22 - 32 mmol/L   Glucose, Bld 256 (H) 65 - 99 mg/dL   BUN 71 (H) 6 - 20 mg/dL   Creatinine, Ser 13.51 (H) 0.61 - 1.24 mg/dL   Calcium 8.0 (L) 8.9 - 10.3 mg/dL   GFR calc non Af Amer 4 (L) >60 mL/min   GFR calc Af Amer 4 (L) >60 mL/min    Comment: (NOTE) The eGFR has been calculated using the CKD EPI equation. This calculation has not been validated in all clinical situations. eGFR's persistently <60 mL/min signify possible Chronic Kidney Disease.    Anion gap 14 5 - 15  CBC     Status: Abnormal   Collection Time: 08/18/15  4:41 AM  Result Value Ref Range   WBC 22.6 (H) 3.8 - 10.6 K/uL   RBC 3.73 (L) 4.40 -  5.90 MIL/uL   Hemoglobin 11.7 (  L) 13.0 - 18.0 g/dL   HCT 35.6 (L) 40.0 - 52.0 %   MCV 95.3 80.0 - 100.0 fL   MCH 31.2 26.0 - 34.0 pg   MCHC 32.8 32.0 - 36.0 g/dL   RDW 17.0 (H) 11.5 - 14.5 %   Platelets 239 150 - 440 K/uL  Glucose, capillary     Status: Abnormal   Collection Time: 08/18/15  8:11 AM  Result Value Ref Range   Glucose-Capillary 246 (H) 65 - 99 mg/dL    Dg Chest Portable 1 View  Result Date: 08/17/2015 CLINICAL DATA:  Cough.  Weakness since Sunday. EXAM: PORTABLE CHEST 1 VIEW COMPARISON:  06/24/2015 FINDINGS: Midline trachea.  Normal heart size and mediastinal contours. Sharp costophrenic angles.  No pneumothorax.  Clear lungs. Patient rotated minimally right. IMPRESSION: No active disease. Electronically Signed   By: Abigail Miyamoto M.D.   On: 08/17/2015 19:15   Dg Foot Complete Left  Result Date: 08/17/2015 CLINICAL DATA:  Ischemic great toe. Left great toe wound for 2 months. EXAM: LEFT FOOT - COMPLETE 3+ VIEW COMPARISON:  None. FINDINGS: There is soft tissue ulceration involving the great toe at the level of the distal phalanx with heterogeneous lucency compatible with soft tissue emphysema. No definite underlying osseous erosion is identified to indicate osteomyelitis. No acute fracture or dislocation is identified. There are dorsiflexion deformities at the second through fifth MCP joints. Diffuse atherosclerotic vascular calcification is present. There is diffuse soft tissue swelling about the forefoot. IMPRESSION: Soft tissue ulceration and soft tissue emphysema involving the left great toe. No definite radiographic evidence of osteomyelitis. Electronically Signed   By: Logan Bores M.D.   On: 08/17/2015 17:57    Review of Systems  Unable to perform ROS: Mental acuity (Unresponsive to questions)   Blood pressure 120/84, pulse 90, temperature 98.2 F (36.8 C), temperature source Oral, resp. rate 20, height 6' (1.829 m), weight 86.1 kg (189 lb 13.1 oz), SpO2 98 %. Physical  Exam  Cardiovascular:  DP pulse is thready bilateral. PT pulse is thready and not clearly palpable.  Musculoskeletal:  Stiff range of motion in the pedal joints. Muscle testing is deferred.  Neurological:  Loss of protective threshold monofilament wire distally in the feet and toes. Proprioception is impaired  Skin:  Skin is then dry and atrophic. Some discolorations in the left great toe with dry gangrenous changes at the distal aspect of the left hallux. No significant drainage is noted.    Assessment/Plan: Assessment: 1. Peripheral vascular disease with gangrene left great toe. 2. Diabetes with associated neuropathy.   Plan: Patient will need some level of amputation. We will wait to see what vascular surgery's recommendations are as far as his healing potential and level of amputation. Attempted to discuss this with the patient but he did not really seem to have any clear understanding. We will follow the patient accordingly.  Durward Fortes 08/18/2015, 8:20 AM

## 2015-08-21 NOTE — Progress Notes (Signed)
[facility]  ROUNDING NOTE   Subjective:  Patient s/p amputation of Left 1st toe.  Feeling well at the moment. Due for dialysis today.   Objective:  Vital signs in last 24 hours:  Temp:  [97.5 F (36.4 C)-98.2 F (36.8 C)] 97.5 F (36.4 C) (08/19 1038) Pulse Rate:  [84-98] 93 (08/19 1038) Resp:  [12-21] 21 (08/19 0956) BP: (82-146)/(50-113) 100/66 (08/19 1038) SpO2:  [95 %-100 %] 95 % (08/19 1038)  Weight change:  Filed Weights   08/17/15 1627 08/17/15 2050  Weight: 86.2 kg (190 lb) 86.1 kg (189 lb 13.1 oz)    Intake/Output: I/O last 3 completed shifts: In: 340 [P.O.:240; IV Piggyback:100] Out: 0    Intake/Output this shift:  Total I/O In: 100 [I.V.:100] Out: 25 [Blood:25]  Physical Exam: General: No acute distress  Head: Normocephalic, atraumatic. Moist oral mucosal membranes  Eyes: Anicteric  Neck: Supple, trachea midline  Lungs:  Clear to auscultation, normal effort  Heart: S1S2 no rubs  Abdomen:  Soft, nontender, BS present  Extremities: trace peripheral edema, left foot wrapped  Neurologic: Nonfocal, moving all four extremities  Skin: No lesions  Access: Left upper extremity AV fistula     Basic Metabolic Panel:  Recent Labs Lab 08/17/15 1804 08/18/15 0441  NA 136 139  K 5.8* 5.0  CL 94* 94*  CO2 25 31  GLUCOSE 316* 256*  BUN 89* 71*  CREATININE 18.25* 13.51*  CALCIUM 7.9* 8.0*    Liver Function Tests: No results for input(s): AST, ALT, ALKPHOS, BILITOT, PROT, ALBUMIN in the last 168 hours. No results for input(s): LIPASE, AMYLASE in the last 168 hours. No results for input(s): AMMONIA in the last 168 hours.  CBC:  Recent Labs Lab 08/17/15 1804 08/18/15 0441 08/19/15 0441 08/20/15 0550 08/21/15 0455  WBC 21.6* 22.6* 21.5* 19.8* 17.3*  HGB 12.1* 11.7* 11.7* 11.7* 11.4*  HCT 37.1* 35.6* 35.3* 35.3* 34.7*  MCV 96.2 95.3 95.1 95.5 96.8  PLT 227 239 239 258 268    Cardiac Enzymes: No results for input(s): CKTOTAL,  CKMB, CKMBINDEX, TROPONINI in the last 168 hours.  BNP: Invalid input(s): POCBNP  CBG:  Recent Labs Lab 08/20/15 0749 08/20/15 1141 08/20/15 1649 08/20/15 2150 08/21/15 0924  GLUCAP 140* 113* 100* 97 111*    Microbiology: Results for orders placed or performed during the hospital encounter of 08/17/15  Culture, blood (Routine X 2) w Reflex to ID Panel     Status: None (Preliminary result)   Collection Time: 08/17/15  6:10 PM  Result Value Ref Range Status   Specimen Description BLOOD  Final   Special Requests 4ML  Final   Culture NO GROWTH 4 DAYS  Final   Report Status PENDING  Incomplete  Culture, blood (Routine X 2) w Reflex to ID Panel     Status: None (Preliminary result)   Collection Time: 08/17/15  6:40 PM  Result Value Ref Range Status   Specimen Description BLOOD  Final   Special Requests 4ML  Final   Culture NO GROWTH 4 DAYS  Final   Report Status PENDING  Incomplete  MRSA PCR Screening     Status: None   Collection Time: 08/18/15  3:00 AM  Result Value Ref Range Status   MRSA by PCR NEGATIVE NEGATIVE Final    Comment:        The GeneXpert MRSA Assay (FDA approved for NASAL specimens only), is one component of a comprehensive MRSA colonization surveillance program. It is not intended  to diagnose MRSA infection nor to guide or monitor treatment for MRSA infections.     Coagulation Studies: No results for input(s): LABPROT, INR in the last 72 hours.  Urinalysis: No results for input(s): COLORURINE, LABSPEC, PHURINE, GLUCOSEU, HGBUR, BILIRUBINUR, KETONESUR, PROTEINUR, UROBILINOGEN, NITRITE, LEUKOCYTESUR in the last 72 hours.  Invalid input(s): APPERANCEUR    Imaging: No results found.   Medications:     . ampicillin-sulbactam (UNASYN) IV  3 g Intravenous Q24H  . atorvastatin  80 mg Oral QHS  . atropine  1 drop Left Eye BID  . carvedilol  12.5 mg Oral BID WC  . chlorproMAZINE  25 mg Oral TID  . cinacalcet  90 mg Oral Q breakfast  .  famotidine  20 mg Oral Daily  . folic acid  500 mcg Oral Daily  . heparin  5,000 Units Subcutaneous Q8H  . insulin aspart  0-15 Units Subcutaneous TID WC  . insulin aspart  0-5 Units Subcutaneous QHS  . insulin detemir  5 Units Subcutaneous QHS  . lanthanum  1,000 mg Oral TID WC  . loratadine  10 mg Oral Daily  . meclizine  25 mg Oral BID  . mirtazapine  30 mg Oral QHS  . sevelamer carbonate  0.8 g Oral TID WC     Assessment/ Plan:  50 y.o. 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 End-stage renal disease TTS: Pt due for HD today, orders have been prepared, UF target 2kg.   2. Hyperkalemia.  Resolved with HD previously.   3. Secondary hyperparathyroidism.  Check phosphorus today. Continue Renvela, Fosrenol, and Sensipar.  4. Anemia of chronic Richmond disease. Hemoglobin slightly lower at 11.4, epogen on hold.   5.  Gangrene of left first toe/peripheral vascular disease. Status post angiogram 08/19/2015 with intervention.   S/p amputation left great toe with partial first ray resection 08/21/15  LOS: 4 Devin [place] 8/19/201711:03 AM

## 2015-08-21 NOTE — Progress Notes (Signed)
PRE DIALYSIS ASSESSMENT 

## 2015-08-21 NOTE — Anesthesia Postprocedure Evaluation (Signed)
Anesthesia Post Note  Patient: Devin Richmond  Procedure(s) Performed: Procedure(s) (LRB): AMPUTATION TOE WITH FIRST RAY RESECTION (Left)  Patient location during evaluation: PACU Anesthesia Type: General Level of consciousness: awake and alert Pain management: pain level controlled Vital Signs Assessment: post-procedure vital signs reviewed and stable Respiratory status: spontaneous breathing, nonlabored ventilation, respiratory function stable and patient connected to nasal cannula oxygen Cardiovascular status: blood pressure returned to baseline and stable Postop Assessment: no signs of nausea or vomiting Anesthetic complications: no    Last Vitals:  Vitals:   08/21/15 1525 08/21/15 1530  BP: 102/66 95/62  Pulse: 91 93  Resp: (!) 24 16  Temp: 36.6 C     Last Pain:  Vitals:   08/21/15 1525  TempSrc: Axillary  PainSc: 0-No pain                 Lanell Carpenter S

## 2015-08-21 NOTE — Progress Notes (Signed)
Post dialysis assessment 

## 2015-08-22 LAB — GLUCOSE, CAPILLARY
GLUCOSE-CAPILLARY: 180 mg/dL — AB (ref 65–99)
GLUCOSE-CAPILLARY: 130 mg/dL — AB (ref 65–99)
GLUCOSE-CAPILLARY: 157 mg/dL — AB (ref 65–99)
Glucose-Capillary: 185 mg/dL — ABNORMAL HIGH (ref 65–99)

## 2015-08-22 LAB — CBC
HEMATOCRIT: 31.6 % — AB (ref 40.0–52.0)
HEMOGLOBIN: 10.6 g/dL — AB (ref 13.0–18.0)
MCH: 32 pg (ref 26.0–34.0)
MCHC: 33.5 g/dL (ref 32.0–36.0)
MCV: 95.6 fL (ref 80.0–100.0)
Platelets: 286 10*3/uL (ref 150–440)
RBC: 3.31 MIL/uL — ABNORMAL LOW (ref 4.40–5.90)
RDW: 16.6 % — AB (ref 11.5–14.5)
WBC: 12.3 10*3/uL — AB (ref 3.8–10.6)

## 2015-08-22 LAB — CULTURE, BLOOD (ROUTINE X 2)
Culture: NO GROWTH
Culture: NO GROWTH

## 2015-08-22 LAB — BASIC METABOLIC PANEL
ANION GAP: 12 (ref 5–15)
BUN: 37 mg/dL — ABNORMAL HIGH (ref 6–20)
CALCIUM: 7.7 mg/dL — AB (ref 8.9–10.3)
CHLORIDE: 94 mmol/L — AB (ref 101–111)
CO2: 30 mmol/L (ref 22–32)
Creatinine, Ser: 8.59 mg/dL — ABNORMAL HIGH (ref 0.61–1.24)
GFR calc Af Amer: 7 mL/min — ABNORMAL LOW (ref >60)
GFR calc non Af Amer: 6 mL/min — ABNORMAL LOW (ref >60)
GLUCOSE: 191 mg/dL — AB (ref 65–99)
Potassium: 4.1 mmol/L (ref 3.5–5.1)
Sodium: 136 mmol/L (ref 135–145)

## 2015-08-22 MED ORDER — ESCITALOPRAM OXALATE 10 MG PO TABS: 5.0000 mg | ORAL_TABLET | Freq: Every day | ORAL | Status: DC

## 2015-08-22 MED ORDER — CHLORPROMAZINE HCL 10 MG PO TABS
10.0000 mg | ORAL_TABLET | Freq: Three times a day (TID) | ORAL | Status: DC | PRN
  Administered 2015-08-22 – 2015-08-25 (×7): 10 mg via ORAL
  Filled 2015-08-22 (×8): qty 1

## 2015-08-22 MED ORDER — ESCITALOPRAM OXALATE 10 MG PO TABS
10.0000 mg | ORAL_TABLET | Freq: Every day | ORAL | Status: DC
  Administered 2015-08-22 – 2015-08-25 (×4): 10 mg via ORAL
  Filled 2015-08-22 (×4): qty 1

## 2015-08-22 NOTE — NC FL2 (Signed)
[place] MEDICAID FL2 LEVEL OF CARE SCREENING TOOL     IDENTIFICATION  Patient Name: Devin [place] Birthdate: Apr 10, 1965 Sex: male Admission Date (Current Location): 08/17/2015  [place]   Facility and Address:  [facility], [address], [place], [place] [address]      Provider Number: [id number]  Attending Physician Name and Address:  Enid Baasadhika Kalisetti, MD  Relative Name and Phone Number:       Current Level of Care: Hospital Recommended Level of Care: Skilled Nursing Facility Prior Approval Number:    Date Approved/Denied:   PASRR Number:    Discharge Plan: SNF    Current Diagnoses: Patient Active Problem List   Diagnosis Date Noted  . 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 Sinus tachycardia ([name]) 08/19/2014    Orientation RESPIRATION BLADDER Height & Weight     Self, Time, Situation, Place  Normal Continent Weight: 221 lb 5.5 oz (100.4 kg) Height:  6' (182.9 cm)  BEHAVIORAL SYMPTOMS/MOOD NEUROLOGICAL BOWEL NUTRITION STATUS   (none)  (none) Continent Diet (carb modified)  AMBULATORY STATUS COMMUNICATION OF NEEDS Skin   Extensive Assist Verbally Normal                       Personal Care Assistance Level of Assistance  Total care Bathing Assistance: Limited assistance   Dressing Assistance: Limited assistance Total Care Assistance: Limited assistance   Functional Limitations Info             SPECIAL CARE FACTORS FREQUENCY  PT (By licensed PT)     PT Frequency: 3x day 5x week              Contractures Contractures Info: Present    Additional Factors Info  Allergies Code Status Info: FULL Allergies Info: Sulfa Antibiotics           Current Medications (08/22/2015):  This is the current hospital active medication list Current Facility-Administered Medications  Medication Dose Route Frequency Provider Last Rate Last Dose  . acetaminophen (TYLENOL) tablet 650 mg  650 mg Oral  Q6H PRN Enid Baasadhika Kalisetti, MD   650 mg at 08/19/15 2144   Or  . acetaminophen (TYLENOL) suppository 650 mg  650 mg Rectal Q6H PRN Enid Baasadhika Kalisetti, MD      . Ampicillin-Sulbactam (UNASYN) 3 g in sodium chloride 0.9 % 100 mL IVPB  3 g Intravenous Q24H Olene FlossMelissa D Maccia, RPH   3 g at 08/21/15 2237  . atorvastatin (LIPITOR) tablet 80 mg  80 mg Oral QHS Enid Baasadhika Kalisetti, MD   80 mg at 08/20/15 2105  . atropine 1 % ophthalmic solution 1 drop  1 drop Left Eye BID Enid Baasadhika Kalisetti, MD   1 drop at 08/22/15 0902  . carvedilol (COREG) tablet 12.5 mg  12.5 mg Oral BID WC Enid Baasadhika Kalisetti, MD   12.5 mg at 08/21/15 0657  . chlorproMAZINE (THORAZINE) tablet 10 mg  10 mg Oral TID PRN Ihor AustinPavan Pyreddy, MD   10 mg at 08/22/15 0901  . cinacalcet (SENSIPAR) tablet 90 mg  90 mg Oral Q breakfast Enid Baasadhika Kalisetti, MD   90 mg at 08/22/15 0858  . escitalopram (LEXAPRO) tablet 10 mg  10 mg Oral Daily Enid Baasadhika Kalisetti, MD   10 mg at 08/22/15 1156  . famotidine (PEPCID) tablet 20 mg  20 mg Oral Daily Enid Baasadhika Kalisetti, MD   20 mg at 08/22/15 0858  . folic acid (FOLVITE) tablet 0.5 mg  500 mcg Oral Daily Radhika  Kalisetti, MD   0.5 mg at 08/22/15 0859  . heparin injection 5,000 Units  5,000 Units Subcutaneous Q8H Enid Baasadhika Kalisetti, MD   5,000 Units at 08/22/15 1419  . insulin aspart (novoLOG) injection 0-15 Units  0-15 Units Subcutaneous TID WC Adrian SaranSital Mody, MD   3 Units at 08/22/15 1150  . insulin aspart (novoLOG) injection 0-5 Units  0-5 Units Subcutaneous QHS Sital Mody, MD      . insulin detemir (LEVEMIR) injection 5 Units  5 Units Subcutaneous QHS Adrian SaranSital Mody, MD   5 Units at 08/21/15 2239  . lanthanum (FOSRENOL) chewable tablet 1,000 mg  1,000 mg Oral TID WC Enid Baasadhika Kalisetti, MD   Stopped at 08/21/15 1700  . lidocaine-prilocaine (EMLA) cream 1 application  1 application Topical Daily PRN Enid Baasadhika Kalisetti, MD      . loratadine (CLARITIN) tablet 10 mg  10 mg Oral Daily Enid Baasadhika Kalisetti, MD   10 mg at 08/22/15 0859  .  meclizine (ANTIVERT) tablet 25 mg  25 mg Oral BID Enid Baasadhika Kalisetti, MD   25 mg at 08/22/15 0859  . mirtazapine (REMERON) tablet 30 mg  30 mg Oral QHS Enid Baasadhika Kalisetti, MD   30 mg at 08/20/15 2105  . morphine 2 MG/ML injection 2 mg  2 mg Intravenous Q2H PRN Linus Galasodd Cline, DPM      . ondansetron [facility](ZOFRAN) tablet 4 mg  4 mg Oral Q6H PRN Enid Baasadhika Kalisetti, MD       Or  . ondansetron (ZOFRAN) injection 4 mg  4 mg Intravenous Q6H PRN Enid Baasadhika Kalisetti, MD   4 mg at 08/21/15 2316  . oxyCODONE-acetaminophen (PERCOCET/ROXICET) 5-325 MG per tablet 1-2 tablet  1-2 tablet Oral Q4H PRN Linus Galasodd Cline, DPM      . sevelamer carbonate (RENVELA) packet 0.8 g  0.8 g Oral TID WC Melissa D Maccia, RPH   0.8 g at 08/21/15 1209     Discharge Medications: Please see discharge summary for a list of discharge medications.  Relevant Imaging Results:  Relevant Lab Results:   Additional Information  SS#[id number]  Judi CongKaren M Breyer Tejera, LCSW

## 2015-08-22 NOTE — Progress Notes (Signed)
[facility]  ROUNDING NOTE   Subjective:  Patient postoperative day one left first toe amputation with partial ray amputation. Denies being in pain at the moment. Having hiccups almost continuously. Otherwise resting comfortably.   Objective:  Vital signs in last 24 hours:  Temp:  [97.8 F (36.6 C)-98.9 F (37.2 C)] 98.5 F (36.9 C) (08/20 0406) Pulse Rate:  [89-104] 102 (08/20 0756) Resp:  [16-24] 20 (08/20 0406) BP: (92-129)/(52-88) 108/52 (08/20 0756) SpO2:  [97 %-100 %] 97 % (08/20 0756) Weight:  [100.4 kg (221 lb 5.5 oz)-101.8 kg (224 lb 6.9 oz)] 100.4 kg (221 lb 5.5 oz) (08/19 1907)  Weight change:  Filed Weights   08/17/15 2050 08/21/15 1525 08/21/15 1907  Weight: 86.1 kg (189 lb 13.1 oz) 101.8 kg (224 lb 6.9 oz) 100.4 kg (221 lb 5.5 oz)    Intake/Output: I/O last 3 completed shifts: In: 100 [I.V.:100] Out: 525 [Other:500; Blood:25]   Intake/Output this shift:  No intake/output data recorded.  Physical Exam: General: No acute distress  Head: Normocephalic, atraumatic. Moist oral mucosal membranes  Eyes: Anicteric  Neck: Supple, trachea midline  Lungs:  Clear to auscultation, normal effort  Heart: S1S2 no rubs  Abdomen:  Soft, nontender, BS present  Extremities: trace peripheral edema, left foot wrapped  Neurologic: Nonfocal, moving all four extremities  Skin: No lesions  Access: Left upper extremity AV fistula     Basic Metabolic Panel:  Recent Labs Lab 08/17/15 1804 08/18/15 0441 08/22/15 0526  NA 136 139 136  K 5.8* 5.0 4.1  CL 94* 94* 94*  CO2 25 31 30   GLUCOSE 316* 256* 191*  BUN 89* 71* 37*  CREATININE 18.25* 13.51* 8.59*  CALCIUM 7.9* 8.0* 7.7*    Liver Function Tests: No results for input(s): AST, ALT, ALKPHOS, BILITOT, PROT, ALBUMIN in the last 168 hours. No results for input(s): LIPASE, AMYLASE in the last 168 hours. No results for input(s): AMMONIA in the last 168 hours.  CBC:  Recent Labs Lab 08/18/15 0441  08/19/15 0441 08/20/15 0550 08/21/15 0455 08/22/15 0526  WBC 22.6* 21.5* 19.8* 17.3* 12.3*  HGB 11.7* 11.7* 11.7* 11.4* 10.6*  HCT 35.6* 35.3* 35.3* 34.7* 31.6*  MCV 95.3 95.1 95.5 96.8 95.6  PLT 239 239 258 268 286    Cardiac Enzymes: No results for input(s): CKTOTAL, CKMB, CKMBINDEX, TROPONINI in the last 168 hours.  BNP: Invalid input(s): POCBNP  CBG:  Recent Labs Lab 08/21/15 0924 08/21/15 1140 08/21/15 2024 08/22/15 0741 08/22/15 1125  GLUCAP 111* 132* 114* 185* 180*    Microbiology: Results for orders placed or performed during the hospital encounter of 08/17/15  Culture, blood (Routine X 2) w Reflex to ID Panel     Status: None (Preliminary result)   Collection Time: 08/17/15  6:10 PM  Result Value Ref Range Status   Specimen Description BLOOD  Final   Special Requests 4ML  Final   Culture NO GROWTH 4 DAYS  Final   Report Status PENDING  Incomplete  Culture, blood (Routine X 2) w Reflex to ID Panel     Status: None (Preliminary result)   Collection Time: 08/17/15  6:40 PM  Result Value Ref Range Status   Specimen Description BLOOD  Final   Special Requests 4ML  Final   Culture NO GROWTH 4 DAYS  Final   Report Status PENDING  Incomplete  MRSA PCR Screening     Status: None   Collection Time: 08/18/15  3:00 AM  Result Value Ref  Range Status   MRSA by PCR NEGATIVE NEGATIVE Final    Comment:        The GeneXpert MRSA Assay (FDA approved for NASAL specimens only), is one component of a comprehensive MRSA colonization surveillance program. It is not intended to diagnose MRSA infection nor to guide or monitor treatment for MRSA infections.     Coagulation Studies: No results for input(s): LABPROT, INR in the last 72 hours.  Urinalysis: No results for input(s): COLORURINE, LABSPEC, PHURINE, GLUCOSEU, HGBUR, BILIRUBINUR, KETONESUR, PROTEINUR, UROBILINOGEN, NITRITE, LEUKOCYTESUR in the last 72 hours.  Invalid input(s): APPERANCEUR    Imaging: No  results found.   Medications:     . ampicillin-sulbactam (UNASYN) IV  3 g Intravenous Q24H  . atorvastatin  80 mg Oral QHS  . atropine  1 drop Left Eye BID  . carvedilol  12.5 mg Oral BID WC  . cinacalcet  90 mg Oral Q breakfast  . escitalopram  10 mg Oral Daily  . famotidine  20 mg Oral Daily  . folic acid  500 mcg Oral Daily  . heparin  5,000 Units Subcutaneous Q8H  . insulin aspart  0-15 Units Subcutaneous TID WC  . insulin aspart  0-5 Units Subcutaneous QHS  . insulin detemir  5 Units Subcutaneous QHS  . lanthanum  1,000 mg Oral TID WC  . loratadine  10 mg Oral Daily  . meclizine  25 mg Oral BID  . mirtazapine  30 mg Oral QHS  . sevelamer carbonate  0.8 g Oral TID WC     Assessment/ Plan:  50 y.o. male with past medical history of end-stage renal disease on hemodialysis TTS, diabetes will start 2, hypertension, history of CVA, anemia of chronic kidney disease, secondary hyperparathyroidism, resides at assisted living facility, peripheral vascular disease, necrotic left first toe, S/p amputation left great toe with partial first ray resection 08/21/15  [facility]/TTS  1. End-stage renal disease TTS: Patient completed hemodialysis yesterday. No acute indication for dialysis today. We will plan for dialysis again on Tuesday if still here.   2. Hyperkalemia.  Potassium normal yesterday at 4.1. Continue to periodically monitor.  3. Secondary hyperparathyroidism.  Phosphorus not drawn yesterday. Continue Renvela, Fosrenol, and Sensipar.  4. Anemia of chronic kidney disease. Hemoglobin down to 10.6. Epogen had been on hold however recommend restarting Epogen with dialysis on Tuesday if still here.   5.  Gangrene of left first toe/peripheral vascular disease. Status post angiogram 08/19/2015 with intervention.   S/p amputation left great toe with partial first ray resection 08/21/15  LOS: 5 Ruvim Risko 8/20/201711:29 AM

## 2015-08-22 NOTE — Progress Notes (Signed)
[facility] [place] [facility]   PATIENT NAME: Devin [place]    MR#:  [id number]  DATE OF BIRTH:  [birth year]  SUBJECTIVE:  CHIEF COMPLAINT:   Chief Complaint  Patient presents with  . Open Wound  . Weakness  . Altered Mental Status   - having hiccups, refusing meds at times -also appears depressed  REVIEW OF SYSTEMS:  Review of Systems  Constitutional: Positive for malaise/fatigue. Negative for chills and fever.  HENT: Negative for ear discharge, ear pain and nosebleeds.   Eyes: Positive for blurred vision.       Legally blind in left eye  Respiratory: Negative for cough, shortness of breath and wheezing.   Cardiovascular: Negative for chest pain and palpitations.  Gastrointestinal: Negative for abdominal pain, constipation, diarrhea, nausea and vomiting.  Genitourinary: Negative for dysuria and urgency.  Musculoskeletal: Positive for joint pain and myalgias.  Neurological: Negative for dizziness, sensory change, speech change, focal weakness, seizures and headaches.  Psychiatric/Behavioral: Positive for depression.    DRUG ALLERGIES:   Allergies  Allergen Reactions  . Sulfa Antibiotics Other (See Comments)    unknown    VITALS:  Blood pressure (!) 108/52, pulse (!) 102, temperature 98.5 F (36.9 C), temperature source Oral, resp. rate 20, height 6' (1.829 m), weight 100.4 kg (221 lb 5.5 oz), SpO2 97 %.  PHYSICAL EXAMINATION:  Physical Exam  GENERAL:  50 y.o.-year-old patient lying in the bed with no acute distress. Not very interactive. Legally blind in left eye- also increased erythema and secretions today EYES: Pupils equal, round, reactive to light and accommodation. No scleral icterus. Extraocular muscles intact.  HEENT: Head atraumatic, normocephalic. Oropharynx and nasopharynx clear.  NECK:  Supple, no jugular venous distention. No thyroid enlargement, no tenderness.  LUNGS: Normal breath sounds bilaterally, no wheezing, rales,rhonchi  or crepitation. No use of accessory muscles of respiration. Decreased bibasilar breath sounds CARDIOVASCULAR: S1, S2 normal. No murmurs, rubs, or gallops.  ABDOMEN: Soft, nontender, nondistended. Bowel sounds present. No organomegaly or mass.  EXTREMITIES: No pedal edema, cyanosis, or clubbing. Left foot big toe tip gangrenous, swollen upto the metatarsal area- in dressing. Right forearm fistula NEUROLOGIC: Cranial nerves II through XII are intact. Following commands, moving all extremities.Sensation intact. Gait not checked.  PSYCHIATRIC: The patient is alert and oriented to himself. Slow to respond.Marland Kitchen.  SKIN: No obvious rash, lesion, or ulcer.    LABORATORY PANEL:   CBC  Recent Labs Lab 08/22/15 0526  WBC 12.3*  HGB 10.6*  HCT 31.6*  PLT 286   ------------------------------------------------------------------------------------------------------------------  Chemistries   Recent Labs Lab 08/22/15 0526  NA 136  K 4.1  CL 94*  CO2 30  GLUCOSE 191*  BUN 37*  CREATININE 8.59*  CALCIUM 7.7*   ------------------------------------------------------------------------------------------------------------------  Cardiac Enzymes No results for input(s): TROPONINI in the last 168 hours. ------------------------------------------------------------------------------------------------------------------  RADIOLOGY:  No results found.  EKG:   Orders placed or performed during the hospital encounter of 08/17/15  . ED EKG  . ED EKG    ASSESSMENT AND PLAN:   Devin Brassric Doughtie  is a 50 y.o. male with a known history of End-stage renal disease on T-Th-S dialysis, neuropathy, stroke, peripheral vascular disease, diabetic retinopathy, hypertension, diabetes mellitus presents from group home secondary to worsening left great toe gangrene.                  #1 sepsis-secondary to left great toe gangrene with no osteomyelitis on X ray.  -Appreciate Podiatry and vascular  consults. -Status post  angiogram and angioplasty of posterior tibial artery done - and left first toe amputation and first metatarsal ray amputation done 08/21/15 -Infectious disease consult appreciated. Blood cultures negative. - on Unasyn. Can be changed to augmentin per ID at discharge - continue dressing and podiatry f/u in 1 week  #2 hyperkalemia- improved with dialysis. Nephrology consulted  #3 end-stage renal disease on hemodialysis-on Tue-Thur-sat dialysis. Nephrology consulted Dialysis yesterday per schedule  #4 diabetes mellitus-continue Levemir and pre-meal insulin and SSI  #5 DVT prophylaxis-subcutaneous heparin  #6 Depression- add lexapro. Already on remeron   Social worker consulted for higher level of care Will need rehab   All the records are reviewed and case discussed with Care Management/Social Workerr. Management plans discussed with the patient, family and they are in agreement.  CODE STATUS: Full Code  TOTAL TIME TAKING CARE OF THIS PATIENT: 37 minutes.   POSSIBLE D/C TOMORROW, DEPENDING ON CLINICAL CONDITION.   Heron Pitcock M.D on 08/22/2015 at 10:05 AM  Between 7am to 6pm - Pager - [phone]  After 6pm go to [email or web address] - Social research officer, governmentpassword [email or web address] [email or web address]  [facility]  Office  [phone]  CC: Primary care physician; Leotis ShamesSingh,Jasmine, MD

## 2015-08-22 NOTE — Progress Notes (Signed)
Pt. refused his oral meds and Heparin this shift. He took the Levemir and Ampicillin.

## 2015-08-22 NOTE — Progress Notes (Signed)
Pt refused meds this AM. He stated that he was nauseous but refused the Zofran. He had the Hiccups all night but refused the Thorazine.Marland Kitchen. He also refused the Heparin. I asked why he did not want to take the med and told him that it will make him feel better, but he stated that it makes no difference.

## 2015-08-22 NOTE — Consult Note (Signed)
Pharmacy Antibiotic Note  Devin Richmond is a 50 y.o. male admitted on 08/17/2015 with gangrene of L toe.  Pharmacy has been consulted for unasyn dosing. Pt is an ESRD pt on HD tu, turs, sat  Plan: Continue Unasyn 3 g IV q24 hours.   Temp (24hrs), Avg:98.4 F (36.9 C), Min:97.8 F (36.6 C), Max:98.9 F (37.2 C)   Recent Labs Lab 08/17/15 1804 08/18/15 0441 08/19/15 0441 08/20/15 0550 08/21/15 0455 08/22/15 0526  WBC 21.6* 22.6* 21.5* 19.8* 17.3* 12.3*  CREATININE 18.25* 13.51*  --   --   --  8.59*    Estimated Creatinine Clearance: 12.3 mL/min (by C-G formula based on SCr of 8.59 mg/dL).    Allergies  Allergen Reactions  . Sulfa Antibiotics Other (See Comments)    unknown    Antimicrobials this admission: vancomycin 8/15 >> 8/16 zosyn 8/15 >> 8/16 unasyn 8/16>>  Dose adjustments this admission:   Microbiology results: 8/15 BCx: Ng <24 hrs 8/15 MRSA PCR: neg  Thank you for allowing pharmacy to be a part of this patient's care.  Demetrius Charityeldrin D. Amy Belloso, PharmD  Clinical Pharmacist  08/22/2015 2:00 PM

## 2015-08-22 NOTE — Progress Notes (Signed)
1 Day Post-Op  Subjective: Patient seen. Denies any significant pain with his left foot.  Objective: Vital signs in last 24 hours: Temp:  [97.5 F (36.4 C)-98.9 F (37.2 C)] 98.5 F (36.9 C) (08/20 0406) Pulse Rate:  [89-104] 102 (08/20 0756) Resp:  [14-24] 20 (08/20 0406) BP: (92-129)/(52-88) 108/52 (08/20 0756) SpO2:  [95 %-100 %] 97 % (08/20 0756) Weight:  [100.4 kg (221 lb 5.5 oz)-101.8 kg (224 lb 6.9 oz)] 100.4 kg (221 lb 5.5 oz) (08/19 1907) Last BM Date:  (not sure pr pt)  Intake/Output from previous day: 08/19 0701 - 08/20 0700 In: 100 [I.V.:100] Out: 525 [Blood:25] Intake/Output this shift: No intake/output data recorded.  The bandage on the left foot is dry and intact. Upon removal of the incision is well coapted with all skin edges appear viable. No purulence or sign of infection.  Lab Results:   Recent Labs  08/21/15 0455 08/22/15 0526  WBC 17.3* 12.3*  HGB 11.4* 10.6*  HCT 34.7* 31.6*  PLT 268 286   BMET  Recent Labs  08/22/15 0526  NA 136  K 4.1  CL 94*  CO2 30  GLUCOSE 191*  BUN 37*  CREATININE 8.59*  CALCIUM 7.7*   PT/INR No results for input(s): LABPROT, INR in the last 72 hours. ABG No results for input(s): PHART, HCO3 in the last 72 hours.  Invalid input(s): PCO2, PO2  Studies/Results: No results found.  Anti-infectives: Anti-infectives    Start     Dose/Rate Route Frequency Ordered Stop   08/19/15 1200  vancomycin (VANCOCIN) IVPB 1000 mg/200 mL premix  Status:  Discontinued     1,000 mg 200 mL/hr over 60 Minutes Intravenous Every T-Th-Sa (Hemodialysis) 08/18/15 0038 08/18/15 1411   08/19/15 0815  cefUROXime (ZINACEF) 1.5 g in dextrose 5 % 50 mL IVPB     1.5 g 100 mL/hr over 30 Minutes Intravenous  Once 08/19/15 0812 08/19/15 0843   08/18/15 2200  Ampicillin-Sulbactam (UNASYN) 3 g in sodium chloride 0.9 % 100 mL IVPB     3 g 100 mL/hr over 60 Minutes Intravenous Every 24 hours 08/18/15 1418     08/18/15 1100  vancomycin  (VANCOCIN) 1,500 mg in sodium chloride 0.9 % 500 mL IVPB  Status:  Discontinued     1,500 mg 250 mL/hr over 120 Minutes Intravenous STAT 08/18/15 1050 08/18/15 1411   08/18/15 0800  piperacillin-tazobactam (ZOSYN) IVPB 3.375 g  Status:  Discontinued     3.375 g 12.5 mL/hr over 240 Minutes Intravenous Every 12 hours 08/17/15 2140 08/17/15 2335   08/18/15 0800  piperacillin-tazobactam (ZOSYN) IVPB 3.375 g  Status:  Discontinued     3.375 g 12.5 mL/hr over 240 Minutes Intravenous Every 12 hours 08/18/15 0038 08/18/15 1411   08/18/15 0600  vancomycin (VANCOCIN) 500 mg in sodium chloride 0.9 % 100 mL IVPB  Status:  Discontinued     500 mg 100 mL/hr over 60 Minutes Intravenous  Once 08/18/15 0038 08/18/15 1050   08/17/15 2200  vancomycin (VANCOCIN) IVPB 1000 mg/200 mL premix  Status:  Discontinued     1,000 mg 200 mL/hr over 60 Minutes Intravenous Every T-Th-Sa (Hemodialysis) 08/17/15 2148 08/17/15 2335   08/17/15 1915  piperacillin-tazobactam (ZOSYN) IVPB 3.375 g     3.375 g 100 mL/hr over 30 Minutes Intravenous  Once 08/17/15 1906 08/17/15 1959   08/17/15 1900  vancomycin (VANCOCIN) IVPB 1000 mg/200 mL premix     1,000 mg 200 mL/hr over 60 Minutes Intravenous  Once 08/17/15  1855 08/17/15 2100   08/17/15 1900  piperacillin-tazobactam (ZOSYN) IVPB 3.375 g  Status:  Discontinued     3.375 g 12.5 mL/hr over 240 Minutes Intravenous  Once 08/17/15 1855 08/17/15 1906   08/17/15 1815  clindamycin (CLEOCIN) IVPB 600 mg     600 mg 100 mL/hr over 30 Minutes Intravenous  Once 08/17/15 1811 08/17/15 1911      Assessment/Plan: s/p Procedure(s): AMPUTATION TOE WITH FIRST RAY RESECTION (Left) Assessment: Status post left first ray resection for gangrene, stable   Plan: Betadine and a sterile bandage reapplied to the left foot. Patient is stable for discharge as far as his foot is concerned. Keep the bandage dry and intact and in place and will follow the patient up towards the end of this upcoming  week  LOS: 5 days    Ricci Barkerodd W Camerin Jimenez 08/22/2015

## 2015-08-23 ENCOUNTER — Encounter: Payer: Self-pay | Admitting: Podiatry

## 2015-08-23 ENCOUNTER — Inpatient Hospital Stay: Payer: Medicare Other

## 2015-08-23 LAB — GLUCOSE, CAPILLARY
GLUCOSE-CAPILLARY: 134 mg/dL — AB (ref 65–99)
Glucose-Capillary: 135 mg/dL — ABNORMAL HIGH (ref 65–99)
Glucose-Capillary: 172 mg/dL — ABNORMAL HIGH (ref 65–99)
Glucose-Capillary: 166 mg/dL — ABNORMAL HIGH (ref 65–99)

## 2015-08-23 MED ORDER — INSULIN ASPART 100 UNIT/ML ~~LOC~~ SOLN: 2.0000 [IU] | Freq: Three times a day (TID) | SUBCUTANEOUS | 11 refills | Status: DC

## 2015-08-23 MED ORDER — CHLORPROMAZINE HCL 10 MG PO TABS: 10.0000 mg | ORAL_TABLET | Freq: Three times a day (TID) | ORAL | 0 refills | Status: DC | PRN

## 2015-08-23 MED ORDER — MECLIZINE HCL 25 MG PO TABS: 25.0000 mg | ORAL_TABLET | Freq: Two times a day (BID) | ORAL | 0 refills | Status: DC

## 2015-08-23 MED ORDER — SENNA 8.6 MG PO TABS
2.0000 | ORAL_TABLET | Freq: Every day | ORAL | Status: DC
Start: 2015-08-23 — End: 2015-08-25
  Administered 2015-08-24: 17.2 mg via ORAL
  Filled 2015-08-23: qty 2

## 2015-08-23 MED ORDER — LIDOCAINE-PRILOCAINE 2.5-2.5 % EX CREA: 1.0000 "application " | TOPICAL_CREAM | Freq: Every day | CUTANEOUS | 0 refills | Status: DC | PRN

## 2015-08-23 MED ORDER — AMOXICILLIN-POT CLAVULANATE 500-125 MG PO TABS: 1.0000 | ORAL_TABLET | Freq: Every day | ORAL | 0 refills | Status: DC

## 2015-08-23 MED ORDER — ESCITALOPRAM OXALATE 10 MG PO TABS: 10.0000 mg | ORAL_TABLET | Freq: Every day | ORAL | 0 refills | Status: DC

## 2015-08-23 NOTE — Progress Notes (Signed)
Patient appetite decreased and refused to take majority of medications. Patient stated I want to go home, primary RN reminded patient of discharge process. Pt resting in bed continue to assess.

## 2015-08-23 NOTE — Progress Notes (Signed)
[facility]  ROUNDING NOTE   Subjective:   Laying in bed. States pain is well controlled.   Objective:  Vital signs in last 24 hours:  Temp:  [98.1 F (36.7 C)-98.6 F (37 C)] 98.3 F (36.8 C) (08/21 0534) Pulse Rate:  [90-99] 92 (08/21 0534) Resp:  [18-20] 20 (08/21 0534) BP: (100-144)/(64-75) 144/75 (08/21 0534) SpO2:  [94 %-100 %] 100 % (08/21 0534)  Weight change:  Filed Weights   08/17/15 2050 08/21/15 1525 08/21/15 1907  Weight: 86.1 kg (189 lb 13.1 oz) 101.8 kg (224 lb 6.9 oz) 100.4 kg (221 lb 5.5 oz)    Intake/Output: No intake/output data recorded.   Intake/Output this shift:  No intake/output data recorded.  Physical Exam: General: No acute distress  Head: Normocephalic, atraumatic. Moist oral mucosal membranes  Eyes: Anicteric  Neck: Supple, trachea midline  Lungs:  Clear to auscultation, normal effort  Heart: S1S2 no rubs  Abdomen:  Soft, nontender, BS present  Extremities: trace peripheral edema, left foot wrapped  Neurologic: Nonfocal, moving all four extremities  Skin: No lesions  Access: Left upper extremity AV fistula     Basic Metabolic Panel:  Recent Labs Lab 08/17/15 1804 08/18/15 0441 08/22/15 0526  NA 136 139 136  K 5.8* 5.0 4.1  CL 94* 94* 94*  CO2 25 31 30   GLUCOSE 316* 256* 191*  BUN 89* 71* 37*  CREATININE 18.25* 13.51* 8.59*  CALCIUM 7.9* 8.0* 7.7*    Liver Function Tests: No results for input(s): AST, ALT, ALKPHOS, BILITOT, PROT, ALBUMIN in the last 168 hours. No results for input(s): LIPASE, AMYLASE in the last 168 hours. No results for input(s): AMMONIA in the last 168 hours.  CBC:  Recent Labs Lab 08/18/15 0441 08/19/15 0441 08/20/15 0550 08/21/15 0455 08/22/15 0526  WBC 22.6* 21.5* 19.8* 17.3* 12.3*  HGB 11.7* 11.7* 11.7* 11.4* 10.6*  HCT 35.6* 35.3* 35.3* 34.7* 31.6*  MCV 95.3 95.1 95.5 96.8 95.6  PLT 239 239 258 268 286    Cardiac Enzymes: No results for input(s): CKTOTAL, CKMB,  CKMBINDEX, TROPONINI in the last 168 hours.  BNP: Invalid input(s): POCBNP  CBG:  Recent Labs Lab 08/22/15 0741 08/22/15 1125 08/22/15 1618 08/22/15 2140 08/23/15 0743  GLUCAP 185* 180* 130* 157* 134*    Microbiology: Results for orders placed or performed during the hospital encounter of 08/17/15  Culture, blood (Routine X 2) w Reflex to ID Panel     Status: None   Collection Time: 08/17/15  6:10 PM  Result Value Ref Range Status   Specimen Description BLOOD  Final   Special Requests 4ML  Final   Culture NO GROWTH 5 DAYS  Final   Report Status 08/22/2015 FINAL  Final  Culture, blood (Routine X 2) w Reflex to ID Panel     Status: None   Collection Time: 08/17/15  6:40 PM  Result Value Ref Range Status   Specimen Description BLOOD  Final   Special Requests 4ML  Final   Culture NO GROWTH 5 DAYS  Final   Report Status 08/22/2015 FINAL  Final  MRSA PCR Screening     Status: None   Collection Time: 08/18/15  3:00 AM  Result Value Ref Range Status   MRSA by PCR NEGATIVE NEGATIVE Final    Comment:        The GeneXpert MRSA Assay (FDA approved for NASAL specimens only), is one component of a comprehensive MRSA colonization surveillance program. It is not intended to diagnose MRSA  infection nor to guide or monitor treatment for MRSA infections.     Coagulation Studies: No results for input(s): LABPROT, INR in the last 72 hours.  Urinalysis: No results for input(s): COLORURINE, LABSPEC, PHURINE, GLUCOSEU, HGBUR, BILIRUBINUR, KETONESUR, PROTEINUR, UROBILINOGEN, NITRITE, LEUKOCYTESUR in the last 72 hours.  Invalid input(s): APPERANCEUR    Imaging: No results found.   Medications:     . ampicillin-sulbactam (UNASYN) IV  3 g Intravenous Q24H  . atorvastatin  80 mg Oral QHS  . atropine  1 drop Left Eye BID  . carvedilol  12.5 mg Oral BID WC  . cinacalcet  90 mg Oral Q breakfast  . escitalopram  10 mg Oral Daily  . famotidine  20 mg Oral Daily  . folic acid   500 mcg Oral Daily  . heparin  5,000 Units Subcutaneous Q8H  . insulin aspart  0-15 Units Subcutaneous TID WC  . insulin aspart  0-5 Units Subcutaneous QHS  . insulin detemir  5 Units Subcutaneous QHS  . lanthanum  1,000 mg Oral TID WC  . loratadine  10 mg Oral Daily  . meclizine  25 mg Oral BID  . mirtazapine  30 mg Oral QHS  . 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 End-stage renal disease TTS: Dialysis for tomorrow.  - monitor potassium  2. Hypertension: blood pressure at goal.  - carvedilol  3. Secondary hyperparathyroidism.   -  Continue Renvela, Fosrenol, and Sensipar.  4. Anemia of chronic kidney disease.  - epo as outpatient.   LOS: 6 Devin Richmond 8/21/201711:55 AM

## 2015-08-23 NOTE — Clinical Social Work Note (Signed)
Patient has had a bed offer from [facility] and this is a preference for patient's daughter. Patient will be a level 2 pasrr and thus cannot discharge until this has been received. MD updated. York SpanielMonica Brailyn Killion MSW,LCSW [phone]

## 2015-08-23 NOTE — Evaluation (Addendum)
Physical Therapy Evaluation Patient Details Name: Devin Richmond MRN: [id number] DOB: Nov 17, 1965 Today's Date: 08/23/2015   History of Present Illness  50 yo male with history of gangrene in L great toe received balloon angioplasty to R femoral artery, and amputation of L great toe and 1st met ray. PMHx:  anemia, CVA, DM, retinopathy, neuropathy, pancreatitis  Clinical Impression  Patient admitted for L great toe gangrene, s/p 1st toe and 1st met ray amputation. He is currently NWB on LLE. Prior to this admission he was ambulating at group home with 4WW. He appears to have cognitive deficits at baseline and provides only short answers as well as having difficulty following commands consistently. He is unable to stand on 3 separate attempts and attempts to push through LLE though PT educated extensively he was to maintain NWB on LLE. PT had LLE rest on one of his feet to ensure NWB maintained. Patient also demonstrates poor sitting balance R posterior lean noted. Patient would benefit from STR to increase his independence with mobility.    Follow Up Recommendations SNF    Equipment Recommendations       Recommendations for Other Services       Precautions / Restrictions Precautions Precautions: Fall Restrictions Weight Bearing Restrictions: Yes LLE Weight Bearing: Non weight bearing Other Position/Activity Restrictions: Discussed with Dr. Ether Griffins, he stated 1st toe amputations are typically NWB      Mobility  Bed Mobility Overal bed mobility: Needs Assistance Bed Mobility: Supine to Sit;Sit to Supine     Supine to sit: Max assist Sit to supine: Max assist   General bed mobility comments: Patient did not follow commands to participate aside from using hand rails with UEs to complete transfer.   Transfers                 General transfer comment: Attempted with max A x3 attempts, unable to complete as patient did not bring shoulders forward needed to have foot rest  on PT's foot to ensure NWB status maintained.   Ambulation/Gait                Stairs            Wheelchair Mobility    Modified Rankin (Stroke Patients Only)       Balance Overall balance assessment: Needs assistance Sitting-balance support: Bilateral upper extremity supported Sitting balance-Leahy Scale: Poor   Postural control: Posterior lean;Right lateral lean   Standing balance-Leahy Scale: Zero                               Pertinent Vitals/Pain Pain Assessment: No/denies pain    Home Living Family/patient expects to be discharged to:: Skilled nursing facility                      Prior Function           Comments: Patient is quite limited with history, states he usually walks with a 4WW at home.      Hand Dominance        Extremity/Trunk Assessment   Upper Extremity Assessment: Generalized weakness           Lower Extremity Assessment: Generalized weakness         Communication   Communication:  (Very lethargic)  Cognition Arousal/Alertness: Lethargic Behavior During Therapy: Flat affect Overall Cognitive Status:  (Appears to have cognitive deficits, unable to follow commands consistently, responds in  very short answers. )                      General Comments General comments (skin integrity, edema, etc.): L foot wrapped    Exercises        Assessment/Plan    PT Assessment Patient needs continued PT services  PT Diagnosis Generalized weakness;Difficulty walking   PT Problem List Decreased strength;Decreased range of motion;Decreased activity tolerance;Decreased balance;Decreased mobility;Decreased coordination;Decreased cognition;Decreased knowledge of use of DME;Decreased safety awareness;Decreased knowledge of precautions;Decreased skin integrity  PT Treatment Interventions DME instruction;Gait training;Functional mobility training;Therapeutic activities;Therapeutic exercise;Balance  training;Neuromuscular re-education;Patient/family education   PT Goals (Current goals can be found in the Care Plan section) Acute Rehab PT Goals Patient Stated Goal: none stated PT Goal Formulation: Patient unable to participate in goal setting Time For Goal Achievement: 09/06/15 Potential to Achieve Goals: Fair    Frequency 7X/week   Barriers to discharge   Patient is unable to ambulate and return to group home.     Co-evaluation               End of Session Equipment Utilized During Treatment: Gait belt Activity Tolerance: Patient limited by lethargy Patient left: in bed;with call bell/phone within reach;with bed alarm set Nurse Communication: Mobility status         Time:  -    [id number]  Charges:    PT Re-evaluation (Due to change in WB status)     PT G Codes:       Kerman Passey, PT, DPT    08/23/2015, 5:28 PM  Addendum - late entry charges  Kerman Passey, PT, DPT

## 2015-08-23 NOTE — Progress Notes (Signed)
[facility] [place] [facility]   [facility]    MR#:  [id number]  DATE OF BIRTH:  09/16/1965  SUBJECTIVE:   Patient continues to have cups. Pain is controlled.  REVIEW OF SYSTEMS:    Review of Systems  Constitutional: Negative.  Negative for chills, fever and malaise/fatigue.       Chronic hiccups  HENT: Negative.  Negative for ear discharge, ear pain, hearing loss, nosebleeds and sore throat.   Eyes: Negative.  Negative for blurred vision and pain.  Respiratory: Negative.  Negative for cough, hemoptysis, shortness of breath and wheezing.   Cardiovascular: Negative.  Negative for chest pain, palpitations and leg swelling.  Gastrointestinal: Negative.  Negative for abdominal pain, blood in stool, diarrhea, nausea and vomiting.  Genitourinary: Negative.  Negative for dysuria.  Musculoskeletal: Negative.  Negative for back pain.  Skin: Negative.   Neurological: Negative for dizziness, tremors, speech change, focal weakness, seizures and headaches.  Endo/Heme/Allergies: Negative.  Does not bruise/bleed easily.  Psychiatric/Behavioral: Negative.  Negative for depression, hallucinations and suicidal ideas.    Tolerating Diet: yes      DRUG ALLERGIES:   Allergies  Allergen Reactions  . Sulfa Antibiotics Other (See Comments)    unknown    VITALS:  Blood pressure (!) 144/75, pulse 92, temperature 98.3 F (36.8 C), temperature source Oral, resp. rate 20, height 6' (1.829 m), weight 100.4 kg (221 lb 5.5 oz), SpO2 100 %.  PHYSICAL EXAMINATION:   Physical Exam  Constitutional: He is oriented to person, place, and time and well-developed, well-nourished, and in no distress. No distress.  HENT:  Head: Normocephalic.  Eyes: No scleral icterus.  Neck: Normal range of motion. Neck supple. No JVD present. No tracheal deviation present.  Cardiovascular: Normal rate, regular rhythm and normal heart sounds.  Exam reveals no gallop and no friction rub.    No murmur heard. Pulmonary/Chest: Effort normal and breath sounds normal. No respiratory distress. He has no wheezes. He has no rales. He exhibits no tenderness.  Abdominal: Soft. Bowel sounds are normal. He exhibits no distension and no mass. There is no tenderness. There is no rebound and no guarding.  Musculoskeletal: Normal range of motion. He exhibits no edema.  Neurological: He is alert and oriented to person, place, and time.  Skin: Skin is warm.  wrapped, foot  Psychiatric: Affect and judgment normal.      LABORATORY PANEL:   CBC  Recent Labs Lab 08/22/15 0526  WBC 12.3*  HGB 10.6*  HCT 31.6*  PLT 286   ------------------------------------------------------------------------------------------------------------------  Chemistries   Recent Labs Lab 08/22/15 0526  NA 136  K 4.1  CL 94*  CO2 30  GLUCOSE 191*  BUN 37*  CREATININE 8.59*  CALCIUM 7.7*   ------------------------------------------------------------------------------------------------------------------  Cardiac Enzymes No results for input(s): TROPONINI in the last 168 hours. ------------------------------------------------------------------------------------------------------------------  RADIOLOGY:  No results found.   ASSESSMENT AND PLAN:   50 y.o. malewith a known history of End-stage renal disease on T-Th-S dialysis, neuropathy, stroke, peripheral vascular disease, diabetic retinopathy, hypertension, diabetes mellitus presents from group home secondary to worsening left great toe gangrene.  1 sepsis-secondary to left great toe gangrene with no osteomyelitis on X ray.  Appreciate Podiatry and vascular consults. He is status post angiogram and angioplasty of posterior tibial artery and left first toe amputation and first metatarsal ray amputation on 08/21/15. He is currently on Unasyn and will be changed to Augmentin 500 mg daily which is renally dosed as  per ID consult.  He will need this treatment for 2 weeks postop. He will follow-up podiatry in 1 week. Keep dressing dry.  2 hyperkalemia: This improved with dialysis. Nephrology consult appreciated.  3 end-stage renal disease on hemodialysis on Tue-Thur-sat dialysis: Patient will continue outpatient regimen. 4 diabetes mellitus: He will  continue Levemir and pre-meal insulin and SSI With ADA diet.   5.  Depression: Continue Lexapro and Remeron.  6. Hiccups: Continue when necessary Thorazine. Check chest x-ray.    patient has a place for disposition however waiting for pASSR  Management plans discussed with the patient and he is in agreement.  CODE STATUS: full  TOTAL TIME TAKING CARE OF THIS PATIENT: 29 minutes.  D/w CSW   POSSIBLE D/C WEDNESDAY, DEPENDING ON CLINICAL CONDITION.   Devin Richmond M.D on 08/23/2015 at 12:52 PM  Between 7am to 6pm - Pager - [phone] After 6pm go to [email or web address] - password [email or web address]  [facility]  Office  [phone]  CC: Primary care physician; Devin Richmond,Jasmine, MD  Note: This dictation was prepared with Dragon dictation along with smaller phrase technology. Any transcriptional errors that result from this process are unintentional.

## 2015-08-23 NOTE — Care Management Important Message (Signed)
Important Message  Patient Details  Name: Devin Richmond MRN: [id number] Date of Birth: [birth year]   Medicare Important Message Given:  Yes    Eber HongGreene, Lofton Leon R, RN 08/23/2015, 11:27 AM

## 2015-08-23 NOTE — Care Management (Signed)
Skilled nursing facility placement has been recommended.  Patient will require level 2 pasarr

## 2015-08-23 NOTE — Progress Notes (Signed)
[facility] Date of Admission:  08/17/2015     ID: [name] is a 50 y.o. male with gangrene great toe Active Problems:   Ischemic foot   Subjective: S/p angioplasty 8/17  Had fever yest.   ROS  Unable to obtain   Medications:  Antibiotics Given (last 72 hours)    Date/Time Action Medication Dose Rate   08/20/15 2106 Given   Ampicillin-Sulbactam (UNASYN) 3 g in sodium chloride 0.9 % 100 mL IVPB 3 g 100 mL/hr   08/21/15 2237 Given   Ampicillin-Sulbactam (UNASYN) 3 g in sodium chloride 0.9 % 100 mL IVPB 3 g 100 mL/hr   08/22/15 2216 Given   Ampicillin-Sulbactam (UNASYN) 3 g in sodium chloride 0.9 % 100 mL IVPB 3 g 100 mL/hr     . ampicillin-sulbactam (UNASYN) IV  3 g Intravenous Q24H  . atorvastatin  80 mg Oral QHS  . atropine  1 drop Left Eye BID  . carvedilol  12.5 mg Oral BID WC  . cinacalcet  90 mg Oral Q breakfast  . escitalopram  10 mg Oral Daily  . famotidine  20 mg Oral Daily  . folic acid  500 mcg Oral Daily  . heparin  5,000 Units Subcutaneous Q8H  . insulin aspart  0-15 Units Subcutaneous TID WC  . insulin aspart  0-5 Units Subcutaneous QHS  . insulin detemir  5 Units Subcutaneous QHS  . lanthanum  1,000 mg Oral TID WC  . loratadine  10 mg Oral Daily  . meclizine  25 mg Oral BID  . mirtazapine  30 mg Oral QHS  . senna  2 tablet Oral Daily  . sevelamer carbonate  0.8 g Oral TID WC    Objective: Vital signs in last 24 hours: Temp:  [98.1 F (36.7 C)-98.3 F (36.8 C)] 98.3 F (36.8 C) (08/21 0534) Pulse Rate:  [90-97] 92 (08/21 0534) Resp:  [20] 20 (08/21 0534) BP: (119-144)/(64-75) 144/75 (08/21 0534) SpO2:  [96 %-100 %] 100 % (08/21 0534) Constitutional: lying in bed, barely verbal, HENT: anicteric Mouth/Throat: Oropharynx is clear and moist. No oropharyngeal exudate.  Cardiovascular: Normal rate, regular rhythm and normal heart sounds. Pulmonary/Chest: Effort normal and breath sounds normal. No  respiratory distress. He has no wheezes.  Abdominal: Soft. Bowel sounds are normal.He has no cervical adenopathy.  Neurological:minimally interactive Ext no edema vasc diminished pulsed DP and PT bil, feet are both cool Skin: L great toe with tip with gangrenous changes, foul odor, no drainage noted,  Psychiatric: He has a normal mood and affect. His behavior is normal.   Lab Results  Recent Labs  08/21/15 0455 08/22/15 0526  WBC 17.3* 12.3*  HGB 11.4* 10.6*  HCT 34.7* 31.6*  NA  --  136  K  --  4.1  CL  --  94*  CO2  --  30  BUN  --  37*  CREATININE  --  8.59*    Microbiology: Results for orders placed or performed during the hospital encounter of 08/17/15  Culture, blood (Routine X 2) w Reflex to ID Panel     Status: None   Collection Time: 08/17/15  6:10 PM  Result Value Ref Range Status   Specimen Description BLOOD  Final   Special Requests 4ML  Final   Culture NO GROWTH 5 DAYS  Final   Report Status 08/22/2015 FINAL  Final  Culture, blood (Routine X 2) w Reflex to ID Panel     Status:  None   Collection Time: 08/17/15  6:40 PM  Result Value Ref Range Status   Specimen Description BLOOD  Final   Special Requests 4ML  Final   Culture NO GROWTH 5 DAYS  Final   Report Status 08/22/2015 FINAL  Final  MRSA PCR Screening     Status: None   Collection Time: 08/18/15  3:00 AM  Result Value Ref Range Status   MRSA by PCR NEGATIVE NEGATIVE Final    Comment:        The GeneXpert MRSA Assay (FDA approved for NASAL specimens only), is one component of a comprehensive MRSA colonization surveillance program. It is not intended to diagnose MRSA infection nor to guide or monitor treatment for MRSA infections.     Studies/Results:  Dg Chest Portable 1 View  Result Date: 08/17/2015 CLINICAL DATA:  Cough.  Weakness since Sunday. EXAM: PORTABLE CHEST 1 VIEW COMPARISON:  06/24/2015 FINDINGS: Midline trachea.  Normal heart size and mediastinal contours. Sharp costophrenic  angles.  No pneumothorax.  Clear lungs. Patient rotated minimally right. IMPRESSION: No active disease. Electronically Signed   By: Jeronimo GreavesKyle  Talbot M.D.   On: 08/17/2015 19:15   Dg Foot Complete Left  Result Date: 08/17/2015 CLINICAL DATA:  Ischemic great toe. Left great toe wound for 2 months. EXAM: LEFT FOOT - COMPLETE 3+ VIEW COMPARISON:  None. FINDINGS: There is soft tissue ulceration involving the great toe at the level of the distal phalanx with heterogeneous lucency compatible with soft tissue emphysema. No definite underlying osseous erosion is identified to indicate osteomyelitis. No acute fracture or dislocation is identified. There are dorsiflexion deformities at the second through fifth MCP joints. Diffuse atherosclerotic vascular calcification is present. There is diffuse soft tissue swelling about the forefoot. IMPRESSION: Soft tissue ulceration and soft tissue emphysema involving the left great toe. No definite radiographic evidence of osteomyelitis. Electronically Signed   By: Sebastian AcheAllen  Grady M.D.   On: 08/17/2015 17:57    Assessment/Plan: Devin Richmond Devin Devin Richmond is a 50 y.o. male with ESRD, HTN, PVD, DM admitted with gangrenous changes and infection of L great toe.  Will undergo angiogram and possible revascularization but will need amputation as well of at least the toe.On admit wbc was 22, no fevers and started on vanco zosyn. He had Grp G strep bacteremia in June  And was at one point treated with keflex. Unclear other abx use recently. He is allergic to bactrim  He is now s/ p revasc and for toe amputation 8/19  Recommendations Cont unasyn while inpatient Unless Podiatry or vascular feels strongly about it I do not think he will need IV abx at dc.  At DC would suggest change to augmentin 500 QD (renal dosing with ESRD) for a 2 week post op course.  Thank you very much for the consult. Will sign off   Marck Mcclenny P   08/23/2015, 1:46 PM

## 2015-08-24 LAB — GLUCOSE, CAPILLARY
GLUCOSE-CAPILLARY: 159 mg/dL — AB (ref 65–99)
Glucose-Capillary: 124 mg/dL — ABNORMAL HIGH (ref 65–99)

## 2015-08-24 LAB — SURGICAL PATHOLOGY

## 2015-08-24 NOTE — Clinical Social Work Note (Signed)
Level 2 pasrr pending. York SpanielMonica Howard Bunte MSW,LCSW [phone]

## 2015-08-24 NOTE — Progress Notes (Signed)
[facility] [place] [facility]   PATIENT NAME: Devin Richmond    MR#:  [id number]  DATE OF BIRTH:  [birth year]  SUBJECTIVE:   Hiccups have resolved Wants to leave hospital  REVIEW OF SYSTEMS:    Review of Systems  Constitutional: Negative.  Negative for chills, fever and malaise/fatigue.  HENT: Negative.  Negative for ear discharge, ear pain, hearing loss, nosebleeds and sore throat.   Eyes: Negative.  Negative for blurred vision and pain.  Respiratory: Negative.  Negative for cough, hemoptysis, shortness of breath and wheezing.   Cardiovascular: Negative.  Negative for chest pain, palpitations and leg swelling.  Gastrointestinal: Negative.  Negative for abdominal pain, blood in stool, diarrhea, nausea and vomiting.  Genitourinary: Negative.  Negative for dysuria.  Musculoskeletal: Negative.  Negative for back pain.  Skin: Negative.   Neurological: Negative for dizziness, tremors, speech change, focal weakness, seizures and headaches.  Endo/Heme/Allergies: Negative.  Does not bruise/bleed easily.  Psychiatric/Behavioral: Negative.  Negative for depression, hallucinations and suicidal ideas.    Tolerating Diet: yes      DRUG ALLERGIES:   Allergies  Allergen Reactions  . Sulfa Antibiotics Other (See Comments)    unknown    VITALS:  Blood pressure 127/82, pulse 100, temperature 97.9 F (36.6 C), temperature source Oral, resp. rate 20, height 6' (1.829 m), weight 99.8 kg (220 lb 0.3 oz), SpO2 100 %.  PHYSICAL EXAMINATION:   Physical Exam  Constitutional: He is oriented to person, place, and time and well-developed, well-nourished, and in no distress. No distress.  HENT:  Head: Normocephalic.  Eyes: No scleral icterus.  Neck: Normal range of motion. Neck supple. No JVD present. No tracheal deviation present.  Cardiovascular: Normal rate, regular rhythm and normal heart sounds.  Exam reveals no gallop and no friction rub.   No murmur  heard. Pulmonary/Chest: Effort normal and breath sounds normal. No respiratory distress. He has no wheezes. He has no rales. He exhibits no tenderness.  Abdominal: Soft. Bowel sounds are normal. He exhibits no distension and no mass. There is no tenderness. There is no rebound and no guarding.  Musculoskeletal: Normal range of motion. He exhibits no edema.  Neurological: He is alert and oriented to person, place, and time.  Skin: Skin is warm.  wrapped, foot  Psychiatric: Affect and judgment normal.      LABORATORY PANEL:   CBC  Recent Labs Lab 08/22/15 0526  WBC 12.3*  HGB 10.6*  HCT 31.6*  PLT 286   ------------------------------------------------------------------------------------------------------------------  Chemistries   Recent Labs Lab 08/22/15 0526  NA 136  K 4.1  CL 94*  CO2 30  GLUCOSE 191*  BUN 37*  CREATININE 8.59*  CALCIUM 7.7*   ------------------------------------------------------------------------------------------------------------------  Cardiac Enzymes No results for input(s): TROPONINI in the last 168 hours. ------------------------------------------------------------------------------------------------------------------  RADIOLOGY:  Dg Chest 1 View  Result Date: 08/23/2015 CLINICAL DATA:  Hiccups EXAM: CHEST 1 VIEW COMPARISON:  08/17/2015 FINDINGS: Cardiac shadow is mildly enlarged but stable. The lungs are clear bilaterally. No acute infiltrate or sizable effusion is seen. No bony abnormality is noted. IMPRESSION: No active disease. Electronically Signed   By: Alcide CleverMark  Lukens M.D.   On: 08/23/2015 13:47     ASSESSMENT AND PLAN:   50 y.o. malewith a known history of End-stage renal disease on T-Th-S dialysis, neuropathy, stroke, peripheral vascular disease, diabetic retinopathy, hypertension, diabetes mellitus presents from group home secondary to worsening left great toe gangrene.  1 sepsis-secondary to left great toe  gangrene with  no osteomyelitis on X ray.  Appreciate Podiatry and vascular consults. He is status post angiogram and angioplasty of posterior tibial artery and left first toe amputation and first metatarsal ray amputation on 08/21/15. He is currently on Unasyn and will be changed to Augmentin 500 mg daily which is renally dosed as per ID consult at discharge He will need this treatment for 2 weeks postop. He will follow-up podiatry on Thursday.  Keep dressing dry.  2 hyperkalemia: This improved with dialysis. Nephrology consult appreciated.  3 end-stage renal disease on hemodialysis on Tue-Thur-sat dialysis: Patient will continue outpatient regimen. 4 diabetes mellitus: He will  continue Levemir and pre-meal insulin and SSI With ADA diet.   5.  Depression: Continue Lexapro and Remeron.  6. Hiccups: Continue when necessary Thorazine.    patient has a place for disposition however waiting for PASSR  Management plans discussed with the patient and he is in agreement.  CODE STATUS: full  TOTAL TIME TAKING CARE OF THIS PATIENT: 24 minutes.  D/w CSW   POSSIBLE D/C WEDNESDAY, DEPENDING ON CLINICAL CONDITION.   Devin Richmond M.D on 08/24/2015 at 11:50 AM  Between 7am to 6pm - Pager - [phone] After 6pm go to [email or web address] - password [email or web address]  [facility]  Office  [phone]  CC: Primary care physician; Devin ShamesSingh,Jasmine, MD  Note: This dictation was prepared with Dragon dictation along with smaller phrase technology. Any transcriptional errors that result from this process are unintentional.

## 2015-08-24 NOTE — Progress Notes (Signed)
Dialysis complete

## 2015-08-24 NOTE — Progress Notes (Signed)
[facility]  ROUNDING NOTE   Subjective:   Seen and examined on hemodialysis treatment. Tolerating treatment well. UF 1.2 litres  Objective:  Vital signs in last 24 hours:  Temp:  [97.5 F (36.4 C)-98.5 F (36.9 C)] 97.9 F (36.6 C) (08/22 0912) Pulse Rate:  [92-104] 104 (08/22 1100) Resp:  [16-24] 21 (08/22 1100) BP: (121-144)/(81-93) 130/81 (08/22 1100) SpO2:  [96 %-100 %] 99 % (08/22 1100) Weight:  [99.8 kg (220 lb 0.3 oz)] 99.8 kg (220 lb 0.3 oz) (08/22 0912)  Weight change:  Filed Weights   08/21/15 1525 08/21/15 1907 08/24/15 0912  Weight: 101.8 kg (224 lb 6.9 oz) 100.4 kg (221 lb 5.5 oz) 99.8 kg (220 lb 0.3 oz)    Intake/Output: I/O last 3 completed shifts: In: 340 [P.O.:240; IV Piggyback:100] Out: 0    Intake/Output this shift:  No intake/output data recorded.  Physical Exam: General: No acute distress  Head: Normocephalic, atraumatic. Moist oral mucosal membranes  Eyes: Anicteric  Neck: Supple, trachea midline  Lungs:  Clear to auscultation, normal effort  Heart: S1S2 no rubs  Abdomen:  Soft, nontender, BS present  Extremities: No peripheral edema, left foot wrapped  Neurologic: Nonfocal, moving all four extremities  Skin: No lesions  Access: Left upper extremity AV fistula     Basic Metabolic Panel:  Recent Labs Lab 08/17/15 1804 08/18/15 0441 08/22/15 0526  NA 136 139 136  K 5.8* 5.0 4.1  CL 94* 94* 94*  CO2 25 31 30   GLUCOSE 316* 256* 191*  BUN 89* 71* 37*  CREATININE 18.25* 13.51* 8.59*  CALCIUM 7.9* 8.0* 7.7*    Liver Function Tests: No results for input(s): AST, ALT, ALKPHOS, BILITOT, PROT, ALBUMIN in the last 168 hours. No results for input(s): LIPASE, AMYLASE in the last 168 hours. No results for input(s): AMMONIA in the last 168 hours.  CBC:  Recent Labs Lab 08/18/15 0441 08/19/15 0441 08/20/15 0550 08/21/15 0455 08/22/15 0526  WBC 22.6* 21.5* 19.8* 17.3* 12.3*  HGB 11.7* 11.7* 11.7* 11.4* 10.6*  HCT 35.6*  35.3* 35.3* 34.7* 31.6*  MCV 95.3 95.1 95.5 96.8 95.6  PLT 239 239 258 268 286    Cardiac Enzymes: No results for input(s): CKTOTAL, CKMB, CKMBINDEX, TROPONINI in the last 168 hours.  BNP: Invalid input(s): POCBNP  CBG:  Recent Labs Lab 08/23/15 0743 08/23/15 1207 08/23/15 1640 08/23/15 2136 08/24/15 0724  GLUCAP 134* 135* 172* 166* 159*    Microbiology: Results for orders placed or performed during the hospital encounter of 08/17/15  Culture, blood (Routine X 2) w Reflex to ID Panel     Status: None   Collection Time: 08/17/15  6:10 PM  Result Value Ref Range Status   Specimen Description BLOOD  Final   Special Requests 4ML  Final   Culture NO GROWTH 5 DAYS  Final   Report Status 08/22/2015 FINAL  Final  Culture, blood (Routine X 2) w Reflex to ID Panel     Status: None   Collection Time: 08/17/15  6:40 PM  Result Value Ref Range Status   Specimen Description BLOOD  Final   Special Requests 4ML  Final   Culture NO GROWTH 5 DAYS  Final   Report Status 08/22/2015 FINAL  Final  MRSA PCR Screening     Status: None   Collection Time: 08/18/15  3:00 AM  Result Value Ref Range Status   MRSA by PCR NEGATIVE NEGATIVE Final    Comment:  The GeneXpert MRSA Assay (FDA approved for NASAL specimens only), is one component of a comprehensive MRSA colonization surveillance program. It is not intended to diagnose MRSA infection nor to guide or monitor treatment for MRSA infections.     Coagulation Studies: No results for input(s): LABPROT, INR in the last 72 hours.  Urinalysis: No results for input(s): COLORURINE, LABSPEC, PHURINE, GLUCOSEU, HGBUR, BILIRUBINUR, KETONESUR, PROTEINUR, UROBILINOGEN, NITRITE, LEUKOCYTESUR in the last 72 hours.  Invalid input(s): APPERANCEUR    Imaging: Dg Chest 1 View  Result Date: 08/23/2015 CLINICAL DATA:  Hiccups EXAM: CHEST 1 VIEW COMPARISON:  08/17/2015 FINDINGS: Cardiac shadow is mildly enlarged but stable. The lungs are  clear bilaterally. No acute infiltrate or sizable effusion is seen. No bony abnormality is noted. IMPRESSION: No active disease. Electronically Signed   By: Alcide CleverMark  Lukens M.D.   On: 08/23/2015 13:47     Medications:     . ampicillin-sulbactam (UNASYN) IV  3 g Intravenous Q24H  . atorvastatin  80 mg Oral QHS  . atropine  1 drop Left Eye BID  . carvedilol  12.5 mg Oral BID WC  . cinacalcet  90 mg Oral Q breakfast  . escitalopram  10 mg Oral Daily  . famotidine  20 mg Oral Daily  . folic acid  500 mcg Oral Daily  . heparin  5,000 Units Subcutaneous Q8H  . insulin aspart  0-15 Units Subcutaneous TID WC  . insulin aspart  0-5 Units Subcutaneous QHS  . insulin detemir  5 Units Subcutaneous QHS  . lanthanum  1,000 mg Oral TID WC  . loratadine  10 mg Oral Daily  . meclizine  25 mg Oral BID  . mirtazapine  30 mg Oral QHS  . senna  2 tablet Oral Daily  . 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 End-stage renal disease TTS: seen and examined on hemodialysis. Tolerating treatment well.  - Continue TTS schedule - monitor potassium  2. Hypertension: blood pressure at goal.  - carvedilol  3. Secondary hyperparathyroidism.   -  Continue Renvela, Fosrenol, and Sensipar.  4. Anemia of chronic kidney disease.  - epo as outpatient. No indication for epo today.   LOS: 7 Cordarious Zeek 8/22/201711:16 AM

## 2015-08-24 NOTE — Progress Notes (Signed)
Post dialysis 

## 2015-08-24 NOTE — Progress Notes (Signed)
PT Cancellation Note  Patient Details Name: Devin Richmond MRN: [id number] DOB: 1965/04/30   Cancelled Treatment:    Reason Eval/Treat Not Completed: Patient at procedure or test/unavailable   Attempted x 2 this am.  Pt at dialysis.    [name] 08/24/2015, 10:25 AM

## 2015-08-24 NOTE — Progress Notes (Signed)
Pre Dialysis 

## 2015-08-24 NOTE — Clinical Social Work Note (Signed)
Judeth CornfieldStephanie with the [facility] Evaluation Team called and stated she would come to do her assessment. [facility] will not be in today. Hoping by tomorrow. York SpanielMonica Sunni Richardson MSW,LCSW [phone]

## 2015-08-24 NOTE — Progress Notes (Signed)
PT Cancellation Note  Patient Details Name: Devin Richmond MRN: [id number] DOB: 1965-04-21   Cancelled Treatment:    Reason Eval/Treat Not Completed: Patient declined, no reason specified. Treatment attempted; pt refused all PT despite encouragement noting he is too tired. Re attempt at a later time/date.    Kristeen MissHeidi Elizabeth Bishop, [place]PT[place] 08/24/2015, 4:13 PM

## 2015-08-25 LAB — GLUCOSE, CAPILLARY
GLUCOSE-CAPILLARY: 161 mg/dL — AB (ref 65–99)
GLUCOSE-CAPILLARY: 185 mg/dL — AB (ref 65–99)
Glucose-Capillary: 233 mg/dL — ABNORMAL HIGH (ref 65–99)

## 2015-08-25 MED ORDER — INSULIN DETEMIR 100 UNIT/ML ~~LOC~~ SOLN: 8.0000 [IU] | Freq: Every day | SUBCUTANEOUS | 11 refills | Status: DC

## 2015-08-25 MED ORDER — CHLORPROMAZINE HCL 10 MG PO TABS: 10.0000 mg | ORAL_TABLET | Freq: Three times a day (TID) | ORAL | 0 refills | Status: DC | PRN

## 2015-08-25 MED ORDER — INSULIN DETEMIR 100 UNIT/ML ~~LOC~~ SOLN: 5.0000 [IU] | Freq: Every day | SUBCUTANEOUS | 11 refills | Status: DC

## 2015-08-25 MED ORDER — LIDOCAINE-PRILOCAINE 2.5-2.5 % EX CREA: 1.0000 "application " | TOPICAL_CREAM | Freq: Every day | CUTANEOUS | 0 refills | Status: DC | PRN

## 2015-08-25 NOTE — Progress Notes (Signed)
[facility]  ROUNDING NOTE   Subjective:   Tolerated dialysis treatment yesterday.   Objective:  Vital signs in last 24 hours:  Temp:  [97.6 F (36.4 C)-98 F (36.7 C)] 97.9 F (36.6 C) (08/23 0606) Pulse Rate:  [95-102] 101 (08/23 1116) Resp:  [16-20] 18 (08/23 1116) BP: (112-142)/(70-87) 126/85 (08/23 1116) SpO2:  [96 %-100 %] 100 % (08/23 1116)  Weight change:  Filed Weights   08/21/15 1907 08/24/15 0912 08/24/15 1220  Weight: 100.4 kg (221 lb 5.5 oz) 99.8 kg (220 lb 0.3 oz) 98.6 kg (217 lb 6 oz)    Intake/Output: I/O last 3 completed shifts: In: 680 [P.O.:480; IV Piggyback:200] Out: 1200 [Other:1200]   Intake/Output this shift:  No intake/output data recorded.  Physical Exam: General: No acute distress  Head: Normocephalic, atraumatic. Moist oral mucosal membranes  Eyes: Anicteric  Neck: Supple, trachea midline  Lungs:  Clear to auscultation, normal effort  Heart: S1S2 no rubs  Abdomen:  Soft, nontender, BS present  Extremities: No peripheral edema, left foot wrapped  Neurologic: Nonfocal, moving all four extremities  Skin: No lesions  Access: Left upper extremity AV fistula     Basic Metabolic Panel:  Recent Labs Lab 08/22/15 0526  NA 136  K 4.1  CL 94*  CO2 30  GLUCOSE 191*  BUN 37*  CREATININE 8.59*  CALCIUM 7.7*    Liver Function Tests: No results for input(s): AST, ALT, ALKPHOS, BILITOT, PROT, ALBUMIN in the last 168 hours. No results for input(s): LIPASE, AMYLASE in the last 168 hours. No results for input(s): AMMONIA in the last 168 hours.  CBC:  Recent Labs Lab 08/19/15 0441 08/20/15 0550 08/21/15 0455 08/22/15 0526  WBC 21.5* 19.8* 17.3* 12.3*  HGB 11.7* 11.7* 11.4* 10.6*  HCT 35.3* 35.3* 34.7* 31.6*  MCV 95.1 95.5 96.8 95.6  PLT 239 258 268 286    Cardiac Enzymes: No results for input(s): CKTOTAL, CKMB, CKMBINDEX, TROPONINI in the last 168 hours.  BNP: Invalid input(s): POCBNP  CBG:  Recent Labs Lab  08/24/15 0724 08/24/15 1642 08/24/15 2111 08/25/15 0811 08/25/15 1156  GLUCAP 159* 124* 161* 185* 233*    Microbiology: Results for orders placed or performed during the hospital encounter of 08/17/15  Culture, blood (Routine X 2) w Reflex to ID Panel     Status: None   Collection Time: 08/17/15  6:10 PM  Result Value Ref Range Status   Specimen Description BLOOD  Final   Special Requests 4ML  Final   Culture NO GROWTH 5 DAYS  Final   Report Status 08/22/2015 FINAL  Final  Culture, blood (Routine X 2) w Reflex to ID Panel     Status: None   Collection Time: 08/17/15  6:40 PM  Result Value Ref Range Status   Specimen Description BLOOD  Final   Special Requests 4ML  Final   Culture NO GROWTH 5 DAYS  Final   Report Status 08/22/2015 FINAL  Final  MRSA PCR Screening     Status: None   Collection Time: 08/18/15  3:00 AM  Result Value Ref Range Status   MRSA by PCR NEGATIVE NEGATIVE Final    Comment:        The GeneXpert MRSA Assay (FDA approved for NASAL specimens only), is one component of a comprehensive MRSA colonization surveillance program. It is not intended to diagnose MRSA infection nor to guide or monitor treatment for MRSA infections.     Coagulation Studies: No results for input(s): LABPROT, INR  in the last 72 hours.  Urinalysis: No results for input(s): COLORURINE, LABSPEC, PHURINE, GLUCOSEU, HGBUR, BILIRUBINUR, KETONESUR, PROTEINUR, UROBILINOGEN, NITRITE, LEUKOCYTESUR in the last 72 hours.  Invalid input(s): APPERANCEUR    Imaging: Dg Chest 1 View  Result Date: 08/23/2015 CLINICAL DATA:  Hiccups EXAM: CHEST 1 VIEW COMPARISON:  08/17/2015 FINDINGS: Cardiac shadow is mildly enlarged but stable. The lungs are clear bilaterally. No acute infiltrate or sizable effusion is seen. No bony abnormality is noted. IMPRESSION: No active disease. Electronically Signed   By: Alcide CleverMark  Lukens M.D.   On: 08/23/2015 13:47     Medications:     . ampicillin-sulbactam  (UNASYN) IV  3 g Intravenous Q24H  . atorvastatin  80 mg Oral QHS  . atropine  1 drop Left Eye BID  . carvedilol  12.5 mg Oral BID WC  . cinacalcet  90 mg Oral Q breakfast  . escitalopram  10 mg Oral Daily  . famotidine  20 mg Oral Daily  . folic acid  500 mcg Oral Daily  . heparin  5,000 Units Subcutaneous Q8H  . insulin aspart  0-15 Units Subcutaneous TID WC  . insulin aspart  0-5 Units Subcutaneous QHS  . insulin detemir  5 Units Subcutaneous QHS  . lanthanum  1,000 mg Oral TID WC  . loratadine  10 mg Oral Daily  . meclizine  25 mg Oral BID  . mirtazapine  30 mg Oral QHS  . senna  2 tablet Oral Daily  . 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 End-stage renal disease TTS: with history of hyperkalemia - Continue TTS schedule - monitor potassium  2. Hypertension: blood pressure at goal.  - carvedilol  3. Secondary hyperparathyroidism.   -  Continue Renvela, Fosrenol, and Sensipar.  4. Anemia of chronic kidney disease.  - epo as outpatient.   LOS: 8 Jlynn Ly 8/23/201712:27 PM

## 2015-08-25 NOTE — Discharge Summary (Addendum)
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 Anemia   . CVA (cerebral infarction)   . Diabetes mellitus without complication ([name])   . Diabetic retinopathy ([name])   . Dialysis patient ([name])   . GERD (gastroesophageal reflux disease)   . Hyperlipidemia   . Hypertension   . Kidney disease   . 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 Pancreatitis   . Stroke [facility]([name])     HOSPITAL COURSE:   50 y.o. malewith a known history of End-stage renal disease on T-Th-S dialysis, neuropathy, stroke, peripheral vascular disease, diabetic retinopathy, hypertension, diabetes mellitus presents from group home secondary to worsening left great toe gangrene.  1 Sepsis: This was secondary to left great toe gangrene with no osteomyelitis on X ray.  He is status post angiogram and angioplasty of posterior tibial artery and left first toe amputation and first metatarsal ray amputation on 08/21/15. He was on Unasyn and changed to Augmentin 500 mg at bedtime and make sure it is AFTER DIALYSIS which is renally dosed as per ID consult at discharge.  He will need this treatment for 2 weeks postop. He will follow-up podiatry on Thursday.  Keep dressing dry.  2 hyperkalemia: This improved with dialysis. Nephrology consult appreciated.  3 end-stage renal disease on hemodialysis on Tue-Thur-sat dialysis: Patient will continue outpatient regimen. 4 diabetes mellitus: He will  continue Levemir (decreaed to 8 units due to poor appetitie)  and pre-meal insulin  and SSI With ADA diet.   5.  Depression: Continue Lexapro and Remeron.  6. Hiccups: Continue when necessary Thorazine.  Chest XRAY does not show elevation in diaphragm.   DISCHARGE CONDITIONS AND DIET:   Stable Diabetic diet renal diet  CONSULTS OBTAINED:  Treatment Team:  Renford DillsGregory G Schnier, MD Linus Galasodd Cline, DPM  DRUG ALLERGIES:   Allergies  Allergen Reactions  . Sulfa Antibiotics Other (See Comments)    unknown    DISCHARGE MEDICATIONS:   Current Discharge Medication List    START taking these medications   Details  amoxicillin-clavulanate (AUGMENTIN) 500-125 MG tablet Take 1 tablet (500 mg total) by mouth at night. AFTER DIALYSIS Qty: 10 tablet, Refills: 0    chlorproMAZINE (THORAZINE) 10 MG tablet Take 1 tablet (10 mg total) by mouth 3 (three) times daily as needed for hiccoughs. Qty: 30 tablet, Refills: 0    escitalopram (LEXAPRO) 10 MG tablet Take 1 tablet (10 mg total) by mouth daily. Qty: 30 tablet, Refills: 0      CONTINUE these medications which have CHANGED   Details  insulin aspart (NOVOLOG) 100 UNIT/ML injection Inject 2-10 Units into the skin 3 (three) times daily before meals. iCorrection coverage: Moderate (average weight, post-op)  CBG < 70: implement hypoglycemia protocol  CBG 70 - 120: 0 units  CBG 121 - 150: 2 units  CBG 151 - 200: 3 units  CBG 201 - 250: 5 units  CBG 251 - 300: 8 units  CBG 301 - 350: 11 units  CBG 351 - 400: 15 units  CBG >  400 call MD and obtain STAT lab verification Qty: 10 mL, Refills: 11    insulin detemir (LEVEMIR) 100 UNIT/ML injection Inject 0.08 mLs (8 Units total) into the skin at bedtime. Qty: 10 mL, Refills: 11    lidocaine-prilocaine (EMLA) cream Apply 1 application topically daily as needed (days for dialysis). Qty: 30 g, Refills: 0    meclizine (ANTIVERT) 25 MG tablet Take 1 tablet (25 mg total) by mouth 2 (two) times daily. Qty: 30 tablet, Refills: 0      CONTINUE these medications which have NOT  CHANGED   Details  atorvastatin (LIPITOR) 80 MG tablet Take 80 mg by mouth at bedtime.     atropine 1 % ophthalmic solution Place 1 drop into the left eye 2 (two) times daily at 10 AM and 5 PM.     carvedilol (COREG) 12.5 MG tablet Take 1 tablet (12.5 mg total) by mouth 2 (two) times daily. Qty: 60 tablet, Refills: 11    cinacalcet (SENSIPAR) 90 MG tablet Take 90 mg by mouth daily.    clopidogrel (PLAVIX) 75 MG tablet Take 75 mg by mouth daily.    folic acid (FOLVITE) 400 MCG tablet Take 400 mcg by mouth daily.     lanthanum (FOSRENOL) 1000 MG chewable tablet Chew 1,000 mg by mouth 3 (three) times daily with meals. Only takes if has meal    loratadine (CLARITIN) 10 MG tablet Take 10 mg by mouth daily.    mirtazapine (REMERON) 30 MG tablet Take 30 mg by mouth at bedtime.    ranitidine (ZANTAC) 150 MG tablet Take 150 mg by mouth daily as needed for heartburn.    sevelamer carbonate (RENVELA) 800 MG tablet Take 800 mg by mouth 3 (three) times daily with meals.      STOP taking these medications     cephALEXin (KEFLEX) 500 MG capsule               Today   CHIEF COMPLAINT:  Ready to leave hospital Hiccups back today   VITAL SIGNS:  Blood pressure (!) 142/87, pulse 98, temperature 97.9 F (36.6 C), temperature source Oral, resp. rate 16, height 6' (1.829 m), weight 98.6 kg (217 lb 6 oz), SpO2 96 %.   REVIEW OF SYSTEMS:  Review of Systems  Constitutional: Negative for chills, fever and malaise/fatigue.       Hiccups  HENT: Negative.  Negative for ear discharge, ear pain, hearing loss, nosebleeds and sore throat.   Eyes: Negative.  Negative for blurred vision and pain.  Respiratory: Negative.  Negative for cough, hemoptysis, shortness of breath and wheezing.   Cardiovascular: Negative.  Negative for chest pain, palpitations and leg swelling.  Gastrointestinal: Negative.  Negative for abdominal pain, blood in stool, diarrhea, nausea and vomiting.  Genitourinary:  Negative.  Negative for dysuria.  Musculoskeletal: Negative.  Negative for back pain.  Skin:       Foot wrapped  Neurological: Negative for dizziness, tremors, speech change, focal weakness, seizures and headaches.       Old CVA  Endo/Heme/Allergies: Negative.  Does not bruise/bleed easily.  Psychiatric/Behavioral: Negative.  Negative for depression, hallucinations and suicidal ideas.     PHYSICAL EXAMINATION:  GENERAL:  50 y.o.-year-old patient lying in the bed with no acute distress.  NECK:  Supple, no jugular venous distention. No thyroid enlargement, no tenderness.  LUNGS: Normal breath sounds bilaterally, no wheezing, rales,rhonchi  No use of accessory muscles of respiration.  CARDIOVASCULAR: S1, S2 normal. No murmurs, rubs, or gallops.  ABDOMEN: Soft, non-tender, non-distended. Bowel sounds present. No organomegaly or mass.  EXTREMITIES: No pedal edema, cyanosis, or clubbing.  PSYCHIATRIC: The patient is alert and oriented x 3.  SKIN: No obvious rash, lesion, or ulcer.  NEURO: slow speech and weakness from previous CVA  DATA REVIEW:   CBC  Recent Labs Lab 08/22/15 0526  WBC 12.3*  HGB 10.6*  HCT 31.6*  PLT 286    Chemistries   Recent Labs Lab 08/22/15 0526  NA 136  K 4.1  CL 94*  CO2 30  GLUCOSE 191*  BUN 37*  CREATININE 8.59*  CALCIUM 7.7*    Cardiac Enzymes No results for input(s): TROPONINI in the last 168 hours.  Microbiology Results  @[email or web address] @  RADIOLOGY:  Dg Chest 1 View  Result Date: 08/23/2015 CLINICAL DATA:  Hiccups EXAM: CHEST 1 VIEW COMPARISON:  08/17/2015 FINDINGS: Cardiac shadow is mildly enlarged but stable. The lungs are clear bilaterally. No acute infiltrate or sizable effusion is seen. No bony abnormality is noted. IMPRESSION: No active disease. Electronically Signed   By: Alcide CleverMark  Lukens M.D.   On: 08/23/2015 13:47      Management plans discussed with the patient and he is in agreement. Stable for discharge SNF  Patient  should follow up with PCP and PODIATRY  CODE STATUS:     Code Status Orders        Start     Ordered   08/18/15 0037  Full code  Continuous     08/18/15 0036    Code Status History    Date Active Date Inactive Code Status Order ID Comments User Context   This patient has a current code status but no historical code status.      TOTAL TIME TAKING CARE OF THIS PATIENT: 36 minutes.    Note: This dictation was prepared with Dragon dictation along with smaller phrase technology. Any transcriptional errors that result from this process are unintentional.  Chundra Sauerwein M.D on 08/25/2015 at 9:01 AM  Between 7am to 6pm - Pager - [phone] After 6pm go to [email or web address] - Social research officer, governmentpassword EPAS [facility]  [facility] [place] [facility]  Office  [phone]  CC: Primary care physician; Leotis ShamesSingh,Jasmine, MD

## 2015-08-25 NOTE — Progress Notes (Signed)
Speech Language Pathology Treatment: Dysphagia  Patient Details Name: Devin Richmond MRN: [id number] DOB: [birth year] Today's Date: 08/25/2015 Time: 0915-1000 SLP Time Calculation (min) (ACUTE ONLY): 45 min  Assessment / Plan / Recommendation Clinical Impression  Pt appears to be tolerating the recommended diet to include soft, cooked foods cut and moistened w/ gravy/condiments. Pt does require min extra time for lingual sweeping and f/u swallow to fully clear as oral movements and bolus management appear min slower overall. Suspect this is related to previous CVA. W/ consumption of thin liquids, no overt s/s of aspiration have been noted by pt/staff or SLP during tx session. Pt can feed self but benefits from support w/ tray setup. Recommend continue w/ current diet w/ general aspiration precautions and strategies for oral clearing; recommend Meds in Puree - Crushed as able. Recommend support at all meals by NSG staff at SNF for tray setup and assistance w/ feeding as needed.   HPI HPI: Pt is a 50 y/o male with multiple medical issues including CVAs, HTN, DM and a known history of end-stage renal disease on T-Th-S dialysis, neuropathy, stroke, peripheral vascular disease, diabetic retinopathy, hypertension, diabetes mellitus presents from group home secondary to worsening left great toe gangrene. Pt has been feeling weaker and missed dialysis on day of admission. Pt's speech is slower and overall bolus management during oral intake is slower since previous CVA per Caregiver.      SLP Plan  All goals met (recommend support at all meals by NSG staff at [facility])     Recommendations  Diet recommendations: Dysphagia 3 (mechanical soft);Thin liquid (chopped and gravy added) Liquids provided via: Straw;Cup Medication Administration: Crushed with puree Supervision: Patient able to self feed;Intermittent supervision to cue for compensatory strategies (full setup assistance) Compensations: Minimize  environmental distractions;Slow rate;Small sips/bites;Lingual sweep for clearance of pocketing;Follow solids with liquid Postural Changes and/or Swallow Maneuvers: Out of bed for meals;Seated upright 90 degrees;Upright 30-60 min after meal             General recommendations:  (f/u by Dietician as needed) Oral Care Recommendations: Oral care BID;Staff/trained caregiver to provide oral care Follow up Recommendations: None Plan: All goals met (recommend support at all meals by NSG staff at SNF)     [place], [place], CCC-SLP  [facility] 08/25/2015, 10:19 AM

## 2015-08-25 NOTE — Progress Notes (Signed)
Pt to be discharged per MD order. IV removed. Packet for SNF discharge prepared by social work. Called report to [facility]. Awaiting daughter because she wants to become POA. After daughter arrives will call EMS.

## 2015-08-25 NOTE — Clinical Social Work Note (Signed)
Patient discharging today to go to [facility]. Pasrr obtained. Patient's daughter is coming up to compete an HPOA with patient and then wishes transport via EMS. Discharge information sent to [facility] today and Jomarie LongsJoseph at [facility] is aware. York SpanielMonica Semya Klinke MSW,LCSW [phone]

## 2015-08-25 NOTE — Clinical Social Work Placement (Signed)
   CLINICAL SOCIAL WORK PLACEMENT  NOTE  Date:  08/25/2015  Patient Details  Name: Devin Richmond MRN: [id number] Date of Birth: 12/14/1965  Clinical Social Work is seeking post-discharge placement for this patient at the Skilled  Nursing Facility level of care (*CSW will initial, date and re-position this form in  chart as items are completed):  Yes   Patient/family provided with [place] Clinical Social Work Department's list of facilities offering this level of care within the geographic area requested by the patient (or if unable, by the patient's family).  Yes   Patient/family informed of their freedom to choose among providers that offer the needed level of care, that participate in Medicare, Medicaid or managed care program needed by the patient, have an available bed and are willing to accept the patient.  Yes   Patient/family informed of [facility]'s ownership interest in [facility] and [facility], as well as of the fact that they are under no obligation to receive care at these facilities.  PASRR submitted to EDS on 08/21/15     PASRR number received on 08/25/15     Existing PASRR number confirmed on       FL2 transmitted to all facilities in geographic area requested by pt/family on       FL2 transmitted to all facilities within larger geographic area on       Patient informed that his/her managed care company has contracts with or will negotiate with certain facilities, including the following:        Yes   Patient/family informed of bed offers received.  Patient chooses bed at  [facility])     Physician recommends and patient chooses bed at  [facility](SNF)    Patient to be transferred to  ([facility]) on 08/25/15.  Patient to be transferred to facility by  (EMS)     Patient family notified on 08/25/15 of transfer.  Name of family member notified:   (daughter: Wynona CanesChristine)     PHYSICIAN       Additional Comment:     _______________________________________________ York SpanielMonica Atiana Levier, LCSW 08/25/2015, 9:59 AM

## 2015-09-11 ENCOUNTER — Encounter: Payer: Self-pay | Admitting: Emergency Medicine

## 2015-09-11 ENCOUNTER — Inpatient Hospital Stay
Admission: EM | Admit: 2015-09-11 | Discharge: 2015-09-14 | DRG: 871 | Disposition: A | Discharge status: Skilled Nursing Facility | Payer: Medicare Other | Attending: Specialist | Admitting: Specialist

## 2015-09-11 ENCOUNTER — Emergency Department: Payer: Medicare Other

## 2015-09-11 DIAGNOSIS — K219 Gastro-esophageal reflux disease without esophagitis: Secondary | ICD-10-CM | POA: Diagnosis present

## 2015-09-11 DIAGNOSIS — Z66 Do not resuscitate: Secondary | ICD-10-CM | POA: Diagnosis present

## 2015-09-11 DIAGNOSIS — Z789 Other specified health status: Secondary | ICD-10-CM | POA: Diagnosis not present

## 2015-09-11 DIAGNOSIS — E11319 Type 2 diabetes mellitus with unspecified diabetic retinopathy without macular edema: Secondary | ICD-10-CM | POA: Diagnosis present

## 2015-09-11 DIAGNOSIS — N186 End stage renal disease: Secondary | ICD-10-CM

## 2015-09-11 DIAGNOSIS — Z992 Dependence on renal dialysis: Secondary | ICD-10-CM | POA: Diagnosis not present

## 2015-09-11 DIAGNOSIS — I120 Hypertensive chronic kidney disease with stage 5 chronic kidney disease or end stage renal disease: Secondary | ICD-10-CM | POA: Diagnosis present

## 2015-09-11 DIAGNOSIS — E1142 Type 2 diabetes mellitus with diabetic polyneuropathy: Secondary | ICD-10-CM | POA: Diagnosis present

## 2015-09-11 DIAGNOSIS — J9601 Acute respiratory failure with hypoxia: Secondary | ICD-10-CM | POA: Diagnosis present

## 2015-09-11 DIAGNOSIS — K861 Other chronic pancreatitis: Secondary | ICD-10-CM | POA: Diagnosis present

## 2015-09-11 DIAGNOSIS — N2581 Secondary hyperparathyroidism of renal origin: Secondary | ICD-10-CM | POA: Diagnosis present

## 2015-09-11 DIAGNOSIS — Z515 Encounter for palliative care: Secondary | ICD-10-CM

## 2015-09-11 DIAGNOSIS — Z8673 Personal history of transient ischemic attack (TIA), and cerebral infarction without residual deficits: Secondary | ICD-10-CM | POA: Diagnosis not present

## 2015-09-11 DIAGNOSIS — F329 Major depressive disorder, single episode, unspecified: Secondary | ICD-10-CM | POA: Diagnosis present

## 2015-09-11 DIAGNOSIS — E1152 Type 2 diabetes mellitus with diabetic peripheral angiopathy with gangrene: Secondary | ICD-10-CM | POA: Diagnosis present

## 2015-09-11 DIAGNOSIS — A4151 Sepsis due to Escherichia coli [E. coli]: Principal | ICD-10-CM

## 2015-09-11 DIAGNOSIS — Z882 Allergy status to sulfonamides status: Secondary | ICD-10-CM

## 2015-09-11 DIAGNOSIS — N390 Urinary tract infection, site not specified: Secondary | ICD-10-CM | POA: Diagnosis present

## 2015-09-11 DIAGNOSIS — E1122 Type 2 diabetes mellitus with diabetic chronic kidney disease: Secondary | ICD-10-CM | POA: Diagnosis present

## 2015-09-11 DIAGNOSIS — R509 Fever, unspecified: Secondary | ICD-10-CM

## 2015-09-11 DIAGNOSIS — G9341 Metabolic encephalopathy: Secondary | ICD-10-CM | POA: Diagnosis present

## 2015-09-11 DIAGNOSIS — R000 Tachycardia, unspecified: Secondary | ICD-10-CM | POA: Diagnosis present

## 2015-09-11 DIAGNOSIS — Z79899 Other long term (current) drug therapy: Secondary | ICD-10-CM | POA: Diagnosis not present

## 2015-09-11 DIAGNOSIS — Z794 Long term (current) use of insulin: Secondary | ICD-10-CM | POA: Diagnosis not present

## 2015-09-11 DIAGNOSIS — D631 Anemia in chronic kidney disease: Secondary | ICD-10-CM | POA: Diagnosis present

## 2015-09-11 DIAGNOSIS — R0600 Dyspnea, unspecified: Secondary | ICD-10-CM | POA: Diagnosis present

## 2015-09-11 DIAGNOSIS — Z1612 Extended spectrum beta lactamase (ESBL) resistance: Secondary | ICD-10-CM | POA: Diagnosis present

## 2015-09-11 DIAGNOSIS — Z823 Family history of stroke: Secondary | ICD-10-CM | POA: Diagnosis not present

## 2015-09-11 DIAGNOSIS — Z89412 Acquired absence of left great toe: Secondary | ICD-10-CM

## 2015-09-11 DIAGNOSIS — A419 Sepsis, unspecified organism: Secondary | ICD-10-CM | POA: Diagnosis not present

## 2015-09-11 LAB — BLOOD CULTURE ID PANEL (REFLEXED)
Acinetobacter baumannii: NOT DETECTED
CANDIDA PARAPSILOSIS: NOT DETECTED
CANDIDA TROPICALIS: NOT DETECTED
Candida albicans: NOT DETECTED
Candida glabrata: NOT DETECTED
Candida krusei: NOT DETECTED
Carbapenem resistance: NOT DETECTED
Enterobacter cloacae complex: NOT DETECTED
Enterobacteriaceae species: DETECTED — AB
Enterococcus species: NOT DETECTED
Escherichia coli: DETECTED — AB
HAEMOPHILUS INFLUENZAE: NOT DETECTED
KLEBSIELLA PNEUMONIAE: NOT DETECTED
Klebsiella oxytoca: NOT DETECTED
Listeria monocytogenes: NOT DETECTED
NEISSERIA MENINGITIDIS: NOT DETECTED
PROTEUS SPECIES: NOT DETECTED
Pseudomonas aeruginosa: NOT DETECTED
SERRATIA MARCESCENS: NOT DETECTED
STAPHYLOCOCCUS SPECIES: NOT DETECTED
STREPTOCOCCUS AGALACTIAE: NOT DETECTED
STREPTOCOCCUS SPECIES: NOT DETECTED
Staphylococcus aureus (BCID): NOT DETECTED
Streptococcus pneumoniae: NOT DETECTED
Streptococcus pyogenes: NOT DETECTED

## 2015-09-11 LAB — COMPREHENSIVE METABOLIC PANEL
ALBUMIN: 3 g/dL — AB (ref 3.5–5.0)
ALK PHOS: 136 U/L — AB (ref 38–126)
ALK PHOS: 142 U/L — AB (ref 38–126)
ALT: 19 U/L (ref 17–63)
ALT: 26 U/L (ref 17–63)
ANION GAP: 16 — AB (ref 5–15)
AST: 43 U/L — ABNORMAL HIGH (ref 15–41)
AST: 42 U/L — ABNORMAL HIGH (ref 15–41)
Albumin: 2.6 g/dL — ABNORMAL LOW (ref 3.5–5.0)
Anion gap: 9 (ref 5–15)
BILIRUBIN TOTAL: 0.9 mg/dL (ref 0.3–1.2)
BUN: 21 mg/dL — ABNORMAL HIGH (ref 6–20)
BUN: 25 mg/dL — ABNORMAL HIGH (ref 6–20)
CALCIUM: 9.2 mg/dL (ref 8.9–10.3)
CALCIUM: 8.6 mg/dL — AB (ref 8.9–10.3)
CHLORIDE: 91 mmol/L — AB (ref 101–111)
CO2: 28 mmol/L (ref 22–32)
CO2: 27 mmol/L (ref 22–32)
CREATININE: 8.43 mg/dL — AB (ref 0.61–1.24)
Chloride: 98 mmol/L — ABNORMAL LOW (ref 101–111)
Creatinine, Ser: 7.84 mg/dL — ABNORMAL HIGH (ref 0.61–1.24)
GFR calc non Af Amer: 7 mL/min — ABNORMAL LOW (ref >60)
GFR calc non Af Amer: 6 mL/min — ABNORMAL LOW (ref >60)
GFR, EST AFRICAN AMERICAN: 8 mL/min — AB (ref >60)
GFR, EST AFRICAN AMERICAN: 7 mL/min — AB (ref >60)
GLUCOSE: 176 mg/dL — AB (ref 65–99)
GLUCOSE: 143 mg/dL — AB (ref 65–99)
POTASSIUM: 4 mmol/L (ref 3.5–5.1)
Potassium: 3.8 mmol/L (ref 3.5–5.1)
SODIUM: 135 mmol/L (ref 135–145)
SODIUM: 134 mmol/L — AB (ref 135–145)
TOTAL PROTEIN: 7.1 g/dL (ref 6.5–8.1)
Total Bilirubin: 0.9 mg/dL (ref 0.3–1.2)
Total Protein: 8.7 g/dL — ABNORMAL HIGH (ref 6.5–8.1)

## 2015-09-11 LAB — URINALYSIS COMPLETE WITH MICROSCOPIC (ARMC ONLY)
Bilirubin Urine: NEGATIVE
Glucose, UA: >500 mg/dL — AB
Ketones, ur: NEGATIVE mg/dL
Nitrite: NEGATIVE
PH: 9 — AB (ref 5.0–8.0)
PROTEIN: 100 mg/dL — AB
Specific Gravity, Urine: 1.007 (ref 1.005–1.030)
TRANS EPITHEL UA: 1

## 2015-09-11 LAB — CBC WITH DIFFERENTIAL/PLATELET
BASOS ABS: 0 10*3/uL (ref 0–0.1)
Basophils Relative: 1 %
EOS PCT: 1 %
Eosinophils Absolute: 0.1 10*3/uL (ref 0–0.7)
HCT: 33 % — ABNORMAL LOW (ref 40.0–52.0)
HEMOGLOBIN: 10.9 g/dL — AB (ref 13.0–18.0)
LYMPHS ABS: 0.7 10*3/uL — AB (ref 1.0–3.6)
LYMPHS PCT: 7 %
MCH: 31.7 pg (ref 26.0–34.0)
MCHC: 33 g/dL (ref 32.0–36.0)
MCV: 96.2 fL (ref 80.0–100.0)
Monocytes Absolute: 0.1 10*3/uL — ABNORMAL LOW (ref 0.2–1.0)
Monocytes Relative: 1 %
NEUTROS ABS: 8.3 10*3/uL — AB (ref 1.4–6.5)
NEUTROS PCT: 90 %
PLATELETS: 190 10*3/uL (ref 150–440)
RBC: 3.43 MIL/uL — AB (ref 4.40–5.90)
RDW: 16.7 % — ABNORMAL HIGH (ref 11.5–14.5)
WBC: 9.2 10*3/uL (ref 3.8–10.6)

## 2015-09-11 LAB — GLUCOSE, CAPILLARY
GLUCOSE-CAPILLARY: 147 mg/dL — AB (ref 65–99)
GLUCOSE-CAPILLARY: 161 mg/dL — AB (ref 65–99)
GLUCOSE-CAPILLARY: 182 mg/dL — AB (ref 65–99)
GLUCOSE-CAPILLARY: 183 mg/dL — AB (ref 65–99)
GLUCOSE-CAPILLARY: 90 mg/dL (ref 65–99)

## 2015-09-11 LAB — CBC
HCT: 28.9 % — ABNORMAL LOW (ref 40.0–52.0)
HEMOGLOBIN: 9.6 g/dL — AB (ref 13.0–18.0)
MCH: 31.7 pg (ref 26.0–34.0)
MCHC: 33.2 g/dL (ref 32.0–36.0)
MCV: 95.6 fL (ref 80.0–100.0)
PLATELETS: 164 10*3/uL (ref 150–440)
RBC: 3.02 MIL/uL — AB (ref 4.40–5.90)
RDW: 16.3 % — ABNORMAL HIGH (ref 11.5–14.5)
WBC: 15.7 10*3/uL — ABNORMAL HIGH (ref 3.8–10.6)

## 2015-09-11 LAB — TROPONIN I: TROPONIN I: 0.03 ng/mL — AB (ref <0.03)

## 2015-09-11 LAB — PROTIME-INR
INR: 1.11
PROTHROMBIN TIME: 14.4 s (ref 11.4–15.2)

## 2015-09-11 LAB — PHOSPHORUS: PHOSPHORUS: 4.1 mg/dL (ref 2.5–4.6)

## 2015-09-11 LAB — LACTIC ACID, PLASMA
LACTIC ACID, VENOUS: 2.1 mmol/L — AB (ref 0.5–1.9)
LACTIC ACID, VENOUS: 2.4 mmol/L — AB (ref 0.5–1.9)
Lactic Acid, Venous: 4.9 mmol/L (ref 0.5–1.9)
Lactic Acid, Venous: 1.4 mmol/L (ref 0.5–1.9)

## 2015-09-11 LAB — TYPE AND SCREEN
ABO/RH(D): B POS
Antibody Screen: NEGATIVE

## 2015-09-11 LAB — MAGNESIUM: MAGNESIUM: 1.6 mg/dL — AB (ref 1.7–2.4)

## 2015-09-11 LAB — CORTISOL: CORTISOL PLASMA: 41.6 ug/dL

## 2015-09-11 LAB — APTT: APTT: 40 s — AB (ref 24–36)

## 2015-09-11 LAB — MRSA PCR SCREENING: MRSA by PCR: NEGATIVE

## 2015-09-11 LAB — PROCALCITONIN: PROCALCITONIN: 4.92 ng/mL

## 2015-09-11 MED ORDER — ALBUTEROL SULFATE (2.5 MG/3ML) 0.083% IN NEBU: 2.5000 mg | INHALATION_SOLUTION | RESPIRATORY_TRACT | Status: DC | PRN

## 2015-09-11 MED ORDER — VANCOMYCIN HCL IN DEXTROSE 1-5 GM/200ML-% IV SOLN: 1000.0000 mg | INTRAVENOUS | Status: DC

## 2015-09-11 MED ORDER — SODIUM CHLORIDE 0.9 % IV BOLUS (SEPSIS)
1000.0000 mL | Freq: Once | INTRAVENOUS | Status: AC
  Administered 2015-09-11: 1000 mL via INTRAVENOUS

## 2015-09-11 MED ORDER — CARVEDILOL 3.125 MG PO TABS
3.1250 mg | ORAL_TABLET | Freq: Two times a day (BID) | ORAL | Status: DC
  Administered 2015-09-11 – 2015-09-13 (×4): 3.125 mg via ORAL
  Filled 2015-09-11 (×5): qty 1

## 2015-09-11 MED ORDER — DEXTROSE 5 % IV SOLN
1.0000 g | INTRAVENOUS | Status: DC
  Administered 2015-09-11: 1 g via INTRAVENOUS
  Filled 2015-09-11 (×2): qty 1

## 2015-09-11 MED ORDER — FAMOTIDINE 20 MG PO TABS
20.0000 mg | ORAL_TABLET | Freq: Every day | ORAL | Status: DC
  Administered 2015-09-11 – 2015-09-13 (×2): 20 mg via ORAL
  Filled 2015-09-11 (×3): qty 1

## 2015-09-11 MED ORDER — RISAQUAD PO CAPS
2.0000 | ORAL_CAPSULE | Freq: Every day | ORAL | Status: DC
  Administered 2015-09-11 – 2015-09-13 (×2): 2 via ORAL
  Filled 2015-09-11 (×4): qty 2

## 2015-09-11 MED ORDER — VANCOMYCIN HCL IN DEXTROSE 1-5 GM/200ML-% IV SOLN
1000.0000 mg | INTRAVENOUS | Status: DC
  Filled 2015-09-11: qty 200

## 2015-09-11 MED ORDER — PIPERACILLIN-TAZOBACTAM 3.375 G IVPB
3.3750 g | Freq: Once | INTRAVENOUS | Status: AC
  Administered 2015-09-11: 3.375 g via INTRAVENOUS
  Filled 2015-09-11: qty 50

## 2015-09-11 MED ORDER — INSULIN ASPART 100 UNIT/ML ~~LOC~~ SOLN
0.0000 [IU] | SUBCUTANEOUS | Status: DC
  Administered 2015-09-11 (×2): 3 [IU] via SUBCUTANEOUS
  Filled 2015-09-11 (×2): qty 3

## 2015-09-11 MED ORDER — VANCOMYCIN HCL IN DEXTROSE 1-5 GM/200ML-% IV SOLN
1000.0000 mg | Freq: Once | INTRAVENOUS | Status: AC
  Administered 2015-09-11: 1000 mg via INTRAVENOUS
  Filled 2015-09-11: qty 200

## 2015-09-11 MED ORDER — VANCOMYCIN HCL IN DEXTROSE 750-5 MG/150ML-% IV SOLN
750.0000 mg | INTRAVENOUS | Status: DC
  Filled 2015-09-11: qty 150

## 2015-09-11 MED ORDER — INSULIN DETEMIR 100 UNIT/ML ~~LOC~~ SOLN
10.0000 [IU] | Freq: Every day | SUBCUTANEOUS | Status: DC
  Administered 2015-09-11: 10 [IU] via SUBCUTANEOUS
  Filled 2015-09-11 (×3): qty 0.1

## 2015-09-11 MED ORDER — ACETAMINOPHEN 325 MG PO TABS
650.0000 mg | ORAL_TABLET | ORAL | Status: DC | PRN
  Administered 2015-09-11 – 2015-09-12 (×2): 650 mg via ORAL
  Filled 2015-09-11 (×2): qty 2

## 2015-09-11 MED ORDER — ACETAMINOPHEN 500 MG PO TABS
ORAL_TABLET | ORAL | Status: AC
  Administered 2015-09-11: 1000 mg via ORAL
  Filled 2015-09-11: qty 2

## 2015-09-11 MED ORDER — SODIUM CHLORIDE 0.9 % IV SOLN: 250.0000 mL | INTRAVENOUS | Status: DC | PRN

## 2015-09-11 MED ORDER — BACID PO TABS
2.0000 | ORAL_TABLET | Freq: Two times a day (BID) | ORAL | Status: DC
  Filled 2015-09-11: qty 2

## 2015-09-11 MED ORDER — ACETAMINOPHEN 500 MG PO TABS
1000.0000 mg | ORAL_TABLET | Freq: Once | ORAL | Status: AC
  Administered 2015-09-11: 1000 mg via ORAL

## 2015-09-11 MED ORDER — DEXTROSE 5 % IV SOLN
2.0000 g | INTRAVENOUS | Status: DC
  Filled 2015-09-11: qty 2

## 2015-09-11 MED ORDER — ORAL CARE MOUTH RINSE
15.0000 mL | Freq: Two times a day (BID) | OROMUCOSAL | Status: DC
  Administered 2015-09-11: 15 mL via OROMUCOSAL

## 2015-09-11 MED ORDER — SODIUM CHLORIDE 0.9 % IV SOLN
INTRAVENOUS | Status: DC
  Administered 2015-09-11: 12:00:00 via INTRAVENOUS

## 2015-09-11 MED ORDER — METOPROLOL TARTRATE 5 MG/5ML IV SOLN: 5.0000 mg | INTRAVENOUS | Status: DC | PRN

## 2015-09-11 MED ORDER — ONDANSETRON HCL 4 MG/2ML IJ SOLN: 4.0000 mg | Freq: Four times a day (QID) | INTRAMUSCULAR | Status: DC | PRN

## 2015-09-11 MED ORDER — INSULIN ASPART 100 UNIT/ML ~~LOC~~ SOLN
0.0000 [IU] | Freq: Three times a day (TID) | SUBCUTANEOUS | Status: DC
  Administered 2015-09-11: 3 [IU] via SUBCUTANEOUS
  Filled 2015-09-11: qty 3

## 2015-09-11 MED ORDER — HEPARIN SODIUM (PORCINE) 5000 UNIT/ML IJ SOLN
5000.0000 [IU] | Freq: Two times a day (BID) | INTRAMUSCULAR | Status: DC
  Administered 2015-09-11 – 2015-09-13 (×2): 5000 [IU] via SUBCUTANEOUS
  Filled 2015-09-11 (×3): qty 1

## 2015-09-11 NOTE — Progress Notes (Signed)
Per pt's MAR he was supposed to receive IV vancomycin divided into two doses, but [facility]. I called pharmacy and explained the situation, they did a chart review over the phone and Melissa (pharmacist) said he was supposed to receive 750mg  of vanc prior to dialysis and then 1G of vanc after dialysis. He did not finish his treatment today, and because of this she recommends that he receive 1G of IV vanc. I called Dr. Dellie CatholicSommers as he is the intensivist on call and he is in agreement with her recommendation. Will infuse 1G of IV vanc per MD.

## 2015-09-11 NOTE — Progress Notes (Signed)
[facility]  ROUNDING NOTE   Subjective:  Patient sent to [facility] under our instruction for fevers, chills, and tachycardia. The patient had recent left first toe amputation. This site appears to be healing quite well and no drainage was noted from this. Thus far the patient has been administered Zosyn, vancomycin, and cefepime. Patient has been transitioned to the critical care unit.  Objective:  Vital signs in last 24 hours:  Temp:  [102.2 F (39 C)-103.2 F (39.6 C)] 102.4 F (39.1 C) (09/09 1128) Pulse Rate:  [126-161] 128 (09/09 1128) Resp:  [17-44] 17 (09/09 1128) BP: (129-164)/(70-102) 135/88 (09/09 1128) SpO2:  [89 %-100 %] 100 % (09/09 1128) Weight:  [86.7 kg (191 lb 3.2 oz)-88.7 kg (195 lb 8.8 oz)] 88.7 kg (195 lb 8.8 oz) (09/09 1128)  Weight change:  Filed Weights   09/11/15 0727 09/11/15 1128  Weight: 86.7 kg (191 lb 3.2 oz) 88.7 kg (195 lb 8.8 oz)    Intake/Output: No intake/output data recorded.   Intake/Output this shift:  Total I/O In: 3250 [I.V.:3250] Out: 12 [Urine:10; Stool:2]  Physical Exam: General: No acute distress  Head: Normocephalic, atraumatic. Moist oral mucosal membranes  Eyes: Anicteric  Neck: Supple, trachea midline  Lungs:  Clear to auscultation, normal effort  Heart: S1S2 tachycardic no rubs  Abdomen:  Soft, nontender, BS present  Extremities:  peripheral edema.  Neurologic: Nonfocal, moving all four extremities  Skin: No lesions  Access: LUE AVF, non tender, good bruit and thrill    Basic Metabolic Panel:  Recent Labs Lab 09/11/15 0737  NA 135  K 4.0  CL 91*  CO2 28  GLUCOSE 176*  BUN 21*  CREATININE 7.84*  CALCIUM 9.2    Liver Function Tests:  Recent Labs Lab 09/11/15 0737  AST 43*  ALT 19  ALKPHOS 136*  BILITOT 0.9  PROT 8.7*  ALBUMIN 3.0*   No results for input(s): LIPASE, AMYLASE in the last 168 hours. No results for input(s): AMMONIA in the last 168  hours.  CBC:  Recent Labs Lab 09/11/15 0737  WBC 9.2  NEUTROABS 8.3*  HGB 10.9*  HCT 33.0*  MCV 96.2  PLT 190    Cardiac Enzymes:  Recent Labs Lab 09/11/15 0737  TROPONINI 0.03*    BNP: Invalid input(s): POCBNP  CBG:  Recent Labs Lab 09/11/15 0728 09/11/15 1136  GLUCAP 147* 182*    Microbiology: Results for orders placed or performed during the hospital encounter of 09/11/15  Culture, blood (Routine x 2)     Status: None (Preliminary result)   Collection Time: 09/11/15  7:37 AM  Result Value Ref Range Status   Specimen Description BLOOD LEFT ANTECUBITAL  Final   Special Requests BOTTLES DRAWN AEROBIC AND ANAEROBIC  8CC  Final   Culture NO GROWTH <12 HOURS  Final   Report Status PENDING  Incomplete  Culture, blood (Routine x 2)     Status: None (Preliminary result)   Collection Time: 09/11/15  7:37 AM  Result Value Ref Range Status   Specimen Description BLOOD LEFT ARM  Final   Special Requests   Final    BOTTLES DRAWN AEROBIC AND ANAEROBIC  AER 8CC ANA 4CC   Culture NO GROWTH <12 HOURS  Final   Report Status PENDING  Incomplete    Coagulation Studies:  Recent Labs  09/11/15 0737  LABPROT 14.4  INR 1.11    Urinalysis: No results for input(s): COLORURINE, LABSPEC, PHURINE, GLUCOSEU, HGBUR, BILIRUBINUR, KETONESUR, PROTEINUR, UROBILINOGEN,  NITRITE, LEUKOCYTESUR in the last 72 hours.  Invalid input(s): APPERANCEUR    Imaging: Dg Chest Portable 1 View  Result Date: 09/11/2015 CLINICAL DATA:  Patient with altered mental status, fever and tachycardia. EXAM: PORTABLE CHEST 1 VIEW COMPARISON:  Chest radiograph 08/23/2015 FINDINGS: Monitoring leads overlie the patient. Stable cardiac and mediastinal contours. Elevation of the right hemidiaphragm. No consolidative pulmonary opacities. No pleural effusion or pneumothorax. IMPRESSION: Elevation right hemidiaphragm.  No acute cardiopulmonary process. Electronically Signed   By: Annia Beltrew  Davis M.D.   On: 09/11/2015  08:19     Medications:   . sodium chloride     . acidophilus  2 capsule Oral Daily  . carvedilol  3.125 mg Oral BID WC  . ceFEPime (MAXIPIME) IV  1 g Intravenous Q24H  . famotidine  20 mg Oral Daily  . insulin aspart  0-15 Units Subcutaneous TID AC & HS  . insulin detemir  10 Units Subcutaneous QHS  . mouth rinse  15 mL Mouth Rinse BID  . vancomycin  1,000 mg Intravenous Q T,Th,Sa-HD  . vancomycin  750 mg Intravenous STAT   sodium chloride, acetaminophen, albuterol, metoprolol, ondansetron (ZOFRAN) IV  Assessment/ Plan:  50 y.o. 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  [facility]/TTS  1.  ESRD on HD TTS: Patient had a partial dialysis treatment today. Given tachycardia now we will hold off on additional dialysis treatment. We will reassess the patient for need of dialysis tomorrow.  2. Fever/chills/suspected sepsis: Source of infection not entirely clear at the moment. The patient's prior surgical site at his left first toe amputation appears to be healing nicely. No drainage from this area. Patient is on broad-spectrum anabiotic's now. Await culture data.  3. Anemia of chronic kidney disease. Hemoglobin currently 10.9. We will likely resume Epogen with dialysis.  4. Secondary hyperparathyroidism.  The patient was previously on Sensipar 90 mg by mouth daily and Renvela 800 mg by mouth 3 times a day. We will likely resume this once the patient has stabilized.  5. Thanks for consultation. We will continue to follow along with you closely.   LOS: 0 Aishani Kalis 9/9/201712:01 PM

## 2015-09-11 NOTE — ED Notes (Signed)
Code SEPSIS called to [facility] @ 7:25am

## 2015-09-11 NOTE — ED Triage Notes (Signed)
[facility] for AMS, Fever, and rapid heart rate. Per EMS patient heart rate was 160-165, O2 sat in the 80's on room air. Patient is dialysis patient, only received 45 minutes of treatment today.

## 2015-09-11 NOTE — Progress Notes (Signed)
Advanced care planning:  Called to patient's bedside, notified by ICU RN daughter's arrival, daughter has indicated that the patient as stated in the past that he did not want to be resuscitated.   Discussed case with pt and pt's daughter in the presence of the ICU RN.  I explained the patient has sepsis and septic shock from an uncertain etiology. The patient's daughter expressed that the patient has experienced a progressive downward course over the past few months. And has desired not to continue aggressive therapies because he is "tired". The patient had apparently stopped all medications at home because of the symptoms. I did ask patient about these, he indicates that he is indeed tired of taking medications and going through "everything". The patient states that he does not want to be kept alive on life sustaining medications or aggressive interventions. I did specifically ask patient whether he would like to continue dialysis, and I explained to him that if he stopped dialysis he would pass away, he did state that he wanted to continue on dialysis. He also states that he would continue his usual medications, but his goal is to try to be comfortable in returning to his home environment as soon as possible. Therefore, the patient's CODE STATUS is changed to DO NOT RESUSCITATE, we will continue usual therapies, however, we will not do anything aggressive such as intubation, CPR, further surgeries.    We decided that patient's code status would be DNR, and this order was made in the chart.     -Wells Guileseep Aaditya Letizia, M.D.  Time spent in discussion 30 min.  09/11/2015

## 2015-09-11 NOTE — Progress Notes (Signed)
PHARMACY - PHYSICIAN COMMUNICATION CRITICAL VALUE ALERT - BLOOD CULTURE IDENTIFICATION (BCID)  Results for orders placed or performed during the hospital encounter of 09/11/15  Blood Culture ID Panel (Reflexed) (Collected: 09/11/2015  7:37 AM)  Result Value Ref Range   Enterococcus species NOT DETECTED NOT DETECTED   Listeria monocytogenes NOT DETECTED NOT DETECTED   Staphylococcus species NOT DETECTED NOT DETECTED   Staphylococcus aureus NOT DETECTED NOT DETECTED   Streptococcus species NOT DETECTED NOT DETECTED   Streptococcus agalactiae NOT DETECTED NOT DETECTED   Streptococcus pneumoniae NOT DETECTED NOT DETECTED   Streptococcus pyogenes NOT DETECTED NOT DETECTED   Acinetobacter baumannii NOT DETECTED NOT DETECTED   Enterobacteriaceae species DETECTED (A) NOT DETECTED   Enterobacter cloacae complex NOT DETECTED NOT DETECTED   Escherichia coli DETECTED (A) NOT DETECTED   Klebsiella oxytoca NOT DETECTED NOT DETECTED   Klebsiella pneumoniae NOT DETECTED NOT DETECTED   Proteus species NOT DETECTED NOT DETECTED   Serratia marcescens NOT DETECTED NOT DETECTED   Carbapenem resistance NOT DETECTED NOT DETECTED   Haemophilus influenzae NOT DETECTED NOT DETECTED   Neisseria meningitidis NOT DETECTED NOT DETECTED   Pseudomonas aeruginosa NOT DETECTED NOT DETECTED   Candida albicans NOT DETECTED NOT DETECTED   Candida glabrata NOT DETECTED NOT DETECTED   Candida krusei NOT DETECTED NOT DETECTED   Candida parapsilosis NOT DETECTED NOT DETECTED   Candida tropicalis NOT DETECTED NOT DETECTED    Patient is already on cefepime dosed for HD. Other c/s still pending.  Name of physician (or Provider) Contacted: n/a  Changes to prescribed antibiotics required: n/a  Devin Richmond S 09/11/2015  10:44 PM

## 2015-09-11 NOTE — ED Notes (Signed)
Lab called, per lab due to small amount of urine able to be obtained they are only able to do urine culture

## 2015-09-11 NOTE — H&P (Signed)
[facility] H&P    ASSESSMENT/PLAN   50 year old male, group home resident, end-stage renal disease on hemodialysis, presents with elevated temperature of 103, and tachycardia suspicious for sepsis.  PULMONARY A: Sepsis with tachycardia and high fever. -Acute hypoxic respiratory failure, suspicious for pneumonia. Currently on review of chest x-ray images. I do not see evidence of pneumonia, however, patient may be dehydrated. We'll recheck once the patient is adequately hydrated.  P:   -We'll start treatment for septic shock including starting sepsis protocol, IV fluid resuscitation, empiric antibiotics for pneumonia, and-culture, pro-calcitonin levels.  CARDIOVASCULAR A: Essential hypertension. -Sinus tachycardia, likely due to fever. -Peripheral vascular disease due to diabetes mellitus. P:  Will start the patient on beta blockade -Currently the patient, so wound appears to be clean, will consult wound care to continue to follow.  RENAL A:  ESRD P:   Patient is currently on hemodialysis, he completed 45 minutes today, will consult nephrology to continue dialysis as needed.  GASTROINTESTINAL A:  GI prophylaxis with H2 blocker. P:   -The patient is started on a IV antibiotics, therefore will start probiotics.  HEMATOLOGIC A: --  INFECTIOUS A:  Sepsis. P:   -As above  Micro/culture results:  BCx2 -- UC -- Sputum--  Antibiotics: Vancomycin 9/9:>> Zosyn. 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   P:   -Patient currently takes Lantus 8 units daily at bedtime with sliding scale, will continue basal insulin, monitor sliding scale and replace as needed.  NEUROLOGIC A:  Altered mental status, likely metabolic encephalopathy due to sepsis. P:   -We'll continue to monitor.   MAJOR EVENTS/TEST RESULTS:   Best Practices  DVT Prophylaxis: Subcutaneous heparin. GI Prophylaxis:  Famotidine.   ---------------------------------------  ---------------------------------------   Name: Devin Richmond MRN: [id number] DOB: 02-Sep-1965    ADMISSION DATE:  09/11/2015   CHIEF COMPLAINT:  Fever   HISTORY OF PRESENT ILLNESS:    The patient is a 50 year old male who is a group home resident on hemodialysis. The patient is not able to provide much in the way of history due to critical illness. He is awake and alert and tells me that he feels fine. However, he is obviously breathing quickly, appears to be in mild distress in terms of breathing quickly, and rolling back and forth in the bed. He presented for his usual dialysis today, it was noted that he was febrile, per EMS. The heart rate was about 116. His oxygen saturation was 87. Room air.  He received about 45 minutes of dialysis. 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 ID was consulted during that admission and the patient was discharged with Augmentin 500 mg daily at bedtime.  PAST MEDICAL HISTORY :  Past Medical History:  Diagnosis Date  . Anemia   . CVA (cerebral infarction)   . Diabetes mellitus without complication ([name])   . Diabetic retinopathy ([name])   . Dialysis patient ([name])   . GERD (gastroesophageal reflux disease)   . Hyperlipidemia   . Hypertension   . Kidney disease   . 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 Pancreatitis   . Stroke [facility])    Past Surgical History:  Procedure Laterality Date  . AMPUTATION TOE Left 08/21/2015   Procedure: AMPUTATION TOE WITH FIRST RAY RESECTION;  Surgeon: Linus Galas, DPM;  Location: [facility] ORS;  Service: Podiatry;  Laterality: Left;  . fistula placement left upper arm    . PERIPHERAL VASCULAR CATHETERIZATION Right 07/20/2014   Procedure: A/V Shuntogram/Fistulagram;   Surgeon: Annice Needy, MD;  Location: [facility] INVASIVE CV LAB;  Service: Cardiovascular;  Laterality: Right;  . PERIPHERAL VASCULAR CATHETERIZATION N/A 07/20/2014   Procedure: A/V Shunt Intervention;  Surgeon: Annice Needy, MD;  Location: [facility] INVASIVE CV LAB;  Service: Cardiovascular;  Laterality: N/A;  . PERIPHERAL VASCULAR CATHETERIZATION Left 07/12/2015   Procedure: Lower Extremity Angiography;  Surgeon: Annice Needy, MD;  Location: [facility] INVASIVE CV LAB;  Service: Cardiovascular;  Laterality: Left;  . PERIPHERAL VASCULAR CATHETERIZATION Left 08/19/2015   Procedure: Lower Extremity Angiography;  Surgeon: Annice Needy, MD;  Location: [facility] INVASIVE CV LAB;  Service: Cardiovascular;  Laterality: Left;  . PERIPHERAL VASCULAR CATHETERIZATION  08/19/2015   Procedure: Lower Extremity Intervention;  Surgeon: Annice Needy, MD;  Location: [facility] INVASIVE CV LAB;  Service: Cardiovascular;;   Prior to Admission medications   Medication Sig Start Date End Date Taking? Authorizing Provider  amoxicillin-clavulanate (AUGMENTIN) 500-125 MG tablet Take 1 tablet (500 mg total) by mouth daily. 08/23/15   Adrian Saran, MD  atorvastatin (LIPITOR) 80 MG tablet Take 80 mg by mouth at bedtime.     Historical Provider, MD  atropine 1 % ophthalmic solution Place 1 drop into the left eye 2 (two) times daily at 10 AM and 5 PM.     Historical Provider, MD  carvedilol (COREG) 12.5 MG tablet Take 1 tablet (12.5 mg total) by mouth 2 (two) times daily. 08/19/14   Vesta Mixer, MD  chlorproMAZINE (THORAZINE) 10 MG tablet Take 1 tablet (10 mg total) by mouth 3 (three) times daily as needed for hiccoughs. 08/25/15   Adrian Saran, MD  cinacalcet (SENSIPAR) 90 MG tablet Take 90 mg by mouth daily.    Historical Provider, MD  clopidogrel (PLAVIX) 75 MG tablet Take 75 mg by mouth daily.    Historical Provider, MD  escitalopram (LEXAPRO) 10 MG tablet Take 1 tablet (10 mg total) by mouth daily. 08/23/15   Adrian Saran, MD  folic acid (FOLVITE) 400 MCG  tablet Take 400 mcg by mouth daily.     Historical Provider, MD  insulin aspart (NOVOLOG) 100 UNIT/ML injection Inject 2-10 Units into the skin 3 (three) times daily before meals. iCorrection coverage: Moderate (average weight, post-op)  CBG < 70: implement hypoglycemia protocol  CBG 70 - 120: 0 units  CBG 121 - 150: 2 units  CBG 151 - 200: 3 units  CBG 201 - 250: 5 units  CBG 251 - 300: 8 units  CBG 301 - 350: 11 units  CBG 351 - 400: 15 units  CBG > 400 call MD and obtain STAT lab verification 08/23/15   Adrian Saran, MD  insulin detemir (LEVEMIR) 100 UNIT/ML injection Inject 0.08 mLs (8 Units total) into the skin at bedtime. 08/25/15   Adrian Saran, MD  lanthanum (FOSRENOL) 1000 MG chewable tablet Chew 1,000 mg by mouth 3 (three) times daily with meals. Only takes if has meal    Historical Provider, MD  lidocaine-prilocaine (EMLA) cream Apply 1 application topically daily as needed (days for dialysis). 08/25/15   Adrian Saran, MD  loratadine (CLARITIN) 10 MG tablet Take 10 mg by mouth daily.    Historical Provider, MD  meclizine (ANTIVERT) 25 MG tablet Take 1 tablet (25 mg total) by mouth 2 (two) times  daily. 08/23/15   Adrian SaranSital Mody, MD  mirtazapine (REMERON) 30 MG tablet Take 30 mg by mouth at bedtime.    Historical Provider, MD  ranitidine (ZANTAC) 150 MG tablet Take 150 mg by mouth daily as needed for heartburn.    Historical Provider, MD  sevelamer carbonate (RENVELA) 800 MG tablet Take 800 mg by mouth 3 (three) times daily with meals.    Historical Provider, MD   Allergies  Allergen Reactions  . Sulfa Antibiotics Other (See Comments)    unknown    FAMILY HISTORY:  Family History  Problem Relation Age of Onset  . Stroke Father    SOCIAL HISTORY:  reports that he has never smoked. He has never used smokeless tobacco. He reports that he does not drink alcohol or use drugs.  REVIEW OF SYSTEMS:   Cannot provide due to altered mental status.    VITAL SIGNS: Temp:  [102.3 F (39.1  C)-103.2 F (39.6 C)] 102.9 F (39.4 C) (09/09 0945) Pulse Rate:  [126-161] 126 (09/09 0945) Resp:  [21-37] 31 (09/09 0945) BP: (129-157)/(70-102) 129/73 (09/09 0945) SpO2:  [89 %-99 %] 98 % (09/09 0945) Weight:  [191 lb 3.2 oz (86.7 kg)] 191 lb 3.2 oz (86.7 kg) (09/09 0727) HEMODYNAMICS:   VENTILATOR SETTINGS:   INTAKE / OUTPUT:  Intake/Output Summary (Last 24 hours) at 09/11/15 1021 Last data filed at 09/11/15 0956  Gross per 24 hour  Intake             2250 ml  Output               11 ml  Net             2239 ml    Physical Examination:   VS: BP 129/73   Pulse (!) 126   Temp (!) 102.9 F (39.4 C)   Resp (!) 31   Ht 6' (1.829 m)   Wt 191 lb 3.2 oz (86.7 kg)   SpO2 98%   BMI 25.93 kg/m   General Appearance: Patient appears uncomfortable. Neuro:without focal findings, mental status reduced but conversant. Answers questions but appears to be oriented 3. HEENT: PERRLA, EOM intact, no ptosis, no other lesions noticed;  Pulmonary: normal breath sounds., diaphragmatic excursion normal. CardiovascularNormal S1,S2.  No m/r/g.    Abdomen: Benign, Soft, non-tender, No masses, hepatosplenomegaly, No lymphadenopathy Renal:  No costovertebral tenderness  GU:  Not performed at this time. Endoc: No evident thyromegaly, no signs of acromegaly. Skin:   warm, no rashes, no ecchymosis  Extremities: Lower extremity wound. no cyanosis, clubbing, no edema, warm with reduced capillary refill.    LABS: Reviewed   LABORATORY PANEL:   CBC  Recent Labs Lab 09/11/15 0737  WBC 9.2  HGB 10.9*  HCT 33.0*  PLT 190    Chemistries   Recent Labs Lab 09/11/15 0737  NA 135  K 4.0  CL 91*  CO2 28  GLUCOSE 176*  BUN 21*  CREATININE 7.84*  CALCIUM 9.2  AST 43*  ALT 19  ALKPHOS 136*  BILITOT 0.9     Recent Labs Lab 09/11/15 0728  GLUCAP 147*   No results for input(s): PHART, PCO2ART, PO2ART in the last 168 hours.  Recent Labs Lab 09/11/15 0737  AST 43*  ALT 19   ALKPHOS 136*  BILITOT 0.9  ALBUMIN 3.0*    Cardiac Enzymes  Recent Labs Lab 09/11/15 0737  TROPONINI 0.03*    RADIOLOGY:  Dg Chest Portable 1 View  Result Date: 09/11/2015  CLINICAL DATA:  Patient with altered mental status, fever and tachycardia. EXAM: PORTABLE CHEST 1 VIEW COMPARISON:  Chest radiograph 08/23/2015 FINDINGS: Monitoring leads overlie the patient. Stable cardiac and mediastinal contours. Elevation of the right hemidiaphragm. No consolidative pulmonary opacities. No pleural effusion or pneumothorax. IMPRESSION: Elevation right hemidiaphragm.  No acute cardiopulmonary process. Electronically Signed   By: Annia Belt M.D.   On: 09/11/2015 08:19       --Wells Guiles, MD.  Board Certified in Internal Medicine, Pulmonary Medicine, Critical Care Medicine, and Sleep Medicine.  ICU Pager [phone] [facility] Office Number: [phone]  Santiago Glad, M.D.  Stephanie Acre, M.D.  Billy Fischer, M.D   09/11/2015, 10:21 AM   Critical Care Attestation.  I have personally obtained a history, examined the patient, evaluated laboratory and imaging results, formulated the assessment and plan and placed orders. The Patient requires high complexity decision making for assessment and support, frequent evaluation and titration of therapies, application of advanced monitoring technologies and extensive interpretation of multiple databases. The patient has critical illness that could lead imminently to failure of 1 or more organ systems and requires the highest level of physician preparedness to intervene.  Critical Care Time devoted to patient care services described in this note is 45 minutes and is exclusive of time spent in procedures.

## 2015-09-11 NOTE — Progress Notes (Signed)
Pt ordered SSI AC&HS, but is NPO. Dr. Arsenio LoaderSommer paged, and per his order will keep pt NPO but change SSI and CBG checks to sensitive scale and Q4.

## 2015-09-11 NOTE — ED Provider Notes (Addendum)
[facility] Emergency Department Provider Note  ____________________________________________   I have reviewed the triage vital signs and the nursing notes.   HISTORY  Chief Complaint Code Sepsis    HPI Devin Richmond is a 50 y.o. male with a history of multiple CVAs left-sided weakness, diabetes mellitus, dialysis which is Tuesday Thursday Saturday, hypertension kidney disease peripheral neuropathy pancreatitis gangrenous foot and multiple other medical problems who presents today from dialysis. According to patient he was in his normal state of health this is his normal dialysis today. He states that on dialysis became febrile and started having chills. He states he had no antecedent disease process going on. He had no chest pain no shortness breath nausea vomiting or fever no dysuria and he does make scant amounts of urine, denies any diarrhea or rectal bleeding or abdominal pain denies any scrotal pain or tenderness denies any cough or shortness of breath. In any event,he was sent here for further evaluation. He states he feels "okay". En route, his sugars were normal, his heart rate was in the 150s to 160 and he was febrile. Oxygen saturations for EMS from the mid 80s patient is not reportedly on home oxygen.      Past Medical History:  Diagnosis Date  . Anemia   . CVA (cerebral infarction)   . Diabetes mellitus without complication ([name])   . Diabetic retinopathy ([name])   . Dialysis patient ([name])   . GERD (gastroesophageal reflux disease)   . Hyperlipidemia   . Hypertension   . Kidney disease   . 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 Pancreatitis   . Stroke [facility])     Patient Active Problem List   Diagnosis Date Noted  . 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 Sinus tachycardia ([name]) 08/19/2014    Past Surgical History:  Procedure Laterality Date  . AMPUTATION TOE Left 08/21/2015   Procedure: AMPUTATION TOE WITH FIRST RAY RESECTION;  Surgeon: Linus Galas, DPM;  Location: [facility] ORS;  Service: Podiatry;  Laterality: Left;  . fistula placement left upper arm    . PERIPHERAL VASCULAR CATHETERIZATION Right 07/20/2014   Procedure: A/V Shuntogram/Fistulagram;  Surgeon: Annice Needy, MD;  Location: [facility] INVASIVE CV LAB;  Service: Cardiovascular;  Laterality: Right;  . PERIPHERAL VASCULAR CATHETERIZATION N/A 07/20/2014   Procedure: A/V Shunt Intervention;  Surgeon: Annice Needy, MD;  Location: [facility] INVASIVE CV LAB;  Service: Cardiovascular;  Laterality: N/A;  . PERIPHERAL VASCULAR CATHETERIZATION Left 07/12/2015   Procedure: Lower Extremity Angiography;  Surgeon: Annice Needy, MD;  Location: [facility] INVASIVE CV LAB;  Service: Cardiovascular;  Laterality: Left;  . PERIPHERAL VASCULAR CATHETERIZATION Left 08/19/2015   Procedure: Lower Extremity Angiography;  Surgeon: Annice Needy, MD;  Location: [facility] INVASIVE CV LAB;  Service: Cardiovascular;  Laterality: Left;  . PERIPHERAL VASCULAR CATHETERIZATION  08/19/2015   Procedure: Lower Extremity Intervention;  Surgeon: Annice Needy, MD;  Location: [facility] INVASIVE CV LAB;  Service: Cardiovascular;;    Prior to Admission medications   Medication Sig Start Date End Date Taking? Authorizing Provider  amoxicillin-clavulanate (AUGMENTIN) 500-125 MG tablet Take 1 tablet (500 mg total) by mouth daily. 08/23/15   Adrian Saran, MD  atorvastatin (LIPITOR) 80 MG tablet Take 80 mg by mouth at bedtime.     Historical Provider, MD  atropine 1 % ophthalmic solution Place 1 drop into the left eye 2 (two) times daily at 10 AM and 5 PM.     Historical Provider, MD  carvedilol (  COREG) 12.5 MG tablet Take 1 tablet (12.5 mg total) by mouth 2 (two) times daily. 08/19/14   Vesta Mixer, MD  chlorproMAZINE (THORAZINE) 10 MG tablet Take 1 tablet (10 mg total) by mouth 3 (three) times daily as needed for hiccoughs. 08/25/15   Adrian Saran, MD  cinacalcet (SENSIPAR) 90 MG tablet Take 90 mg by mouth daily.    Historical Provider, MD  clopidogrel  (PLAVIX) 75 MG tablet Take 75 mg by mouth daily.    Historical Provider, MD  escitalopram (LEXAPRO) 10 MG tablet Take 1 tablet (10 mg total) by mouth daily. 08/23/15   Adrian Saran, MD  folic acid (FOLVITE) 400 MCG tablet Take 400 mcg by mouth daily.     Historical Provider, MD  insulin aspart (NOVOLOG) 100 UNIT/ML injection Inject 2-10 Units into the skin 3 (three) times daily before meals. iCorrection coverage: Moderate (average weight, post-op)  CBG < 70: implement hypoglycemia protocol  CBG 70 - 120: 0 units  CBG 121 - 150: 2 units  CBG 151 - 200: 3 units  CBG 201 - 250: 5 units  CBG 251 - 300: 8 units  CBG 301 - 350: 11 units  CBG 351 - 400: 15 units  CBG > 400 call MD and obtain STAT lab verification 08/23/15   Adrian Saran, MD  insulin detemir (LEVEMIR) 100 UNIT/ML injection Inject 0.08 mLs (8 Units total) into the skin at bedtime. 08/25/15   Adrian Saran, MD  lanthanum (FOSRENOL) 1000 MG chewable tablet Chew 1,000 mg by mouth 3 (three) times daily with meals. Only takes if has meal    Historical Provider, MD  lidocaine-prilocaine (EMLA) cream Apply 1 application topically daily as needed (days for dialysis). 08/25/15   Adrian Saran, MD  loratadine (CLARITIN) 10 MG tablet Take 10 mg by mouth daily.    Historical Provider, MD  meclizine (ANTIVERT) 25 MG tablet Take 1 tablet (25 mg total) by mouth 2 (two) times daily. 08/23/15   Adrian Saran, MD  mirtazapine (REMERON) 30 MG tablet Take 30 mg by mouth at bedtime.    Historical Provider, MD  ranitidine (ZANTAC) 150 MG tablet Take 150 mg by mouth daily as needed for heartburn.    Historical Provider, MD  sevelamer carbonate (RENVELA) 800 MG tablet Take 800 mg by mouth 3 (three) times daily with meals.    Historical Provider, MD    Allergies Sulfa antibiotics  Family History  Problem Relation Age of Onset  . Stroke Father     Social History Social History  Substance Use Topics  . Smoking status: Never Smoker  . Smokeless tobacco: Never Used  .  Alcohol use No    Review of Systems Constitutional: Positive fever/chills Eyes: No visual changes. ENT: No sore throat. No stiff neck no neck pain Cardiovascular: Denies chest pain. Respiratory: Denies shortness of breath. Gastrointestinal:   no vomiting.  No diarrhea.  No constipation. Genitourinary: Negative for dysuria. Musculoskeletal: Negative lower extremity swelling Skin: Negative for rash. Neurological: Negative for severe headaches, focal New weakness or numbness. 10-point ROS otherwise negative.  ____________________________________________   PHYSICAL EXAM:  VITAL SIGNS: ED Triage Vitals [09/11/15 0727]  Enc Vitals Group     BP 137/84     Pulse Rate (!) 153     Resp (!) 21     Temp (!) 102.3 F (39.1 C)     Temp Source Oral     SpO2 (!) 89 %     Weight 191 lb 3.2 oz (86.7  kg)     Height 6' (1.829 m)     Head Circumference      [facility] Flow      Pain Score      Pain Loc      Pain Edu?      Excl. in GC?     Constitutional: Alert and oriented. Appears unwell Eyes: Conjunctivae are normal. PERRL. EOMI. Head: Atraumatic. Nose: No congestion/rhinnorhea. Mouth/Throat: Mucous membranes are moist.  Oropharynx non-erythematous. Neck: No stridor.   Nontender with no meningismus Cardiovascular: Tachycardia noted, regular rhythm. Grossly normal heart sounds.  Good peripheral circulation. Respiratory: Normal respiratory effort.  No retractions. Lungs somewhat diminished in the bases Abdominal: Soft and nontender. No distention. No guarding no rebound Back:  There is no focal tenderness or step off.  there is no midline tenderness there are no lesions noted. there is no CVA tenderness No evidence of Fournier's gangrene Musculoskeletal: No lower extremity tenderness, no upper extremity tenderness. No joint effusions, no DVT signs strong distal pulses no edema Neurologic:  Normal speech and language. No gross focal neurologic deficits are appreciated.  Skin:  Skin is warm,  dry and intact. The area of prior amputation on the left foot may have dry gangrene but is not wet there is no crepitus no foul odor, it is not erythematous.  Psychiatric: Mood and affect are normal. Speech and behavior are normal.  ____________________________________________   LABS (all labs ordered are listed, but only abnormal results are displayed)  Labs Reviewed  COMPREHENSIVE METABOLIC PANEL - Abnormal; Notable for the following:       Result Value   Chloride 91 (*)    Glucose, Bld 176 (*)    BUN 21 (*)    Creatinine, Ser 7.84 (*)    Total Protein 8.7 (*)    Albumin 3.0 (*)    AST 43 (*)    Alkaline Phosphatase 136 (*)    GFR calc non Af Amer 7 (*)    GFR calc Af Amer 8 (*)    Anion gap 16 (*)    All other components within normal limits  CBC WITH DIFFERENTIAL/PLATELET - Abnormal; Notable for the following:    RBC 3.43 (*)    Hemoglobin 10.9 (*)    HCT 33.0 (*)    RDW 16.7 (*)    Neutro Abs 8.3 (*)    Lymphs Abs 0.7 (*)    Monocytes Absolute 0.1 (*)    All other components within normal limits  GLUCOSE, CAPILLARY - Abnormal; Notable for the following:    Glucose-Capillary 147 (*)    All other components within normal limits  CULTURE, BLOOD (ROUTINE X 2)  CULTURE, BLOOD (ROUTINE X 2)  URINE CULTURE  LACTIC ACID, PLASMA  LACTIC ACID, PLASMA  URINALYSIS COMPLETEWITH MICROSCOPIC ([facility] ONLY)  TROPONIN I   ____________________________________________  EKG  I personally interpreted any EKGs ordered by me or triage Sinus tachycardia rate 153 bpm, normal axis, no acute ST elevation or depression. ____________________________________________  RADIOLOGY  I reviewed any imaging ordered by me or triage that were performed during my shift and, if possible, patient and/or family made aware of any abnormal findings. ____________________________________________   PROCEDURES  Procedure(s) performed: None  Procedures  Critical Care performed: CRITICAL  CARE Performed by: Jeanmarie PlantJAMES A Wenda Vanschaick   Total critical care time: 55 minutes  Critical care time was exclusive of separately billable procedures and treating other patients.  Critical care was necessary to treat or prevent imminent or life-threatening deterioration.  Critical care was time spent personally by me on the following activities: development of treatment plan with patient and/or surrogate as well as nursing, discussions with consultants, evaluation of patient's response to treatment, examination of patient, obtaining history from patient or surrogate, ordering and performing treatments and interventions, ordering and review of laboratory studies, ordering and review of radiographic studies, pulse oximetry and re-evaluation of patient's condition.   ____________________________________________   INITIAL IMPRESSION / ASSESSMENT AND PLAN / ED COURSE  Pertinent labs & imaging results that were available during my care of the patient were reviewed by me and considered in my medical decision making (see chart for details).  Patient presents acutely ill with tachycardia and fever. We're giving him IV fluid. Blood pressures are reassuring at this time. We have given him broad-spectrum antibiotics. Sources as yet unknown. Patient's multiple different possible sources including hematogenous spread from his foot, his fistula site looks uninfected at this time unfortunately, he also has low oxygen saturation suggestive of an early pneumonia. He will require admission to the hospital and further evaluation. Cultures have been obtained.   ----------------------------------------- 8:34 AM on 09/11/2015 -----------------------------------------  Patient still holding his pressure but his lactic acid is septic level for this reason we will give him aggressive fluid hydration will have talked to the ICU about admission and they concur with management and admission  Clinical Course    ____________________________________________   FINAL CLINICAL IMPRESSION(S) / ED DIAGNOSES  Final diagnoses:  Fever      This chart was dictated using voice recognition software.  Despite best efforts to proofread,  errors can occur which can change meaning.      Jeanmarie Plant, MD 09/11/15 1610    Jeanmarie Plant, MD 09/11/15 [phone]

## 2015-09-11 NOTE — Progress Notes (Signed)
eLink Physician-Brief Progress Note Patient Name: Devin Richmond DOB: 1965/10/08 MRN: [id number]   Date of Service  09/11/2015  HPI/Events of Note  Notified of need for DVT prophylaxis. Creatinine = 8.43. Dr. Carolyn Stareamachandran's note speaks about Heparin [place].  eICU Interventions  Will order Heparin [place].      Intervention Category Intermediate Interventions: Best-practice therapies (e.g. DVT, beta blocker, etc.)  Jashawna Reever Eugene 09/11/2015, 9:20 PM

## 2015-09-11 NOTE — Progress Notes (Signed)
Had MD Ram round in patients room with daughter at bedside to discuss patient's wishes per daughter request. Patient stated he would like to be a DNR and he is tired. MD Ram discussed this issue further with the patient. See new orders, will continue to monitor.

## 2015-09-11 NOTE — Clinical Social Work Note (Signed)
Clinical Social Work Assessment  Patient Details  Name: Devin Richmond MRN: [id number] Date of Birth: Jun 13, 1965  Date of referral:  09/11/15               Reason for consult:  Facility Placement                Permission sought to share information with:  Family Supports Permission granted to share information::  Yes, Verbal Permission Granted  Name::     Devin Environmental managerWilliamson  Agency::     Relationship::  step-daughter  Contact Information:  [phone]  Housing/Transportation Living arrangements for the past 2 months:  Skilled Nursing Facility Source of Information:  Patient, Adult Children Patient Interpreter Needed:  None Criminal Activity/Legal Involvement Pertinent to Current Situation/Hospitalization:  No - Comment as needed Significant Relationships:  Adult Children, Siblings Lives with:  Facility Resident Do you feel safe going back to the place where you live?  Yes Need for family participation in patient care:  No (Coment)  Care giving concerns:  Admitted from [facility]   Social Worker assessment / plan:  Patient was awake and alert, but not responsive other than to indicate orientation and to verbally consent for CSW to speak with his step-daughter, Devin.  Patient admitted to ED from [facility] dialysis with signs of sepsis. Patient admitted within the past 30 days for toe amputation and was dc'd to [facility] for STR. Patient did indicate that he would like to return to [facility] when stable to continue rehab and would like to explore LTC with [facility].   Devin contacted CSW separately by phone after bedside discussion to alert CSW about patient's wishes. According to her, the patient has declined sharply over the past year since his mother passed away, and the patient has verbalized feeling abandoned by his sister: "she doesn't care about [him] anymore". Devin is in the process of having the patient complete HCPOA information and advanced directives. Patient has  communicated to his medical team DNR status and that, while he wishes to remain in dialysis, he does not want any other aggressive treatments.  CSW provided emotional support to Devin and offered to do so with the patient. Devin thanked the CSW and reported that any further needs would be communicated. CSW will con't to follow.  Employment status:  Disabled (Comment on whether or not currently receiving Disability) (Patient receives disability ) Insurance information:  Medicare PT Recommendations:  Not assessed at this time Information / Referral to community resources:  Skilled Nursing Facility  Patient/Family's Response to care:  Patient sharply withdrawn. Patient's daughter very supportive and informed.  Patient/Family's Understanding of and Emotional Response to Diagnosis, Current Treatment, and Prognosis:  Patient shows evidence of wanting to enter palliative care. Patient's daughter is supportive of his decision and wants to help him in any way.  Emotional Assessment Appearance:  Appears older than stated age Attitude/Demeanor/Rapport:  Avoidant (Patient avoidant, patient's daughter pleasant) Affect (typically observed):  Withdrawn, Hopeless, Quiet, Depressed, Constricted Orientation:  Oriented to Place, Oriented to Self, Oriented to  Time, Oriented to Situation Alcohol / Substance use:  Never Used Psych involvement (Current and /or in the community):  Yes (Comment)  Discharge Needs  Concerns to be addressed:  Adjustment to Illness, Discharge Planning Concerns Readmission within the last 30 days:  Yes Current discharge risk:  Chronically ill Barriers to Discharge:  Continued Medical Work up   [employer] CorporationKaren M Shylah Dossantos, LCSW 09/11/2015, 4:04 PM

## 2015-09-11 NOTE — Progress Notes (Signed)
Pharmacy Antibiotic Note  [place] Callasric Christopher Grauberger is a 50 y.o. male admitted on 09/11/2015 with pneumonia and sepsis.  Pharmacy has been consulted for vancomycin and cefepime dosing. Patient received Zosyn x 1 in ED.   Plan: Vancomycin 1750 mg iv then 1000 mg iv q HD. Trough with the third HD session with goal 15-25 mcg/ml.  Cefepime 1 g iv q 24 hours.   Height: 6' (182.9 cm) Weight: 191 lb 3.2 oz (86.7 kg) IBW/kg (Calculated) : 77.6  Temp (24hrs), Avg:102.8 F (39.3 C), Min:102.2 F (39 C), Max:103.2 F (39.6 C)   Recent Labs Lab 09/11/15 0737  WBC 9.2  CREATININE 7.84*  LATICACIDVEN 4.9*    Estimated Creatinine Clearance: 12.1 mL/min (by C-G formula based on SCr of 7.84 mg/dL).    Allergies  Allergen Reactions  . Sulfa Antibiotics Other (See Comments)    unknown    Antimicrobials this admission: Zosyn 9/9 >> x 1 vancomycin 9/9 >>  Cefepime 9/9 >>  Dose adjustments this admission:   Microbiology results: 9/9 BCx: NGTD x 2 9/9 UCx: pending   Thank you for allowing pharmacy to be a part of this patient's care.  Luisa HartChristy, Adelaida Reindel D 09/11/2015 10:32 AM

## 2015-09-11 NOTE — Progress Notes (Signed)
eLink Physician-Brief Progress Note Patient Name: [place] Callasric Christopher Donner DOB: 1965-02-20 MRN: [id number]   Date of Service  09/11/2015  HPI/Events of Note  Hyperglycemia - blood glucose = 161. Currently NPO,  on Lantus and AC/HS moderate Novolog SSI/  eICU Interventions  Will order: 1. Change AC/HS moderate Novolog SSI to Q 4 hour moderate Novolog SSL     Intervention Category Intermediate Interventions: Hyperglycemia - evaluation and treatment  Imanni Burdine Eugene 09/11/2015, 6:00 PM

## 2015-09-12 ENCOUNTER — Inpatient Hospital Stay: Payer: Medicare Other

## 2015-09-12 DIAGNOSIS — A4151 Sepsis due to Escherichia coli [E. coli]: Principal | ICD-10-CM

## 2015-09-12 LAB — GLUCOSE, CAPILLARY
GLUCOSE-CAPILLARY: 79 mg/dL (ref 65–99)
Glucose-Capillary: 60 mg/dL — ABNORMAL LOW (ref 65–99)
Glucose-Capillary: 91 mg/dL (ref 65–99)
Glucose-Capillary: 77 mg/dL (ref 65–99)
Glucose-Capillary: 221 mg/dL — ABNORMAL HIGH (ref 65–99)

## 2015-09-12 LAB — BASIC METABOLIC PANEL
ANION GAP: 9 (ref 5–15)
BUN: 31 mg/dL — ABNORMAL HIGH (ref 6–20)
CALCIUM: 8.7 mg/dL — AB (ref 8.9–10.3)
CO2: 26 mmol/L (ref 22–32)
CREATININE: 9.48 mg/dL — AB (ref 0.61–1.24)
Chloride: 99 mmol/L — ABNORMAL LOW (ref 101–111)
GFR, EST AFRICAN AMERICAN: 6 mL/min — AB (ref >60)
GFR, EST NON AFRICAN AMERICAN: 6 mL/min — AB (ref >60)
Glucose, Bld: 63 mg/dL — ABNORMAL LOW (ref 65–99)
Potassium: 3.5 mmol/L (ref 3.5–5.1)
Sodium: 134 mmol/L — ABNORMAL LOW (ref 135–145)

## 2015-09-12 LAB — CBC
HCT: 30.1 % — ABNORMAL LOW (ref 40.0–52.0)
Hemoglobin: 10 g/dL — ABNORMAL LOW (ref 13.0–18.0)
MCH: 31.7 pg (ref 26.0–34.0)
MCHC: 33.4 g/dL (ref 32.0–36.0)
MCV: 95 fL (ref 80.0–100.0)
PLATELETS: 169 10*3/uL (ref 150–440)
RBC: 3.16 MIL/uL — ABNORMAL LOW (ref 4.40–5.90)
RDW: 16.2 % — AB (ref 11.5–14.5)
WBC: 11.4 10*3/uL — AB (ref 3.8–10.6)

## 2015-09-12 LAB — MAGNESIUM: Magnesium: 1.7 mg/dL (ref 1.7–2.4)

## 2015-09-12 LAB — PHOSPHORUS: Phosphorus: 3.7 mg/dL (ref 2.5–4.6)

## 2015-09-12 LAB — PROCALCITONIN: Procalcitonin: >154 ng/mL

## 2015-09-12 MED ORDER — MEROPENEM 500 MG IV SOLR
500.0000 mg | INTRAVENOUS | Status: DC
  Filled 2015-09-12 (×3): qty 0.5

## 2015-09-12 MED ORDER — DEXTROSE 50 % IV SOLN: 25.0000 g | Freq: Once | INTRAVENOUS | Status: DC

## 2015-09-12 MED ORDER — VENLAFAXINE HCL 25 MG PO TABS
25.0000 mg | ORAL_TABLET | Freq: Two times a day (BID) | ORAL | Status: DC
  Administered 2015-09-13: 25 mg via ORAL
  Filled 2015-09-12 (×4): qty 1

## 2015-09-12 NOTE — Care Management Important Message (Signed)
Important Message  Patient Details  Name: [place] Callasric Christopher Forgue MRN: [id number] Date of Birth: 04/18/1965   Medicare Important Message Given:  Yes    Khaliya Golinski A, RN 09/12/2015, 4:57 PM

## 2015-09-12 NOTE — Progress Notes (Signed)
No fsbs or insulin per pt req, just refused dialysis, considering palliative care options.  Finger stick glucose and insulin orders d/c'd per pt request.  Kristeen MissWILLIS, Emmett Arntz FIELDING [facility] 09/13/2015, 1:26 AM

## 2015-09-12 NOTE — Progress Notes (Signed)
Pharmacy Antibiotic Note  Devin Richmond is a 50 y.o. male admitted on 09/11/2015 with pneumonia and sepsis.    Patient on vancomycin/cefepime for PNA/Sepsis, urine and blood cultures with e.coli/GNR. Discussed with Dr. Elpidio AnisSudini, discontinue vancomycin and cefepime, to start meropenem.  Plan: Meropenem 500mg  IV Q24H  Continue to follow sensitivities.    Height: 6' (182.9 cm) Weight: 199 lb 11.8 oz (90.6 kg) IBW/kg (Calculated) : 77.6  Temp (24hrs), Avg:99 F (37.2 C), Min:97.3 F (36.3 C), Max:101 F (38.3 C)   Recent Labs Lab 09/11/15 0737 09/11/15 1039 09/11/15 1340 09/11/15 1644 09/12/15 0710  WBC 9.2  --   --  15.7* 11.4*  CREATININE 7.84*  --   --  8.43* 9.48*  LATICACIDVEN 4.9* 2.1* 2.4* 1.4  --     Estimated Creatinine Clearance: 10 mL/min (by C-G formula based on SCr of 9.48 mg/dL).    Allergies  Allergen Reactions  . Sulfa Antibiotics Other (See Comments)    unknown    Antimicrobials this admission: Zosyn 9/9 >> x 1 vancomycin 9/9 >> 9/10 Cefepime 9/9 >> 9/10 Meropenem 9/10 >>  Dose adjustments this admission:   Microbiology results: 9/9 BCx: GNR x 2 BCID: e. Coli, final ID/sens pending 9/9 UCx: > 100k e. coli  Thank you for allowing pharmacy to be a part of this patient's care.  Sal Spratley C 09/12/2015 12:23 PM

## 2015-09-12 NOTE — Progress Notes (Signed)
eLink Physician-Brief Progress Note Patient Name: Devin Richmond DOB: January 07, 1965 MRN: [id number]   Date of Service  09/12/2015  HPI/Events of Note  Reviewed evening labs. Electrolytes relatively stable with ongoing acute renal failure. Mild elevation in AST and alkaline phosphatase. Lactic acid normal. Leukocyte count has increased and hemoglobin now 9.6. Contacted bedside nurse who informs me that there is no visible bleeding. Patient's blood pressure stable. Just had a normal brown bowel movement. UA with 3+ leukocytes.   eICU Interventions  1. Continuing current plan of care 2. Nurse to notify me for any visible signs of bleeding 3. CBC already ordered with a.m. labs      Intervention Category Intermediate Interventions: Other:  Lawanda CousinsJennings Milee Qualls 09/12/2015, 12:10 AM

## 2015-09-12 NOTE — Progress Notes (Signed)
MD Ram rounded at patients bedside, reported to MD that patients BG was 60. Will give patient orange juice and repeat BG. Will admin dextrose if necessary per protocol. Will continue to monitor patient.

## 2015-09-12 NOTE — Progress Notes (Signed)
Pt refusing CBG monitoring and insulin, states that he does not want to be "stuck anymore." Educated pt on s/s of hypoglycemia and to notify RN if any symptoms. Pta agreeable. Dr. Anne HahnWillis made aware.

## 2015-09-12 NOTE — Progress Notes (Signed)
[facility]  ROUNDING NOTE   Subjective:  Cultures are showing Escherichia coli and Enterobacter in the blood. Patient inquired this morning about stopping dialysis and how long he would have to live. This information was provided to the patient. He states that he would like to go ahead and stop dialysis at this time. Patient's daughter was updated regarding this.   Objective:  Vital signs in last 24 hours:  Temp:  [97.3 F (36.3 C)-102.4 F (39.1 C)] 97.5 F (36.4 C) (09/10 1000) Pulse Rate:  [53-133] 95 (09/10 1000) Resp:  [10-44] 18 (09/10 1000) BP: (106-170)/(69-135) 138/100 (09/10 1000) SpO2:  [81 %-100 %] 100 % (09/10 1000) FiO2 (%):  [28 %] 28 % (09/09 1130) Weight:  [88.7 kg (195 lb 8.8 oz)-90.6 kg (199 lb 11.8 oz)] 90.6 kg (199 lb 11.8 oz) (09/10 0437)  Weight change:  Filed Weights   09/11/15 0727 09/11/15 1128 09/12/15 0437  Weight: 86.7 kg (191 lb 3.2 oz) 88.7 kg (195 lb 8.8 oz) 90.6 kg (199 lb 11.8 oz)    Intake/Output: I/O last 3 completed shifts: In: 3786.7 [I.V.:3536.7; IV Piggyback:250] Out: 112 [Urine:110; Stool:2]   Intake/Output this shift:  Total I/O In: 420 [P.O.:270; Other:150] Out: -   Physical Exam: General: No acute distress  Head: Normocephalic, atraumatic. Moist oral mucosal membranes  Eyes: Anicteric  Neck: Supple, trachea midline  Lungs:  Clear to auscultation, normal effort  Heart: S1S2  no rubs  Abdomen:  Soft, nontender, BS present  Extremities: trace peripheral edema.  Neurologic: Nonfocal, moving all four extremities  Skin: No lesions  Access: LUE AVF, non tender, good bruit and thrill    Basic Metabolic Panel:  Recent Labs Lab 09/11/15 0737 09/11/15 1644 09/12/15 0710  NA 135 134* 134*  K 4.0 3.8 3.5  CL 91* 98* 99*  CO2 28 27 26   GLUCOSE 176* 143* 63*  BUN 21* 25* 31*  CREATININE 7.84* 8.43* 9.48*  CALCIUM 9.2 8.6* 8.7*  MG 1.6*  --  1.7  PHOS 4.1  --  3.7    Liver Function Tests:  Recent  Labs Lab 09/11/15 0737 09/11/15 1644  AST 43* 42*  ALT 19 26  ALKPHOS 136* 142*  BILITOT 0.9 0.9  PROT 8.7* 7.1  ALBUMIN 3.0* 2.6*   No results for input(s): LIPASE, AMYLASE in the last 168 hours. No results for input(s): AMMONIA in the last 168 hours.  CBC:  Recent Labs Lab 09/11/15 0737 09/11/15 1644 09/12/15 0710  WBC 9.2 15.7* 11.4*  NEUTROABS 8.3*  --   --   HGB 10.9* 9.6* 10.0*  HCT 33.0* 28.9* 30.1*  MCV 96.2 95.6 95.0  PLT 190 164 169    Cardiac Enzymes:  Recent Labs Lab 09/11/15 0737  TROPONINI 0.03*    BNP: Invalid input(s): POCBNP  CBG:  Recent Labs Lab 09/11/15 1941 09/11/15 2342 09/12/15 0346 09/12/15 0745 09/12/15 0903  GLUCAP 183* 90 79 60* 91    Microbiology: Results for orders placed or performed during the hospital encounter of 09/11/15  Culture, blood (Routine x 2)     Status: None (Preliminary result)   Collection Time: 09/11/15  7:37 AM  Result Value Ref Range Status   Specimen Description BLOOD LEFT ANTECUBITAL  Final   Special Requests BOTTLES DRAWN AEROBIC AND ANAEROBIC  8CC  Final   Culture  Setup Time   Final    GRAM NEGATIVE RODS IN BOTH AEROBIC AND ANAEROBIC BOTTLES CRITICAL VALUE NOTED.  VALUE IS CONSISTENT WITH  PREVIOUSLY REPORTED AND CALLED VALUE.    Culture   Final    GRAM NEGATIVE RODS CULTURE REINCUBATED FOR BETTER GROWTH Performed at [facility]    Report Status PENDING  Incomplete  Culture, blood (Routine x 2)     Status: None (Preliminary result)   Collection Time: 09/11/15  7:37 AM  Result Value Ref Range Status   Specimen Description BLOOD LEFT ARM  Final   Special Requests   Final    BOTTLES DRAWN AEROBIC AND ANAEROBIC  AER 8CC ANA 4CC   Culture  Setup Time   Final    GRAM NEGATIVE RODS IN BOTH AEROBIC AND ANAEROBIC BOTTLES CRITICAL RESULT CALLED TO, READ BACK BY AND VERIFIED WITH: MATT MCBANE 09/11/15 AT 2200 BY HS    Culture   Final    GRAM NEGATIVE RODS CULTURE REINCUBATED FOR BETTER  GROWTH Performed at [facility]    Report Status PENDING  Incomplete  Blood Culture ID Panel (Reflexed)     Status: Abnormal   Collection Time: 09/11/15  7:37 AM  Result Value Ref Range Status   Enterococcus species NOT DETECTED NOT DETECTED Final   Listeria monocytogenes NOT DETECTED NOT DETECTED Final   Staphylococcus species NOT DETECTED NOT DETECTED Final   Staphylococcus aureus NOT DETECTED NOT DETECTED Final   Streptococcus species NOT DETECTED NOT DETECTED Final   Streptococcus agalactiae NOT DETECTED NOT DETECTED Final   Streptococcus pneumoniae NOT DETECTED NOT DETECTED Final   Streptococcus pyogenes NOT DETECTED NOT DETECTED Final   Acinetobacter baumannii NOT DETECTED NOT DETECTED Final   Enterobacteriaceae species DETECTED (A) NOT DETECTED Final    Comment: CRITICAL RESULT CALLED TO, READ BACK BY AND VERIFIED WITH: MATT MCBANE 09/11/15 AT 2200 BY HS    Enterobacter cloacae complex NOT DETECTED NOT DETECTED Final   Escherichia coli DETECTED (A) NOT DETECTED Final    Comment: CRITICAL RESULT CALLED TO, READ BACK BY AND VERIFIED WITH: MATT MCBANE 09/11/15 AT 2200 BY HS    Klebsiella oxytoca NOT DETECTED NOT DETECTED Final   Klebsiella pneumoniae NOT DETECTED NOT DETECTED Final   Proteus species NOT DETECTED NOT DETECTED Final   Serratia marcescens NOT DETECTED NOT DETECTED Final   Carbapenem resistance NOT DETECTED NOT DETECTED Final   Haemophilus influenzae NOT DETECTED NOT DETECTED Final   Neisseria meningitidis NOT DETECTED NOT DETECTED Final   Pseudomonas aeruginosa NOT DETECTED NOT DETECTED Final   Candida albicans NOT DETECTED NOT DETECTED Final   Candida glabrata NOT DETECTED NOT DETECTED Final   Candida krusei NOT DETECTED NOT DETECTED Final   Candida parapsilosis NOT DETECTED NOT DETECTED Final   Candida tropicalis NOT DETECTED NOT DETECTED Final  MRSA PCR Screening     Status: None   Collection Time: 09/11/15 11:28 AM  Result Value Ref Range Status    MRSA by PCR NEGATIVE NEGATIVE Final    Comment:        The GeneXpert MRSA Assay (FDA approved for NASAL specimens only), is one component of a comprehensive MRSA colonization surveillance program. It is not intended to diagnose MRSA infection nor to guide or monitor treatment for MRSA infections.     Coagulation Studies:  Recent Labs  09/11/15 0737  LABPROT 14.4  INR 1.11    Urinalysis:  Recent Labs  09/11/15 1802  COLORURINE YELLOW*  LABSPEC 1.007  PHURINE 9.0*  GLUCOSEU >500*  HGBUR 3+*  BILIRUBINUR NEGATIVE  KETONESUR NEGATIVE  PROTEINUR 100*  NITRITE NEGATIVE  LEUKOCYTESUR 3+*  Imaging: Dg Chest Port 1 View  Result Date: 09/12/2015 CLINICAL DATA:  Acute onset of dyspnea.  Sepsis.  Initial encounter. EXAM: PORTABLE CHEST 1 VIEW COMPARISON:  Chest radiograph performed 09/11/2015 FINDINGS: The lungs are well-aerated. Pulmonary vascularity is at the upper limits of normal. Mild left basilar atelectasis is noted. There is no evidence of pleural effusion or pneumothorax. The cardiomediastinal silhouette is borderline normal in size. No acute osseous abnormalities are seen. IMPRESSION: Mild left basilar atelectasis noted.  Lungs otherwise clear. Electronically Signed   By: Roanna RaiderJeffery  Chang M.D.   On: 09/12/2015 06:23   Dg Chest Portable 1 View  Result Date: 09/11/2015 CLINICAL DATA:  Patient with altered mental status, fever and tachycardia. EXAM: PORTABLE CHEST 1 VIEW COMPARISON:  Chest radiograph 08/23/2015 FINDINGS: Monitoring leads overlie the patient. Stable cardiac and mediastinal contours. Elevation of the right hemidiaphragm. No consolidative pulmonary opacities. No pleural effusion or pneumothorax. IMPRESSION: Elevation right hemidiaphragm.  No acute cardiopulmonary process. Electronically Signed   By: Annia Beltrew  Davis M.D.   On: 09/11/2015 08:19     Medications:   . sodium chloride 50 mL/hr at 09/11/15 1216   . acidophilus  2 capsule Oral Daily  .  carvedilol  3.125 mg Oral BID WC  . ceFEPime (MAXIPIME) IV  1 g Intravenous Q24H  . dextrose  25 g Intravenous Once  . famotidine  20 mg Oral Daily  . heparin subcutaneous  5,000 Units Subcutaneous Q12H  . insulin aspart  0-15 Units Subcutaneous Q4H  . insulin detemir  10 Units Subcutaneous QHS  . mouth rinse  15 mL Mouth Rinse BID  . vancomycin  1,000 mg Intravenous Q T,Th,Sa-HD  . venlafaxine  25 mg Oral BID WC   sodium chloride, acetaminophen, albuterol, metoprolol, ondansetron (ZOFRAN) IV  Assessment/ Plan:  50 y.o. 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  [facility]/TTS  1.  ESRD on HD TTS: This a.m. the patient inquired about stopping dialysis. After providing the patient this information he stated that he would like to go ahead and stop dialysis. The patient has had significant decline over the past several months. He stated that he thought about this decision overnight and is comfortable with his decision. We have updated the patient's daughter regarding this as well. Therefore we recommend transition to palliative/Comfort Care.  2. Fever/chills/sepsis: Enterobacter and Escherichia coli growing in the blood. Patient is currently on vancomycin and cefepime.  3. Anemia of chronic kidney disease. As the patient has decided to stop dialysis there is no further need for Epogen.  4. Secondary hyperparathyroidism.  discontinue Sensipar as well as Renvela. We will also transition the patient to regular diet now.     LOS: 1 Adaysha Dubinsky 9/10/201710:18 AM

## 2015-09-12 NOTE — Progress Notes (Addendum)
[facility]    ASSESSMENT/PLAN   50 year old male, group home resident, end-stage renal disease on hemodialysis, presents with elevated temperature of 103, and tachycardia suspicious for sepsis.  PULMONARY A: Sepsis with tachycardia and high fever, now improved.  -Acute hypoxic respiratory failure, suspicious for pneumonia. Currently on review of chest x-ray images from 9/10, there appears to be slight right hilar fullness suspicious for pneumonia, but the patient lack any significant symptomatology and remain on RA.  P:   -doubt HCAP.  -Supplemental O2 prn  CARDIOVASCULAR A:  -Essential hypertension. -Sinus tachycardia, improved.  -Peripheral vascular disease due to diabetes mellitus. P:  -IV fluids -Continue coreg once BP is stable -f/u wound care consult for lower extremity ulcers   RENAL A:   ESRD on dialysis P:   -HD 09/11/15 for a total of 45 minutes -F/U nephrology consult for recs -Monitor and replace electrolytes  GASTROINTESTINAL A:   H/o pancreatitis H/o GERD P:   -GI prophylaxis with H2 blocker.  HEMATOLOGIC A:  Anemia of chronic disease P: -Trend CBC and transfuse once hemoglobin is <7  INFECTIOUS A:   Sepsis; GNR in blood, suspicious for urinary source. P:   -f/u cultures -Trend pro-calcitonin levels -D/c vancomycin.  --Await further culture results.    Micro/culture results: MRSA screen 9/9: negative.  BCx2 9/9: GNR; [employer] + for enterobacter/Ecoli.  UC --Pending.  Sputum--  Antibiotics: Vancomycin 9/9:>> Zosyn. 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   Mild hypoglycemia this am.  P:   -Continue current coverage, allow patient to eat this am.   NEUROLOGIC/Psych A:   Altered mental status, likely metabolic encephalopathy due to sepsis, now improved. --Suspect depression; pt recently stopped all meds because of being "tired of everything" P:     -improved, advance activity as tolerated.  --Start antidepressant.   MAJOR EVENTS/TEST RESULTS: 09/09: Admitted with sepsis of unknown source  Best Practices  DVT Prophylaxis: Subcutaneous heparin. GI Prophylaxis: Famotidine.   ---------------------------------------  ---------------------------------------   Name: Montura Callasric Christopher Holcomb MRN: [id number] DOB: [birth year]    ADMISSION DATE:  09/11/2015   CHIEF COMPLAINT:  Fever   HISTORY OF PRESENT ILLNESS:     SUBJECTIVE: Febrile over night; fever improved with prn tylenol  VITAL SIGNS: Temp:  [98.3 F (36.8 C)-103.2 F (39.6 C)] 99.7 F (37.6 C) (09/10 0600) Pulse Rate:  [53-161] 108 (09/10 0600) Resp:  [10-44] 22 (09/10 0600) BP: (106-170)/(69-135) 118/83 (09/10 0600) SpO2:  [81 %-100 %] 100 % (09/10 0600) FiO2 (%):  [28 %] 28 % (09/09 1130) Weight:  [191 lb 3.2 oz (86.7 kg)-199 lb 11.8 oz (90.6 kg)] 199 lb 11.8 oz (90.6 kg) (09/10 0437) HEMODYNAMICS:   VENTILATOR SETTINGS: FiO2 (%):  [28 %] 28 % INTAKE / OUTPUT:  Intake/Output Summary (Last 24 hours) at 09/12/15 0648 Last data filed at 09/12/15 0300  Gross per 24 hour  Intake          3786.67 ml  Output              112 ml  Net          3674.67 ml    Physical Examination:   VS: BP 118/83   Pulse (!) 108   Temp 99.7 F (37.6 C)   Resp (!) 22   Ht 6' (1.829 m)   Wt 199 lb 11.8 oz (90.6 kg)   SpO2 100%   BMI 27.09 kg/m   General Appearance: Patient  appears comfortable.  Neuro:without focal findings, mental status reduced but conversant. Answers questions but appears to be oriented 2. HEENT: PERRLA, EOM intact, no ptosis, no other lesions noticed;  Pulmonary: normal breath sounds., diaphragmatic excursion normal. CardiovascularNormal S1,S2.  No m/r/g.    Abdomen: Benign, Soft, non-tender, No masses, hepatosplenomegaly, No lymphadenopathy Renal:  No costovertebral tenderness  GU:  Not performed at this time. Endoc: No evident thyromegaly, no  signs of acromegaly. Skin:   warm, no rashes, no ecchymosis  Extremities: Lower extremity wound. no cyanosis, clubbing, no edema, warm with reduced capillary refill.    LABS: Reviewed   LABORATORY PANEL:   CBC  Recent Labs Lab 09/11/15 1644  WBC 15.7*  HGB 9.6*  HCT 28.9*  PLT 164    Chemistries   Recent Labs Lab 09/11/15 0737 09/11/15 1644  NA 135 134*  K 4.0 3.8  CL 91* 98*  CO2 28 27  GLUCOSE 176* 143*  BUN 21* 25*  CREATININE 7.84* 8.43*  CALCIUM 9.2 8.6*  MG 1.6*  --   PHOS 4.1  --   AST 43* 42*  ALT 19 26  ALKPHOS 136* 142*  BILITOT 0.9 0.9     Recent Labs Lab 09/11/15 0728 09/11/15 1136 09/11/15 1733 09/11/15 1941 09/11/15 2342 09/12/15 0346  GLUCAP 147* 182* 161* 183* 90 79    Recent Labs Lab 09/11/15 1016  PHART 7.48*  PCO2ART 40  PO2ART 86    Recent Labs Lab 09/11/15 0737 09/11/15 1644  AST 43* 42*  ALT 19 26  ALKPHOS 136* 142*  BILITOT 0.9 0.9  ALBUMIN 3.0* 2.6*    Cardiac Enzymes  Recent Labs Lab 09/11/15 0737  TROPONINI 0.03*    RADIOLOGY:  Dg Chest Port 1 View  Result Date: 09/12/2015 CLINICAL DATA:  Acute onset of dyspnea.  Sepsis.  Initial encounter. EXAM: PORTABLE CHEST 1 VIEW COMPARISON:  Chest radiograph performed 09/11/2015 FINDINGS: The lungs are well-aerated. Pulmonary vascularity is at the upper limits of normal. Mild left basilar atelectasis is noted. There is no evidence of pleural effusion or pneumothorax. The cardiomediastinal silhouette is borderline normal in size. No acute osseous abnormalities are seen. IMPRESSION: Mild left basilar atelectasis noted.  Lungs otherwise clear. Electronically Signed   By: Roanna Raider M.D.   On: 09/12/2015 06:23   Dg Chest Portable 1 View  Result Date: 09/11/2015 CLINICAL DATA:  Patient with altered mental status, fever and tachycardia. EXAM: PORTABLE CHEST 1 VIEW COMPARISON:  Chest radiograph 08/23/2015 FINDINGS: Monitoring leads overlie the patient. Stable cardiac  and mediastinal contours. Elevation of the right hemidiaphragm. No consolidative pulmonary opacities. No pleural effusion or pneumothorax. IMPRESSION: Elevation right hemidiaphragm.  No acute cardiopulmonary process. Electronically Signed   By: Annia Belt M.D.   On: 09/11/2015 08:19    CCM time=35 minutes  Magdalene S. [facility] ANP-BC Pulmonary and Critical Care Medicine [facility] Pager [phone] or [phone] 09/12/2015, 6:48 AM  Pt seen and exaimined, agree with NP assessment and plan.  Sepsis, likely to due urinary tract source. Currently the patient is much more comfortable, lung CTA-B. Continue abx, advance diet today. Monitor cultures.  Wells Guiles, M.D. 09/12/2015   Critical Care Attestation.  I have personally obtained a history, examined the patient, evaluated laboratory and imaging results, formulated the assessment and plan and placed orders. The Patient requires high complexity decision making for assessment and support, frequent evaluation and titration of therapies, application of advanced monitoring technologies and extensive interpretation of multiple databases. The patient has critical  illness that could lead imminently to failure of 1 or more organ systems and requires the highest level of physician preparedness to intervene.  Critical Care Time devoted to patient care services described in this note is 35 minutes and is exclusive of time spent in procedures.

## 2015-09-13 LAB — URINE CULTURE

## 2015-09-13 NOTE — Consult Note (Signed)
WOC Nurse wound consult note Reason for Consult: Surgical site.  Amputation left great toe.  Sutures were to be removed Friday and patient missed appointment.  Sutures are dry and embedded in skin today.  Will remove.   Discussed patient's desire to discontinue with Dialysis.  He understands dialysis is necessary for life and that he will shorten his life by discontinuing this.  His mother and father are deceased.  HE has a 345 year old son, just celebrated his birthday with him.  States that the child lives with his mother and she understands his decision.   Wound type:Surgical site.  Pressure Ulcer POA:N/A Measurement: 12 cm suture line at great toe amputation site.  Wound bed:Intact suture line Drainage (amount, consistency, odor) Minimal serosanguinous   No odor.  Periwound:Dry, crusted.   Dressing procedure/placement/frequency:Cleansed suture line with Betadine.  Removed 14 sutures.  Mepitel silicone contact layer placed and dry dressing, kerlix and tape.  Change Monday, Wednesday and Friday.  Will not follow at this time.  Please re-consult if needed.  Maple HudsonKaren Eisha Chatterjee RN BSN CWON Pager [phone]

## 2015-09-13 NOTE — Progress Notes (Signed)
[facility] [place] [facility]   PATIENT NAME: Devin Richmond    MR#:  [id number]  DATE OF BIRTH:  Feb 26, 1965  SUBJECTIVE:   he is here due to sepsis secondary to urinary tract infection. Currently on IV Meropenem.  Pt. Refusing fingersticks and dialysis.  Now a DNR and does not want aggressive care.  Palliative Care consult pending.   REVIEW OF SYSTEMS:    Review of Systems  Constitutional: Negative for chills and fever.  HENT: Negative for congestion and tinnitus.   Eyes: Negative for blurred vision and double vision.  Respiratory: Negative for cough, shortness of breath and wheezing.   Cardiovascular: Negative for chest pain, orthopnea and PND.  Gastrointestinal: Negative for abdominal pain, diarrhea, nausea and vomiting.  Genitourinary: Negative for dysuria and hematuria.  Neurological: Negative for dizziness, sensory change and focal weakness.  All other systems reviewed and are negative.   Nutrition: Renal/diabetic Tolerating Diet: Yes Tolerating PT: Bedbound.   DRUG ALLERGIES:   Allergies  Allergen Reactions  . Sulfa Antibiotics Other (See Comments)    unknown    VITALS:  Blood pressure (!) 149/88, pulse (!) 102, temperature 98.1 F (36.7 C), temperature source Oral, resp. rate 16, height 6' (1.829 m), weight 92.4 kg (203 lb 12.8 oz), SpO2 98 %.  PHYSICAL EXAMINATION:   Physical Exam  GENERAL:  50 y.o.-year-old patient lying in the bed in no acute distress.  EYES: Pupils equal, round, reactive to light and accommodation. No scleral icterus. Extraocular muscles intact.  HEENT: Head atraumatic, normocephalic. Oropharynx and nasopharynx clear.  NECK:  Supple, no jugular venous distention. No thyroid enlargement, no tenderness.  LUNGS: Normal breath sounds bilaterally, no wheezing, rales, rhonchi. No use of accessory muscles of respiration.  CARDIOVASCULAR: S1, S2 normal. No murmurs, rubs, or gallops.  ABDOMEN: Soft, nontender, nondistended.  Bowel sounds present. No organomegaly or mass.  EXTREMITIES: No cyanosis, clubbing or edema b/l. LLE dressing in place from recent amputation.    NEUROLOGIC: Cranial nerves II through XII are intact. No focal Motor or sensory deficits b/l.  Globally weak.  PSYCHIATRIC: The patient is alert and oriented x 3.  SKIN: No obvious rash, lesion, or ulcer.   RUE AV fistula with good bruit and good thrill.    LABORATORY PANEL:   CBC  Recent Labs Lab 09/12/15 0710  WBC 11.4*  HGB 10.0*  HCT 30.1*  PLT 169   ------------------------------------------------------------------------------------------------------------------  Chemistries   Recent Labs Lab 09/11/15 1644 09/12/15 0710  NA 134* 134*  K 3.8 3.5  CL 98* 99*  CO2 27 26  GLUCOSE 143* 63*  BUN 25* 31*  CREATININE 8.43* 9.48*  CALCIUM 8.6* 8.7*  MG  --  1.7  AST 42*  --   ALT 26  --   ALKPHOS 142*  --   BILITOT 0.9  --    ------------------------------------------------------------------------------------------------------------------  Cardiac Enzymes  Recent Labs Lab 09/11/15 0737  TROPONINI 0.03*   ------------------------------------------------------------------------------------------------------------------  RADIOLOGY:  Dg Chest Port 1 View  Result Date: 09/12/2015 CLINICAL DATA:  Acute onset of dyspnea.  Sepsis.  Initial encounter. EXAM: PORTABLE CHEST 1 VIEW COMPARISON:  Chest radiograph performed 09/11/2015 FINDINGS: The lungs are well-aerated. Pulmonary vascularity is at the upper limits of normal. Mild left basilar atelectasis is noted. There is no evidence of pleural effusion or pneumothorax. The cardiomediastinal silhouette is borderline normal in size. No acute osseous abnormalities are seen. IMPRESSION: Mild left basilar atelectasis noted.  Lungs otherwise clear. Electronically Signed  By: Roanna RaiderJeffery  Chang M.D.   On: 09/12/2015 06:23     ASSESSMENT AND PLAN:   50 yo male with past medical history  of end-stage renal disease on hemodialysis, peripheral vascular disease, diabetes, diabetic retinopathy, diabetic neuropathy, GERD, chronic anemia, history of previous CVA who presented to the hospital due to sepsis.  1. Sepsis-this is secondary to urinary tract infection. -Patient noted to have ESBL UTI. Continue IV meropenem for now. -Blood cultures also positive for Escherichia coli but have not been identified yet. Continue supportive care. Afebrile and hemodynamically stable.  2. ESBL urinary tract infection-continue IV meropenem.  3. End-stage renal disease on hemodialysis-patient got only partial dialysis yesterday as he refused it. -Patient normally gets dialysis on Tuesday Thursday and Saturday.  4. Essential hypertension-continue Coreg.  5. Depression-continue Effexor.  I had an Extensive discussion with the patient regarding differences between DO NOT RESUSCITATE and comfort care. He does not want any further aggressive care and wants to just be kept comfortable. Palliative care consult is currently pending.  All the records are reviewed and case discussed with Care Management/Social Worker. Management plans discussed with the patient, family and they are in agreement.  CODE STATUS: DNR  DVT Prophylaxis: Heparin SQ  TOTAL TIME TAKING CARE OF THIS PATIENT: 30 minutes.   POSSIBLE D/C IN 1-2 DAYS, DEPENDING ON CLINICAL CONDITION.   Houston SirenSAINANI,Devin Richmond J M.D on 09/13/2015 at 3:18 PM  Between 7am to 6pm - Pager - [phone]  After 6pm go to [email or web address] - Social research officer, [email or web address] [email or web address]  [employer] [place] [facility]  Office  [phone]  CC: Primary care physician; Devin ShamesSingh,Jasmine, MD

## 2015-09-13 NOTE — Progress Notes (Signed)
[facility]  ROUNDING NOTE   Subjective:   Family at bedside Patient is refusing needle sticks and states he does not want to continue dialysis any more   Objective:  Vital signs in last 24 hours:  Temp:  [98.2 F (36.8 C)-98.8 F (37.1 C)] 98.8 F (37.1 C) (09/11 0446) Pulse Rate:  [102-104] 104 (09/11 0446) Resp:  [17-22] 20 (09/11 0446) BP: (123-128)/(76-87) 125/79 (09/11 0446) SpO2:  [92 %-100 %] 92 % (09/11 0446) Weight:  [92.4 kg (203 lb 12.8 oz)] 92.4 kg (203 lb 12.8 oz) (09/11 0441)  Weight change: 5.715 kg (12 lb 9.6 oz) Filed Weights   09/11/15 1128 09/12/15 0437 09/13/15 0441  Weight: 88.7 kg (195 lb 8.8 oz) 90.6 kg (199 lb 11.8 oz) 92.4 kg (203 lb 12.8 oz)    Intake/Output: I/O last 3 completed shifts: In: 900 [P.O.:750; Other:150] Out: 100 [Urine:100]   Intake/Output this shift:  Total I/O In: 240 [P.O.:240] Out: 0   Physical Exam: General: No acute distress  Head: Normocephalic, atraumatic. Moist oral mucosal membranes  Eyes: Anicteric  Neck: Supple, trachea midline  Lungs:  Clear to auscultation, normal effort  Heart: S1S2  no rubs  Abdomen:  Soft, nontender, BS present  Extremities: trace peripheral edema.  Neurologic: Nonfocal, moving all four extremities  Skin: No lesions  Access: LUE AVF, non tender, good bruit and thrill    Basic Metabolic Panel:  Recent Labs Lab 09/11/15 0737 09/11/15 1644 09/12/15 0710  NA 135 134* 134*  K 4.0 3.8 3.5  CL 91* 98* 99*  CO2 28 27 26   GLUCOSE 176* 143* 63*  BUN 21* 25* 31*  CREATININE 7.84* 8.43* 9.48*  CALCIUM 9.2 8.6* 8.7*  MG 1.6*  --  1.7  PHOS 4.1  --  3.7    Liver Function Tests:  Recent Labs Lab 09/11/15 0737 09/11/15 1644  AST 43* 42*  ALT 19 26  ALKPHOS 136* 142*  BILITOT 0.9 0.9  PROT 8.7* 7.1  ALBUMIN 3.0* 2.6*   No results for input(s): LIPASE, AMYLASE in the last 168 hours. No results for input(s): AMMONIA in the last 168 hours.  CBC:  Recent  Labs Lab 09/11/15 0737 09/11/15 1644 09/12/15 0710  WBC 9.2 15.7* 11.4*  NEUTROABS 8.3*  --   --   HGB 10.9* 9.6* 10.0*  HCT 33.0* 28.9* 30.1*  MCV 96.2 95.6 95.0  PLT 190 164 169    Cardiac Enzymes:  Recent Labs Lab 09/11/15 0737  TROPONINI 0.03*    BNP: Invalid input(s): POCBNP  CBG:  Recent Labs Lab 09/12/15 0346 09/12/15 0745 09/12/15 0903 09/12/15 1254 09/12/15 1704  GLUCAP 79 60* 91 77 221*    Microbiology: Results for orders placed or performed during the hospital encounter of 09/11/15  Culture, blood (Routine x 2)     Status: Abnormal (Preliminary result)   Collection Time: 09/11/15  7:37 AM  Result Value Ref Range Status   Specimen Description BLOOD LEFT ANTECUBITAL  Final   Special Requests BOTTLES DRAWN AEROBIC AND ANAEROBIC  8CC  Final   Culture  Setup Time   Final    GRAM NEGATIVE RODS IN BOTH AEROBIC AND ANAEROBIC BOTTLES CRITICAL VALUE NOTED.  VALUE IS CONSISTENT WITH PREVIOUSLY REPORTED AND CALLED VALUE.    Culture (A)  Final    ESCHERICHIA COLI SUSCEPTIBILITIES TO FOLLOW Performed [facility]    Report Status PENDING  Incomplete  Culture, blood (Routine x 2)     Status: Abnormal (  Preliminary result)   Collection Time: 09/11/15  7:37 AM  Result Value Ref Range Status   Specimen Description BLOOD LEFT ARM  Final   Special Requests   Final    BOTTLES DRAWN AEROBIC AND ANAEROBIC  AER 8CC ANA 4CC   Culture  Setup Time   Final    GRAM NEGATIVE RODS IN BOTH AEROBIC AND ANAEROBIC BOTTLES CRITICAL RESULT CALLED TO, READ BACK BY AND VERIFIED WITH: MATT MCBANE 09/11/15 AT 2200 BY HS    Culture (A)  Final    ESCHERICHIA COLI CULTURE REINCUBATED FOR BETTER GROWTH Performed at [facility]    Report Status PENDING  Incomplete  Blood Culture ID Panel (Reflexed)     Status: Abnormal   Collection Time: 09/11/15  7:37 AM  Result Value Ref Range Status   Enterococcus species NOT DETECTED NOT DETECTED Final   Listeria  monocytogenes NOT DETECTED NOT DETECTED Final   Staphylococcus species NOT DETECTED NOT DETECTED Final   Staphylococcus aureus NOT DETECTED NOT DETECTED Final   Streptococcus species NOT DETECTED NOT DETECTED Final   Streptococcus agalactiae NOT DETECTED NOT DETECTED Final   Streptococcus pneumoniae NOT DETECTED NOT DETECTED Final   Streptococcus pyogenes NOT DETECTED NOT DETECTED Final   Acinetobacter baumannii NOT DETECTED NOT DETECTED Final   Enterobacteriaceae species DETECTED (A) NOT DETECTED Final    Comment: CRITICAL RESULT CALLED TO, READ BACK BY AND VERIFIED WITH: MATT MCBANE 09/11/15 AT 2200 BY HS    Enterobacter cloacae complex NOT DETECTED NOT DETECTED Final   Escherichia coli DETECTED (A) NOT DETECTED Final    Comment: CRITICAL RESULT CALLED TO, READ BACK BY AND VERIFIED WITH: MATT MCBANE 09/11/15 AT 2200 BY HS    Klebsiella oxytoca NOT DETECTED NOT DETECTED Final   Klebsiella pneumoniae NOT DETECTED NOT DETECTED Final   Proteus species NOT DETECTED NOT DETECTED Final   Serratia marcescens NOT DETECTED NOT DETECTED Final   Carbapenem resistance NOT DETECTED NOT DETECTED Final   Haemophilus influenzae NOT DETECTED NOT DETECTED Final   Neisseria meningitidis NOT DETECTED NOT DETECTED Final   Pseudomonas aeruginosa NOT DETECTED NOT DETECTED Final   Candida albicans NOT DETECTED NOT DETECTED Final   Candida glabrata NOT DETECTED NOT DETECTED Final   Candida krusei NOT DETECTED NOT DETECTED Final   Candida parapsilosis NOT DETECTED NOT DETECTED Final   Candida tropicalis NOT DETECTED NOT DETECTED Final  Urine culture     Status: Abnormal   Collection Time: 09/11/15  9:09 AM  Result Value Ref Range Status   Specimen Description URINE, RANDOM  Final   Special Requests NONE  Final   Culture (A)  Final    >=100,000 COLONIES/mL ESCHERICHIA COLI Confirmed Extended Spectrum Beta-Lactamase Producer (ESBL) Performed at [facility]    Report Status 09/13/2015 FINAL  Final    Organism ID, Bacteria ESCHERICHIA COLI (A)  Final      Susceptibility   Escherichia coli - MIC*    AMPICILLIN >=32 RESISTANT Resistant     CEFAZOLIN >=64 RESISTANT Resistant     CEFTRIAXONE >=64 RESISTANT Resistant     CIPROFLOXACIN >=4 RESISTANT Resistant     GENTAMICIN <=1 SENSITIVE Sensitive     IMIPENEM <=0.25 SENSITIVE Sensitive     NITROFURANTOIN <=16 SENSITIVE Sensitive     TRIMETH/SULFA <=20 SENSITIVE Sensitive     AMPICILLIN/SULBACTAM 4 SENSITIVE Sensitive     PIP/TAZO <=4 SENSITIVE Sensitive     Extended ESBL POSITIVE Resistant     * >=100,000 COLONIES/mL ESCHERICHIA COLI  MRSA PCR Screening     Status: None   Collection Time: 09/11/15 11:28 AM  Result Value Ref Range Status   MRSA by PCR NEGATIVE NEGATIVE Final    Comment:        The GeneXpert MRSA Assay (FDA approved for NASAL specimens only), is one component of a comprehensive MRSA colonization surveillance program. It is not intended to diagnose MRSA infection nor to guide or monitor treatment for MRSA infections.     Coagulation Studies:  Recent Labs  09/11/15 0737  LABPROT 14.4  INR 1.11    Urinalysis:  Recent Labs  09/11/15 1802  COLORURINE YELLOW*  LABSPEC 1.007  PHURINE 9.0*  GLUCOSEU >500*  HGBUR 3+*  BILIRUBINUR NEGATIVE  KETONESUR NEGATIVE  PROTEINUR 100*  NITRITE NEGATIVE  LEUKOCYTESUR 3+*      Imaging: Dg Chest Port 1 View  Result Date: 09/12/2015 CLINICAL DATA:  Acute onset of dyspnea.  Sepsis.  Initial encounter. EXAM: PORTABLE CHEST 1 VIEW COMPARISON:  Chest radiograph performed 09/11/2015 FINDINGS: The lungs are well-aerated. Pulmonary vascularity is at the upper limits of normal. Mild left basilar atelectasis is noted. There is no evidence of pleural effusion or pneumothorax. The cardiomediastinal silhouette is borderline normal in size. No acute osseous abnormalities are seen. IMPRESSION: Mild left basilar atelectasis noted.  Lungs otherwise clear. Electronically Signed    By: Roanna Raider M.D.   On: 09/12/2015 06:23     Medications:     . acidophilus  2 capsule Oral Daily  . carvedilol  3.125 mg Oral BID WC  . dextrose  25 g Intravenous Once  . famotidine  20 mg Oral Daily  . heparin subcutaneous  5,000 Units Subcutaneous Q12H  . mouth rinse  15 mL Mouth Rinse BID  . meropenem (MERREM) IV  500 mg Intravenous Q24H  . venlafaxine  25 mg Oral BID WC   sodium chloride, acetaminophen, albuterol, metoprolol, ondansetron (ZOFRAN) IV  Assessment/ Plan:  49 y.o. black male with past medical history of end-stage renal disease on hemodialysis TTS, diabetes mellitus type 2, hypertension, history of CVA, anemia of chronic kidney disease, secondary hyperparathyroidism, resides at assisted living facility, peripheral vascular disease  [facility]/TTS  1.  ESRD on HD TTS: Patient does not want to continue dialysis at this time. He is alert and aware of his decision. Family is in agreement.   2. Hypertension: with tachycardia. History of difficult to control. At goal today.  - carvedilol  3. Anemia of chronic kidney disease. - hold epo since not getting dialysis  4. Secondary hyperparathyroidism.   - discontinue Sensipar as well as Renvela.    LOS: 2 Estes Lehner 9/11/20171:12 PM

## 2015-09-13 NOTE — Progress Notes (Signed)
Palliative Medicine consult noted. Due to high referral volume, there may be a delay seeing this patient. Please call the Palliative Medicine Team office at [phone] if recommendations are needed in the interim.  Thank you for inviting us to see this patient.  Margret ChanceMelanie G. Ashlyne Olenick, RN, BSN, [facility] 09/13/2015 2:37 PM Cell [phone] 8:00-4:00 Monday-Friday Office [phone]

## 2015-09-14 DIAGNOSIS — A4151 Sepsis due to Escherichia coli [E. coli]: Secondary | ICD-10-CM

## 2015-09-14 DIAGNOSIS — Z789 Other specified health status: Secondary | ICD-10-CM

## 2015-09-14 DIAGNOSIS — Z515 Encounter for palliative care: Secondary | ICD-10-CM

## 2015-09-14 DIAGNOSIS — Z66 Do not resuscitate: Secondary | ICD-10-CM

## 2015-09-14 DIAGNOSIS — N186 End stage renal disease: Secondary | ICD-10-CM

## 2015-09-14 LAB — CULTURE, BLOOD (ROUTINE X 2)

## 2015-09-14 MED ORDER — LORAZEPAM 1 MG PO TABS: 1.0000 mg | ORAL_TABLET | ORAL | 0 refills | Status: AC | PRN

## 2015-09-14 MED ORDER — MORPHINE SULFATE (CONCENTRATE) 10 MG/0.5ML PO SOLN: 5.0000 mg | ORAL | 0 refills | Status: AC | PRN

## 2015-09-14 MED ORDER — MORPHINE SULFATE (CONCENTRATE) 10 MG/0.5ML PO SOLN: 5.0000 mg | ORAL | Status: DC | PRN

## 2015-09-14 MED ORDER — LORAZEPAM 1 MG PO TABS: 1.0000 mg | ORAL_TABLET | ORAL | Status: DC | PRN

## 2015-09-14 NOTE — [place] FL2 (Signed)
[place] MEDICAID FL2 LEVEL OF CARE SCREENING TOOL     IDENTIFICATION  Patient Name: Devin Richmond Birthdate: 10-28-1965 Sex: male Admission Date (Current Location): 09/11/2015  [facility] and [place] Number:  Chiropodist and Address:  [facility], [address] [place], [place] [address]      Provider Number: [id number]  Attending Physician Name and Address:  Houston Siren, MD  Relative Name and Phone Number:       Current Level of Care: Hospital Recommended Level of Care: Skilled Nursing Facility Prior Approval Number:    Date Approved/Denied:   PASRR Number:    Discharge Plan: SNF (return with hospice services at facility)    Current Diagnoses: Patient Active Problem List   Diagnosis Date Noted  . Sepsis ([name]) 09/11/2015  . 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 Sinus tachycardia ([name]) 08/19/2014    Orientation RESPIRATION BLADDER Height & Weight     Self, Time, Situation, Place  Normal Incontinent Weight: 200 lb 6.4 oz (90.9 kg) Height:  6' (182.9 cm)  BEHAVIORAL SYMPTOMS/MOOD NEUROLOGICAL BOWEL NUTRITION STATUS      Incontinent Diet (regular/diabetic)  AMBULATORY STATUS COMMUNICATION OF NEEDS Skin   Extensive Assist Verbally Surgical wounds (Amputation left great toe.  Sutures were to be removed Friday and patient missed appointment.  Sutures are dry and embedded in skin today.  Will remove)                       Personal Care Assistance Level of Assistance  Total care Bathing Assistance: Limited assistance   Dressing Assistance: Limited assistance Total Care Assistance: Limited assistance   Functional Limitations Info  Sight, Hearing, Speech Sight Info: Adequate Hearing Info: Adequate Speech Info: Adequate    SPECIAL CARE FACTORS FREQUENCY                       Contractures Contractures Info: Not present    Additional Factors Info  Code Status, Allergies, Isolation Precautions Code Status Info:  DNR Allergies Info: Sulfa Antibiotics     Isolation Precautions Info: Extended spectrum beta lactamase     Current Medications (09/14/2015):  This is the current hospital active medication list Current Facility-Administered Medications  Medication Dose Route Frequency Provider Last Rate Last Dose  . 0.9 %  sodium chloride infusion  250 mL Intravenous PRN Shane Crutch, MD      . acetaminophen (TYLENOL) tablet 650 mg  650 mg Oral Q4H PRN Shane Crutch, MD   650 mg at 09/12/15 0432  . albuterol (PROVENTIL) (2.5 MG/3ML) 0.083% nebulizer solution 2.5 mg  2.5 mg Nebulization Q2H PRN Shane Crutch, MD      . dextrose 50 % solution 25 g  25 g Intravenous Once Shane Crutch, MD      . LORazepam (ATIVAN) tablet 1 mg  1 mg Oral Q4H PRN Canary Brim, NP      . MEDLINE mouth rinse  15 mL Mouth Rinse BID Shane Crutch, MD   15 mL at 09/11/15 2212  . meropenem (MERREM) 500 mg in sodium chloride 0.9 % 50 mL IVPB  500 mg Intravenous Q24H Srikar Sudini, MD      . morphine CONCENTRATE 10 MG/0.5ML oral solution 5 mg  5 mg Oral Q1H PRN Canary Brim, NP      . ondansetron [facility]) injection 4 mg  4 mg Intravenous Q6H PRN Shane Crutch, MD      . venlafaxine [facility])  tablet 25 mg  25 mg Oral BID WC Shane CrutchPradeep Ramachandran, MD   25 mg at 09/13/15 1016     Discharge Medications: Please see discharge summary for a list of discharge medications.  Relevant Imaging Results:  Relevant Lab Results:   Additional Information Regarding wound care:     Pressure Ulcer POA:N/A   Measurement: 12 cm suture line at great toe amputation site.    Dressing procedure/placement/frequency:Cleansed suture line with Betadine.  Removed 14 sutures.  Mepitel silicone contact layer placed and dry dressing, kerlix and tape.  Change Monday, Wednesday and Friday  Raye SorrowCoble, Lilac Hoff N, [place]

## 2015-09-14 NOTE — Progress Notes (Signed)
Patient daughter notified about EMS transfer.

## 2015-09-14 NOTE — Discharge Summary (Signed)
[employer] [facility] [place] [facility]   PATIENT NAME: Devin Richmond    MR#:  [id number]  DATE OF BIRTH:  Mar 12, 1965  DATE OF ADMISSION:  09/11/2015 ADMITTING PHYSICIAN: Shane Crutch, MD  DATE OF DISCHARGE: 09/14/2015  PRIMARY CARE PHYSICIAN: Singh,Jasmine, MD    ADMISSION DIAGNOSIS:  Dyspnea [R06.00] Fever [R50.9] Sepsis, due to unspecified organism ([name]) [A41.9]  DISCHARGE DIAGNOSIS:  Active Problems:   Sepsis ([name])   DNR (do not resuscitate)   Palliative care by specialist   End stage renal disease ([name])   Sepsis due to Escherichia coli ([name])   SECONDARY DIAGNOSIS:   Past Medical History:  Diagnosis Date  . Anemia   . CVA (cerebral infarction)   . Diabetes mellitus without complication ([name])   . Diabetic retinopathy ([name])   . Dialysis patient ([name])   . GERD (gastroesophageal reflux disease)   . Hyperlipidemia   . Hypertension   . Kidney disease   . 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 Pancreatitis   . Stroke [facility])     HOSPITAL COURSE:   50 yo male with past medical history of end-stage renal disease on hemodialysis, peripheral vascular disease, diabetes, diabetic retinopathy, diabetic neuropathy, GERD, chronic anemia, history of previous CVA who presented to the hospital due to sepsis.  1. Sepsis-this was secondary to urinary tract infection. Blood and urine cultures + for ESBL.  -Pt. Was treated with IV Meropenem while in the hospital and since now he is on comfort Care he is being discharged off abx.   2. ESBL urinary tract infection.  3. End-stage renal disease on hemodialysis  4. Essential hypertension  5. Depression 6. PVD 7. Chronic pancreatitis.  8. Hx of Previous CVA.   Pt. Was getting aggressive care in the hospital with IV abx, fluids, HD for the ESRD but did not want any further aggressive care and wants to be kept comfortable.  A palliative [facility] consult was obtained and they had a discussion with the pt. About goals of  care and he has decided to be COMFORT CARE ONLY.  He is being discharged to SNF w/ hospice services under comfort care.    DISCHARGE CONDITIONS:   Stable  CONSULTS OBTAINED:  Treatment Team:  Mady Haagensen, MD  DRUG ALLERGIES:   Allergies  Allergen Reactions  . Sulfa Antibiotics Other (See Comments)    unknown    DISCHARGE MEDICATIONS:     Medication List    STOP taking these medications   amoxicillin-clavulanate 500-125 MG tablet Commonly known as:  AUGMENTIN   atorvastatin 80 MG tablet Commonly known as:  LIPITOR   carvedilol 12.5 MG tablet Commonly known as:  COREG   chlorproMAZINE 10 MG tablet Commonly known as:  THORAZINE   cinacalcet 90 MG tablet Commonly known as:  SENSIPAR   clopidogrel 75 MG tablet Commonly known as:  PLAVIX   escitalopram 10 MG tablet Commonly known as:  LEXAPRO   folic acid 400 MCG tablet Commonly known as:  FOLVITE   insulin aspart 100 UNIT/ML injection Commonly known as:  novoLOG   insulin detemir 100 UNIT/ML injection Commonly known as:  LEVEMIR   lidocaine-prilocaine cream Commonly known as:  EMLA   loperamide 2 MG capsule Commonly known as:  IMODIUM   loratadine 10 MG tablet Commonly known as:  CLARITIN   meclizine 25 MG tablet Commonly known as:  ANTIVERT   mirtazapine 30 MG tablet Commonly known as:  REMERON   ranitidine 150 MG tablet Commonly  known as:  ZANTAC     TAKE these medications   LORazepam 1 MG tablet Commonly known as:  ATIVAN Take 1 tablet (1 mg total) by mouth every 4 (four) hours as needed for anxiety.   morphine CONCENTRATE 10 MG/0.5ML Soln concentrated solution Take 0.25 mLs (5 mg total) by mouth every hour as needed for moderate pain, severe pain or shortness of breath.         DISCHARGE INSTRUCTIONS:   DIET:  Regular diet  DISCHARGE CONDITION:  Stable  ACTIVITY:  Activity as tolerated  OXYGEN:  Home Oxygen: No.   Oxygen Delivery: room air  DISCHARGE LOCATION:   nursing home with Hospice services  If you experience worsening of your admission symptoms, develop shortness of breath, life threatening emergency, suicidal or homicidal thoughts you must seek medical attention immediately by calling 911 or calling your MD immediately  if symptoms less severe.  You Must read complete instructions/literature along with all the possible adverse reactions/side effects for all the Medicines you take and that have been prescribed to you. Take any new Medicines after you have completely understood and accpet all the possible adverse reactions/side effects.   Please note  You were cared for by a hospitalist during your hospital stay. If you have any questions about your discharge medications or the care you received while you were in the hospital after you are discharged, you can call the unit and asked to speak with the hospitalist on call if the hospitalist that took care of you is not available. Once you are discharged, your primary care physician will handle any further medical issues. Please note that NO REFILLS for any discharge medications will be authorized once you are discharged, as it is imperative that you return to your primary care physician (or establish a relationship with a primary care physician if you do not have one) for your aftercare needs so that they can reassess your need for medications and monitor your lab values.     Today   Pt. Is comfortable.  Denies any pain or discomfort.  No CP, Shortness of breath.   VITAL SIGNS:  Blood pressure (!) 155/94, pulse 94, temperature 98.4 F (36.9 C), temperature source Oral, resp. rate 20, height 6' (1.829 m), weight 90.9 kg (200 lb 6.4 oz), SpO2 98 %.  I/O:   Intake/Output Summary (Last 24 hours) at 09/14/15 1217 Last data filed at 09/14/15 1100  Gross per 24 hour  Intake              600 ml  Output                0 ml  Net              600 ml    PHYSICAL EXAMINATION:  GENERAL:  50  y.o.-year-old patient lying in the bed in no acute distress.  EYES: Pupils equal, round, reactive to light and accommodation. No scleral icterus. Extraocular muscles intact.  HEENT: Head atraumatic, normocephalic. Oropharynx and nasopharynx clear.  NECK:  Supple, no jugular venous distention. No thyroid enlargement, no tenderness.  LUNGS: Normal breath sounds bilaterally, no wheezing, rales,rhonchi. No use of accessory muscles of respiration.  CARDIOVASCULAR: S1, S2 normal. II/VI SEM at LSB, No rubs, or gallops.  ABDOMEN: Soft, non-tender, non-distended. Bowel sounds present. No organomegaly or mass.  EXTREMITIES: No pedal edema, cyanosis, or clubbing.  NEUROLOGIC: Cranial nerves II through XII are intact. No focal motor or sensory defecits b/l.  PSYCHIATRIC: The  patient is alert and oriented x 3. Good affect.  SKIN: No obvious rash, lesion, or ulcer.   Left upper ext. AV fistula with good bruit and thrill.   DATA REVIEW:   CBC  Recent Labs Lab 09/12/15 0710  WBC 11.4*  HGB 10.0*  HCT 30.1*  PLT 169    Chemistries   Recent Labs Lab 09/11/15 1644 09/12/15 0710  NA 134* 134*  K 3.8 3.5  CL 98* 99*  CO2 27 26  GLUCOSE 143* 63*  BUN 25* 31*  CREATININE 8.43* 9.48*  CALCIUM 8.6* 8.7*  MG  --  1.7  AST 42*  --   ALT 26  --   ALKPHOS 142*  --   BILITOT 0.9  --     Cardiac Enzymes  Recent Labs Lab 09/11/15 0737  TROPONINI 0.03*    Microbiology Results  Results for orders placed or performed during the hospital encounter of 09/11/15  Culture, blood (Routine x 2)     Status: Abnormal   Collection Time: 09/11/15  7:37 AM  Result Value Ref Range Status   Specimen Description BLOOD LEFT ANTECUBITAL  Final   Special Requests BOTTLES DRAWN AEROBIC AND ANAEROBIC  8CC  Final   Culture  Setup Time   Final    GRAM NEGATIVE RODS IN BOTH AEROBIC AND ANAEROBIC BOTTLES CRITICAL VALUE NOTED.  VALUE IS CONSISTENT WITH PREVIOUSLY REPORTED AND CALLED VALUE.    Culture (A)   Final    ESCHERICHIA COLI Confirmed Extended Spectrum Beta-Lactamase Producer (ESBL) Performed at [facility]    Report Status 09/14/2015 FINAL  Final   Organism ID, Bacteria ESCHERICHIA COLI  Final      Susceptibility   Escherichia coli - MIC*    AMPICILLIN >=32 RESISTANT Resistant     CEFAZOLIN >=64 RESISTANT Resistant     CEFEPIME 2 RESISTANT Resistant     CEFTAZIDIME 4 RESISTANT Resistant     CEFTRIAXONE >=64 RESISTANT Resistant     CIPROFLOXACIN >=4 RESISTANT Resistant     GENTAMICIN <=1 SENSITIVE Sensitive     IMIPENEM <=0.25 SENSITIVE Sensitive     TRIMETH/SULFA <=20 SENSITIVE Sensitive     AMPICILLIN/SULBACTAM 4 SENSITIVE Sensitive     PIP/TAZO <=4 SENSITIVE Sensitive     Extended ESBL POSITIVE Resistant     * ESCHERICHIA COLI  Culture, blood (Routine x 2)     Status: Abnormal   Collection Time: 09/11/15  7:37 AM  Result Value Ref Range Status   Specimen Description BLOOD LEFT ARM  Final   Special Requests   Final    BOTTLES DRAWN AEROBIC AND ANAEROBIC  AER 8CC ANA 4CC   Culture  Setup Time   Final    GRAM NEGATIVE RODS IN BOTH AEROBIC AND ANAEROBIC BOTTLES CRITICAL RESULT CALLED TO, READ BACK BY AND VERIFIED WITH: MATT MCBANE 09/11/15 AT 2200 BY HS    Culture (A)  Final    ESCHERICHIA COLI SUSCEPTIBILITIES PERFORMED ON PREVIOUS CULTURE WITHIN THE LAST 5 DAYS. Performed at [facility]    Report Status 09/14/2015 FINAL  Final  Blood Culture ID Panel (Reflexed)     Status: Abnormal   Collection Time: 09/11/15  7:37 AM  Result Value Ref Range Status   Enterococcus species NOT DETECTED NOT DETECTED Final   Listeria monocytogenes NOT DETECTED NOT DETECTED Final   Staphylococcus species NOT DETECTED NOT DETECTED Final   Staphylococcus aureus NOT DETECTED NOT DETECTED Final   Streptococcus species NOT DETECTED NOT DETECTED Final  Streptococcus agalactiae NOT DETECTED NOT DETECTED Final   Streptococcus pneumoniae NOT DETECTED NOT DETECTED Final    Streptococcus pyogenes NOT DETECTED NOT DETECTED Final   Acinetobacter baumannii NOT DETECTED NOT DETECTED Final   Enterobacteriaceae species DETECTED (A) NOT DETECTED Final    Comment: CRITICAL RESULT CALLED TO, READ BACK BY AND VERIFIED WITH: MATT MCBANE 09/11/15 AT 2200 BY HS    Enterobacter cloacae complex NOT DETECTED NOT DETECTED Final   Escherichia coli DETECTED (A) NOT DETECTED Final    Comment: CRITICAL RESULT CALLED TO, READ BACK BY AND VERIFIED WITH: MATT MCBANE 09/11/15 AT 2200 BY HS    Klebsiella oxytoca NOT DETECTED NOT DETECTED Final   Klebsiella pneumoniae NOT DETECTED NOT DETECTED Final   Proteus species NOT DETECTED NOT DETECTED Final   Serratia marcescens NOT DETECTED NOT DETECTED Final   Carbapenem resistance NOT DETECTED NOT DETECTED Final   Haemophilus influenzae NOT DETECTED NOT DETECTED Final   Neisseria meningitidis NOT DETECTED NOT DETECTED Final   Pseudomonas aeruginosa NOT DETECTED NOT DETECTED Final   Candida albicans NOT DETECTED NOT DETECTED Final   Candida glabrata NOT DETECTED NOT DETECTED Final   Candida krusei NOT DETECTED NOT DETECTED Final   Candida parapsilosis NOT DETECTED NOT DETECTED Final   Candida tropicalis NOT DETECTED NOT DETECTED Final  Urine culture     Status: Abnormal   Collection Time: 09/11/15  9:09 AM  Result Value Ref Range Status   Specimen Description URINE, RANDOM  Final   Special Requests NONE  Final   Culture (A)  Final    >=100,000 COLONIES/mL ESCHERICHIA COLI Confirmed Extended Spectrum Beta-Lactamase Producer (ESBL) Performed at [facility]    Report Status 09/13/2015 FINAL  Final   Organism ID, Bacteria ESCHERICHIA COLI (A)  Final      Susceptibility   Escherichia coli - MIC*    AMPICILLIN >=32 RESISTANT Resistant     CEFAZOLIN >=64 RESISTANT Resistant     CEFTRIAXONE >=64 RESISTANT Resistant     CIPROFLOXACIN >=4 RESISTANT Resistant     GENTAMICIN <=1 SENSITIVE Sensitive     IMIPENEM <=0.25 SENSITIVE  Sensitive     NITROFURANTOIN <=16 SENSITIVE Sensitive     TRIMETH/SULFA <=20 SENSITIVE Sensitive     AMPICILLIN/SULBACTAM 4 SENSITIVE Sensitive     PIP/TAZO <=4 SENSITIVE Sensitive     Extended ESBL POSITIVE Resistant     * >=100,000 COLONIES/mL ESCHERICHIA COLI  MRSA PCR Screening     Status: None   Collection Time: 09/11/15 11:28 AM  Result Value Ref Range Status   MRSA by PCR NEGATIVE NEGATIVE Final    Comment:        The GeneXpert MRSA Assay (FDA approved for NASAL specimens only), is one component of a comprehensive MRSA colonization surveillance program. It is not intended to diagnose MRSA infection nor to guide or monitor treatment for MRSA infections.     RADIOLOGY:  No results found.    Management plans discussed with the patient, family and they are in agreement.  CODE STATUS:     Code Status Orders        Start     Ordered   09/11/15 1442  Do not attempt resuscitation (DNR)  Continuous    Question Answer Comment  In the event of cardiac or respiratory ARREST Do not call a "code blue"   In the event of cardiac or respiratory ARREST Do not perform Intubation, CPR, defibrillation or ACLS   In the event of cardiac or respiratory  ARREST Use medication by any route, position, wound care, and other measures to relive pain and suffering. May use oxygen, suction and manual treatment of airway obstruction as needed for comfort.      09/11/15 1441    Code Status History    Date Active Date Inactive Code Status Order ID Comments User Context   09/11/2015 10:19 AM 09/11/2015  2:41 PM Full Code [id number]  Shane Crutch, MD ED   08/18/2015 12:36 AM 08/25/2015  5:21 PM Full Code [id number]  Enid Baas, MD Inpatient      TOTAL TIME TAKING CARE OF THIS PATIENT: 40 minutes.    Houston Siren M.D on 09/14/2015 at 12:17 PM  Between 7am to 6pm - Pager - [phone]  After 6pm go to [email or web address] - Social research officer, government  [employer] [facility]   Office  [phone]  CC: Primary care physician; Leotis Shames, MD

## 2015-09-14 NOTE — Consult Note (Signed)
Consultation Note Date: 09/14/2015   Patient Name: Devin Richmond  DOB: 09-06-1965  MRN: [id number]  Age / Sex: 50 y.o., male  PCP: Leotis ShamesJasmine Singh, MD Referring Physician: Houston SirenVivek J Sainani, MD  Reason for Consultation: Establishing goals of care, Non pain symptom management, Pain control and Psychosocial/spiritual support  HPI/Patient Profile: 50 y.o. male  admitted on 09/11/2015 with past medical history of end-stage renal disease on hemodialysis TTS, diabetes mellitus type 2, hypertension, history of CVA, anemia of chronic kidney disease, secondary hyperparathyroidism, resides at group home facility, peripheral vascular disease    ESRD on HD TTS: Patient does not want to continue dialysis at this time. He is alert and aware of his decision. Family is in agreement.    Patient is alert and oriented and verbalizes his desire to stop dialysis.  "I am tired and ready to go home".  He has discussed his decision with his pastor and "feels at peace" with his decision.  His family supports his decision, "he has suffered long enough"  He verbalizes his understand that by stopping dialysis he can expect to "die in about 7 days"    Clinical Assessment and Goals of Care:   This NP Lorinda CreedMary Cristian Grieves reviewed medical records, received report from team, assessed the patient and then meet at the patient's bedside   to discuss diagnosis, prognosis, GOC, EOL wishes disposition and options.  A discussion was had today regarding advanced directives.  Concepts specific to code status, artifical feeding and hydration, continued IV antibiotics and rehospitalization was had.  The difference between a aggressive medical intervention path  and a palliative comfort care path for this patient at this time was had.  Values and goals of care important to patient and family were attempted to be elicited.   Natural trajectory and  expectations at EOL were discussed.  Questions and concerns addressed.     PMT will continue to support holistically.  I then spoke by telephone with his sister and discussed above concepts   SUMMARY OF RECOMMENDATIONS    Code Status/Advance Care Planning:  DNR    Symptom Management:   Pain/Dyspnea: Roxanol 5 mg po/sl every 1 hr prn  Agitation: Ativan 1 mg PO/sl every 4 hrs prn  Palliative Prophylaxis:   Frequent Pain Assessment and Oral Care  Additional Recommendations (Limitations, Scope, Preferences):  Full Comfort Care   No dialysis  No rehospitalization  No artificial feeding or hydration   No antibiotics, no labs   Psycho-social/Spiritual:   Desire for further Chaplaincy support:strong community church support  Additional Recommendations: Education on Hospice  Prognosis:   < 2 weeks  Discharge Planning: Patient is requesting [facility] with hospice services in place      Primary Diagnoses: Present on Admission: . Sepsis ([name])   I have reviewed the medical record, interviewed the patient and family, and examined the patient. The following aspects are pertinent.  Past Medical History:  Diagnosis Date  . Anemia   . CVA (cerebral infarction)   . Diabetes mellitus without complication (  [name])   . Diabetic retinopathy ([name])   . Dialysis patient ([name])   . GERD (gastroesophageal reflux disease)   . Hyperlipidemia   . Hypertension   . Kidney disease   . 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 Pancreatitis   . Stroke [facility])    Social History   Social History  . Marital status: Divorced    Spouse name: N/A  . Number of children: N/A  . Years of education: N/A   Social History Main Topics  . Smoking status: Never Smoker  . Smokeless tobacco: Never Used  . Alcohol use No  . Drug use: No  . Sexual activity: Not Asked   Other Topics Concern  . None   Social History Narrative  . None   Family History  Problem Relation Age of Onset  . Stroke  Father    Scheduled Meds: . acidophilus  2 capsule Oral Daily  . carvedilol  3.125 mg Oral BID WC  . dextrose  25 g Intravenous Once  . famotidine  20 mg Oral Daily  . heparin subcutaneous  5,000 Units Subcutaneous Q12H  . mouth rinse  15 mL Mouth Rinse BID  . meropenem (MERREM) IV  500 mg Intravenous Q24H  . venlafaxine  25 mg Oral BID WC   Continuous Infusions:  PRN Meds:.sodium chloride, acetaminophen, albuterol, metoprolol, ondansetron (ZOFRAN) IV Medications Prior to Admission:  Prior to Admission medications   Medication Sig Start Date End Date Taking? Authorizing Provider  atorvastatin (LIPITOR) 80 MG tablet Take 80 mg by mouth at bedtime.    Yes Historical Provider, MD  carvedilol (COREG) 12.5 MG tablet Take 1 tablet (12.5 mg total) by mouth 2 (two) times daily. 08/19/14  Yes Vesta Mixer, MD  cinacalcet (SENSIPAR) 90 MG tablet Take 90 mg by mouth daily.   Yes Historical Provider, MD  clopidogrel (PLAVIX) 75 MG tablet Take 75 mg by mouth daily.   Yes Historical Provider, MD  folic acid (FOLVITE) 400 MCG tablet Take 400 mcg by mouth daily.    Yes Historical Provider, MD  insulin aspart (NOVOLOG) 100 UNIT/ML injection Inject 2-10 Units into the skin 3 (three) times daily before meals. iCorrection coverage: Moderate (average weight, post-op)  CBG < 70: implement hypoglycemia protocol  CBG 70 - 120: 0 units  CBG 121 - 150: 2 units  CBG 151 - 200: 3 units  CBG 201 - 250: 5 units  CBG 251 - 300: 8 units  CBG 301 - 350: 11 units  CBG 351 - 400: 15 units  CBG > 400 call MD and obtain STAT lab verification 08/23/15  Yes Sital Mody, MD  insulin detemir (LEVEMIR) 100 UNIT/ML injection Inject 0.08 mLs (8 Units total) into the skin at bedtime. 08/25/15  Yes Adrian Saran, MD  lidocaine-prilocaine (EMLA) cream Apply 1 application topically daily as needed (days for dialysis). Patient taking differently: Apply 1 application topically 3 (three) times a week. Prior to dialysis 08/25/15  Yes  Adrian Saran, MD  loperamide (IMODIUM) 2 MG capsule Take 2 mg by mouth as needed for diarrhea or loose stools.   Yes Historical Provider, MD  loratadine (CLARITIN) 10 MG tablet Take 10 mg by mouth daily.   Yes Historical Provider, MD  meclizine (ANTIVERT) 25 MG tablet Take 1 tablet (25 mg total) by mouth 2 (two) times daily. Patient taking differently: Take 25 mg by mouth 2 (two) times daily as needed for dizziness.  08/23/15  Yes Adrian Saran, MD  mirtazapine (  REMERON) 30 MG tablet Take 30 mg by mouth at bedtime.   Yes Historical Provider, MD  ranitidine (ZANTAC) 150 MG tablet Take 150 mg by mouth daily as needed for heartburn.   Yes Historical Provider, MD  amoxicillin-clavulanate (AUGMENTIN) 500-125 MG tablet Take 1 tablet (500 mg total) by mouth daily. Patient not taking: Reported on 09/13/2015 08/23/15   Adrian Saran, MD  chlorproMAZINE (THORAZINE) 10 MG tablet Take 1 tablet (10 mg total) by mouth 3 (three) times daily as needed for hiccoughs. Patient not taking: Reported on 09/13/2015 08/25/15   Adrian Saran, MD  escitalopram (LEXAPRO) 10 MG tablet Take 1 tablet (10 mg total) by mouth daily. Patient not taking: Reported on 09/13/2015 08/23/15   Adrian Saran, MD   Allergies  Allergen Reactions  . Sulfa Antibiotics Other (See Comments)    unknown   Review of Systems  Constitutional: Positive for fatigue.  Neurological: Positive for weakness.    Physical Exam  Vital Signs: BP (!) 155/94 (BP Location: Left Arm)   Pulse 94   Temp 98.4 F (36.9 C) (Oral)   Resp 20   Ht 6' (1.829 m)   Wt 90.9 kg (200 lb 6.4 oz)   SpO2 98%   BMI 27.18 kg/m  Pain Assessment: No/denies pain   Pain Score: 0-No pain   SpO2: SpO2: 98 % O2 Device:SpO2: 98 % O2 Flow Rate: .O2 Flow Rate (L/min): 2 L/min  IO: Intake/output summary:  Intake/Output Summary (Last 24 hours) at 09/14/15 0924 Last data filed at 09/14/15 0700  Gross per 24 hour  Intake              480 ml  Output                0 ml  Net               480 ml    LBM: Last BM Date: 09/13/15 Baseline Weight: Weight: 86.7 kg (191 lb 3.2 oz) Most recent weight: Weight: 90.9 kg (200 lb 6.4 oz)     Palliative Assessment/Data:  30%   Flowsheet Rows   Flowsheet Row Most Recent Value  Intake Tab  Referral Department  Hospitalist  Unit at Time of Referral  Med/Surg Unit  Palliative Care Primary Diagnosis  Nephrology  Date Notified  09/12/15  Palliative Care Type  New Palliative care  Reason for referral  Clarify Goals of Care  Date of Admission  09/11/15  # of days IP prior to Palliative referral  1  Clinical Assessment  Psychosocial & Spiritual Assessment  Palliative Care Outcomes    Discussed with Dr Cherlynn Kaiser  Time In: 0950 Time Out: 1050 Time Total: 60 min Greater than 50%  of this time was spent counseling and coordinating care related to the above assessment and plan.  Signed by: Lorinda Creed, NP   Please contact Palliative Medicine Team phone at [phone] for questions and concerns.  For individual provider: See Loretha Stapler

## 2015-09-14 NOTE — Progress Notes (Signed)
Report called to DamonNicole at [facility], concerns addressed. EMS called awaiting arrival. IV site removed.

## 2015-09-14 NOTE — Progress Notes (Signed)
[facility]  ROUNDING NOTE   Subjective:   Has met with palliative care this morning. Has decided to not continue dialysis. Denies any pain, shortness of breath or discomfort.    Objective:  Vital signs in last 24 hours:  Temp:  [98.1 F (36.7 C)-99 F (37.2 C)] 98.4 F (36.9 C) (09/12 0514) Pulse Rate:  [94-102] 94 (09/12 0514) Resp:  [16-20] 20 (09/12 0514) BP: (149-157)/(88-97) 155/94 (09/12 0514) SpO2:  [98 %-100 %] 98 % (09/12 0514) Weight:  [90.9 kg (200 lb 6.4 oz)] 90.9 kg (200 lb 6.4 oz) (09/12 0514)  Weight change: -1.542 kg (-3 lb 6.4 oz) Filed Weights   09/12/15 0437 09/13/15 0441 09/14/15 0514  Weight: 90.6 kg (199 lb 11.8 oz) 92.4 kg (203 lb 12.8 oz) 90.9 kg (200 lb 6.4 oz)    Intake/Output: I/O last 3 completed shifts: In: 960 [P.O.:960] Out: 0    Intake/Output this shift:  No intake/output data recorded.  Physical Exam: General: No acute distress  Head: Normocephalic, atraumatic. Moist oral mucosal membranes  Eyes: Anicteric  Neck: Supple, trachea midline  Lungs:  Clear to auscultation, normal effort  Heart: S1S2  no rubs  Abdomen:  Soft, nontender, BS present  Extremities: trace peripheral edema. 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  Neurologic: Nonfocal, moving all four extremities  Skin: No lesions  Access: LUE AVF, non tender, good bruit and thrill    Basic Metabolic Panel:  Recent Labs Lab 09/11/15 0737 09/11/15 1644 09/12/15 0710  NA 135 134* 134*  K 4.0 3.8 3.5  CL 91* 98* 99*  CO2 _0 GLUCOSE 176* 143* 63*  BUN 21* 25* 31*  CREATININE 7.84* 8.43* 9.48*  CALCIUM 9.2 8.6* 8.7*  MG 1.6*  --  1.7  PHOS 4.1  --  3.7    Liver Function Tests:  Recent Labs Lab 09/11/15 0737 09/11/15 1644  AST 43* 42*  ALT 19 26  ALKPHOS 136* 142*  BILITOT 0.9 0.9  PROT 8.7* 7.1  ALBUMIN 3.0* 2.6*   No results for input(s): LIPASE, AMYLASE in the last 168 hours. No results for input(s): AMMONIA in the last  168 hours.  CBC:  Recent Labs Lab 09/11/15 0737 09/11/15 1644 09/12/15 0710  WBC 9.2 15.7* 11.4*  NEUTROABS 8.3*  --   --   HGB 10.9* 9.6* 10.0*  HCT 33.0* 28.9* 30.1*  MCV 96.2 95.6 95.0  PLT 190 164 169    Cardiac Enzymes:  Recent Labs Lab 09/11/15 0737  TROPONINI 0.03*    BNP: Invalid input(s): POCBNP  CBG:  Recent Labs Lab 09/12/15 0346 09/12/15 0745 09/12/15 0903 09/12/15 1254 09/12/15 1704  GLUCAP 79 60* 91 73 221*    Microbiology: Results for orders placed or performed during the hospital encounter of 09/11/15  Culture, blood (Routine x 2)     Status: Abnormal   Collection Time: 09/11/15  7:37 AM  Result Value Ref Range Status   Specimen Description BLOOD LEFT ANTECUBITAL  Final   Special Requests BOTTLES DRAWN AEROBIC AND ANAEROBIC  8CC  Final   Culture  Setup Time   Final    GRAM NEGATIVE RODS IN BOTH AEROBIC AND ANAEROBIC BOTTLES CRITICAL VALUE NOTED.  VALUE IS CONSISTENT WITH PREVIOUSLY REPORTED AND CALLED VALUE.    Culture (A)  Final    ESCHERICHIA COLI Confirmed Extended Spectrum Beta-Lactamase Producer (ESBL) Performed at [facility]    Report Status 09/14/2015 FINAL  Final   Organism ID, Bacteria  ESCHERICHIA COLI  Final      Susceptibility   Escherichia coli - MIC*    AMPICILLIN >=32 RESISTANT Resistant     CEFAZOLIN >=64 RESISTANT Resistant     CEFEPIME 2 RESISTANT Resistant     CEFTAZIDIME 4 RESISTANT Resistant     CEFTRIAXONE >=64 RESISTANT Resistant     CIPROFLOXACIN >=4 RESISTANT Resistant     GENTAMICIN <=1 SENSITIVE Sensitive     IMIPENEM <=0.25 SENSITIVE Sensitive     TRIMETH/SULFA <=20 SENSITIVE Sensitive     AMPICILLIN/SULBACTAM 4 SENSITIVE Sensitive     PIP/TAZO <=4 SENSITIVE Sensitive     Extended ESBL POSITIVE Resistant     * ESCHERICHIA COLI  Culture, blood (Routine x 2)     Status: Abnormal   Collection Time: 09/11/15  7:37 AM  Result Value Ref Range Status   Specimen Description BLOOD LEFT ARM  Final    Special Requests   Final    BOTTLES DRAWN AEROBIC AND ANAEROBIC  AER 8CC ANA 4CC   Culture  Setup Time   Final    GRAM NEGATIVE RODS IN BOTH AEROBIC AND ANAEROBIC BOTTLES CRITICAL RESULT CALLED TO, READ BACK BY AND VERIFIED WITH: MATT MCBANE 09/11/15 AT 2200 BY HS    Culture (A)  Final    ESCHERICHIA COLI SUSCEPTIBILITIES PERFORMED ON PREVIOUS CULTURE WITHIN THE LAST 5 DAYS. Performed at [facility]    Report Status 09/14/2015 FINAL  Final  Blood Culture ID Panel (Reflexed)     Status: Abnormal   Collection Time: 09/11/15  7:37 AM  Result Value Ref Range Status   Enterococcus species NOT DETECTED NOT DETECTED Final   Listeria monocytogenes NOT DETECTED NOT DETECTED Final   Staphylococcus species NOT DETECTED NOT DETECTED Final   Staphylococcus aureus NOT DETECTED NOT DETECTED Final   Streptococcus species NOT DETECTED NOT DETECTED Final   Streptococcus agalactiae NOT DETECTED NOT DETECTED Final   Streptococcus pneumoniae NOT DETECTED NOT DETECTED Final   Streptococcus pyogenes NOT DETECTED NOT DETECTED Final   Acinetobacter baumannii NOT DETECTED NOT DETECTED Final   Enterobacteriaceae species DETECTED (A) NOT DETECTED Final    Comment: CRITICAL RESULT CALLED TO, READ BACK BY AND VERIFIED WITH: MATT MCBANE 09/11/15 AT 2200 BY HS    Enterobacter cloacae complex NOT DETECTED NOT DETECTED Final   Escherichia coli DETECTED (A) NOT DETECTED Final    Comment: CRITICAL RESULT CALLED TO, READ BACK BY AND VERIFIED WITH: MATT MCBANE 09/11/15 AT 2200 BY HS    Klebsiella oxytoca NOT DETECTED NOT DETECTED Final   Klebsiella pneumoniae NOT DETECTED NOT DETECTED Final   Proteus species NOT DETECTED NOT DETECTED Final   Serratia marcescens NOT DETECTED NOT DETECTED Final   Carbapenem resistance NOT DETECTED NOT DETECTED Final   Haemophilus influenzae NOT DETECTED NOT DETECTED Final   Neisseria meningitidis NOT DETECTED NOT DETECTED Final   Pseudomonas aeruginosa NOT DETECTED NOT  DETECTED Final   Candida albicans NOT DETECTED NOT DETECTED Final   Candida glabrata NOT DETECTED NOT DETECTED Final   Candida krusei NOT DETECTED NOT DETECTED Final   Candida parapsilosis NOT DETECTED NOT DETECTED Final   Candida tropicalis NOT DETECTED NOT DETECTED Final  Urine culture     Status: Abnormal   Collection Time: 09/11/15  9:09 AM  Result Value Ref Range Status   Specimen Description URINE, RANDOM  Final   Special Requests NONE  Final   Culture (A)  Final    >=100,000 COLONIES/mL ESCHERICHIA COLI Confirmed Extended Spectrum Beta-Lactamase  Producer (ESBL) Performed at [facility]    Report Status 09/13/2015 FINAL  Final   Organism ID, Bacteria ESCHERICHIA COLI (A)  Final      Susceptibility   Escherichia coli - MIC*    AMPICILLIN >=32 RESISTANT Resistant     CEFAZOLIN >=64 RESISTANT Resistant     CEFTRIAXONE >=64 RESISTANT Resistant     CIPROFLOXACIN >=4 RESISTANT Resistant     GENTAMICIN <=1 SENSITIVE Sensitive     IMIPENEM <=0.25 SENSITIVE Sensitive     NITROFURANTOIN <=16 SENSITIVE Sensitive     TRIMETH/SULFA <=20 SENSITIVE Sensitive     AMPICILLIN/SULBACTAM 4 SENSITIVE Sensitive     PIP/TAZO <=4 SENSITIVE Sensitive     Extended ESBL POSITIVE Resistant     * >=100,000 COLONIES/mL ESCHERICHIA COLI  MRSA PCR Screening     Status: None   Collection Time: 09/11/15 11:28 AM  Result Value Ref Range Status   MRSA by PCR NEGATIVE NEGATIVE Final    Comment:        The GeneXpert MRSA Assay (FDA approved for NASAL specimens only), is one component of a comprehensive MRSA colonization surveillance program. It is not intended to diagnose MRSA infection nor to guide or monitor treatment for MRSA infections.     Coagulation Studies: No results for input(s): LABPROT, INR in the last 72 hours.  Urinalysis:  Recent Labs  09/11/15 1802  COLORURINE YELLOW*  LABSPEC 1.007  PHURINE 9.0*  GLUCOSEU >500*  HGBUR 3+*  BILIRUBINUR NEGATIVE  KETONESUR  NEGATIVE  PROTEINUR 100*  NITRITE NEGATIVE  LEUKOCYTESUR 3+*      Imaging: No results found.   Medications:     . dextrose  25 g Intravenous Once  . mouth rinse  15 mL Mouth Rinse BID  . meropenem (MERREM) IV  500 mg Intravenous Q24H  . venlafaxine  25 mg Oral BID WC   sodium chloride, acetaminophen, albuterol, LORazepam, morphine CONCENTRATE, ondansetron (ZOFRAN) IV  Assessment/ Plan:  50 y.o. black male with past medical history of end-stage renal disease on hemodialysis TTS, diabetes mellitus type 2, hypertension, history of CVA, anemia of chronic kidney disease, secondary hyperparathyroidism, resides at assisted living facility, peripheral vascular disease  [facility]/TTS  1.  ESRD on HD TTS: Patient does not want to continue dialysis at this time. He is alert and aware of his decision. Family is in agreement.    2. Hypertension: with tachycardia. History of difficult to control. At goal today.  - carvedilol  3. Anemia of chronic kidney disease. - hold epo since not getting dialysis  4. Secondary hyperparathyroidism.   - discontinued Sensipar as well as Renvela.    LOS: 3 ,  9/12/20179:38 AM   

## 2015-09-14 NOTE — Progress Notes (Signed)
New referral for Hospice and Palliative care services at [facility] following discharge received from [place]. [name] is a 50 year old man with a known history of ESRD, on Tues, Thus, Sat dialysis admitted to [facility] on 9/10 for evaluation and treatment of dehydration/sepsis. He has been treated with IV antibiotics for sepsis and has declined dialysis.  PMH includes CVA's, DM II, anemia, GERD, HLD, Diabetic retinopathy, peripheral neuropathy and pancreatitis.  [name] and his sister Devin Richmond met today with Palliative Medicine NP Wadie Lessen. Patient has chosen to stop dialysis and focus on his comfort with hospice services at [facility]. Writer met in the room with patient and his sister Devin Richmond to initiate education regarding hospice services, philosophy and team approach to care with good understanding voiced. Patient is alert and oriented and very clearly states his desire to stop dialysis. Patient information faxed to hospice referral. Hospital care team aware of and in agreement with plan for discharge via EMS today back to [facility]. Thank you. Flo Shanks RN, BSN, [place] [facility] [place], [facility] [phone]

## 2015-09-14 NOTE — Progress Notes (Signed)
EMS arrived to pick up patient. Patient concerned about blue bag, no bags found in room with patient by staff.

## 2015-09-14 NOTE — Progress Notes (Addendum)
Disposition:  Return to [facility] with Hospice Services   LCSW received update from NP with pallative reflecting disposition of patient and family wanting to return to SNF.  [facility] and hospice agency in which is contracted.   LCSW updated SNF regarding plan of not continuing diaylsis and use of hospice in facility.  All in agreement. FL2 updated reflecting plan of care at SNF. Case Manager updated regarding hospice agency to use and begin referral/set up.. Call placed to Patient sister [name] and voice mail left to return call to give preference for agency  [phone]  10:33 AM Call returned from sister [name] with regards to plans and preference for Hospice at facility. She reports she has her mother taken care of with [facility] and would prefer that agency.  LCSW contacted represenative Clydie BraunKaren and updated her regarding referral. Sister is hopeful for discharge today as she is from [place] and needs to return home, get more stuff and come back to be with patient.  MD updated regarding plan.  Patient made aware of discharge. He will require an EMS transfer back to facility.  If DC today, will arrange for transport and ensure DNR.  Devin EmoryHannah Greta Yung LCSW, MSW Coverage for [employer]

## 2015-09-18 LAB — BLOOD GAS, ARTERIAL
ACID-BASE EXCESS: 5.8 mmol/L — AB (ref 0.0–2.0)
BICARBONATE: 29.8 mmol/L — AB (ref 20.0–28.0)
FIO2: 0.28
O2 Saturation: 97.2 %
PH ART: 7.48 — AB (ref 7.350–7.450)
PO2 ART: 86 mmHg (ref 83.0–108.0)
Patient temperature: 37
pCO2 arterial: 40 mmHg (ref 32.0–48.0)

## 2015-10-05 IMAGING — CT CT HEAD W/O CM
1 series · 16 of 30 positions shown, 20 images · non-contrast
Comparison: 04/14/2014

CLINICAL DATA: Not feeling well, memory problems, on dialysis

EXAM:
CT HEAD WITHOUT CONTRAST
TECHNIQUE: Contiguous axial images were obtained from the base of the skull
through the vertex without intravenous contrast.

[Series 2: head wo · axial · 0.42mm/px · z∈[+1064,+1208]mm · 16 of 36 slices shown, 20 images]
[im 2/36  brain]
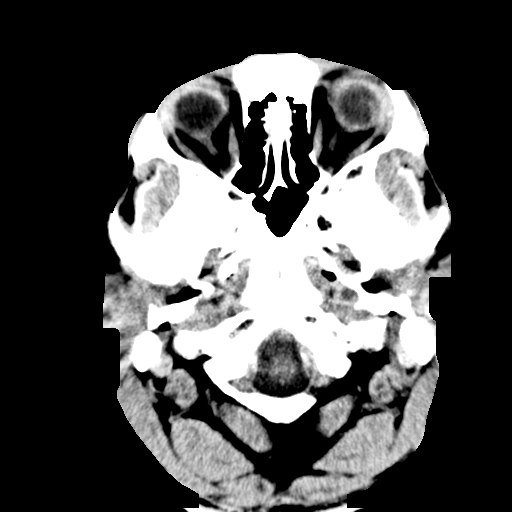
[im 2/36  bone]
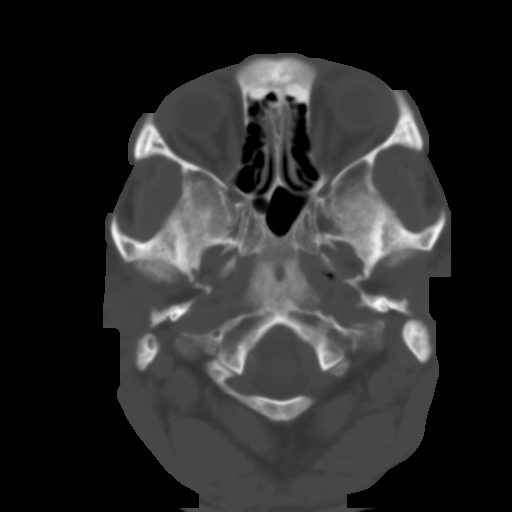
[im 4/36  brain]
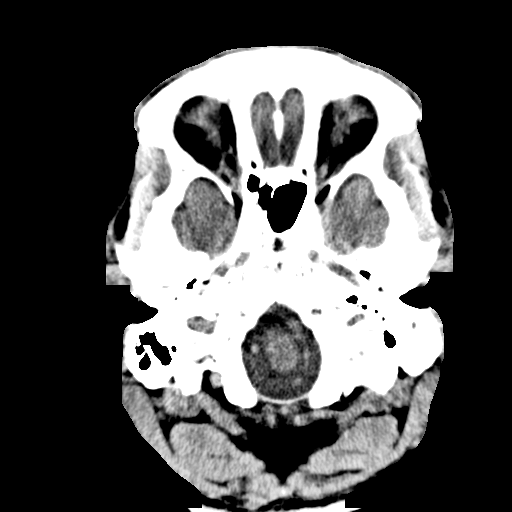
[im 7/36  brain]
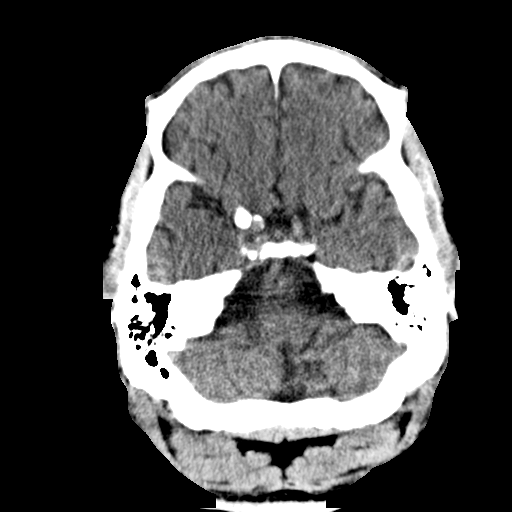
[im 9/36  brain]
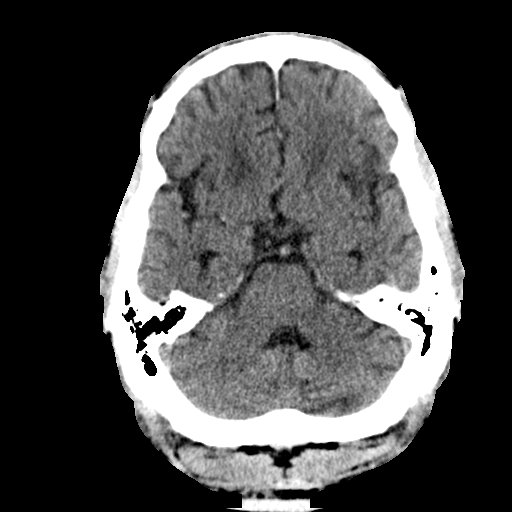
[im 10/36  brain]
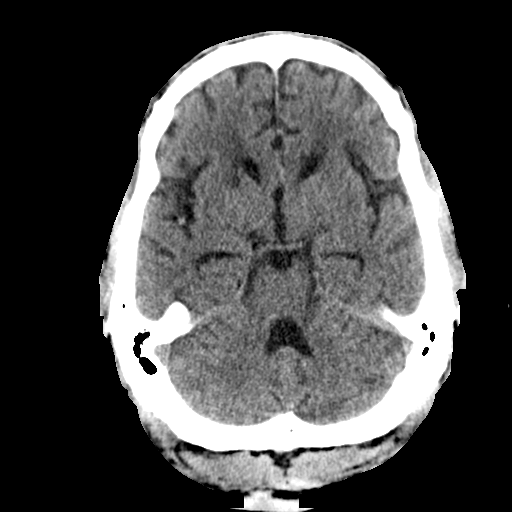
[im 10/36  bone]
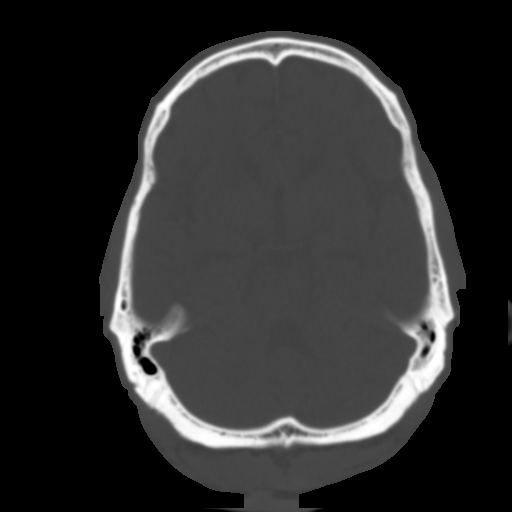
[im 13/36  brain]
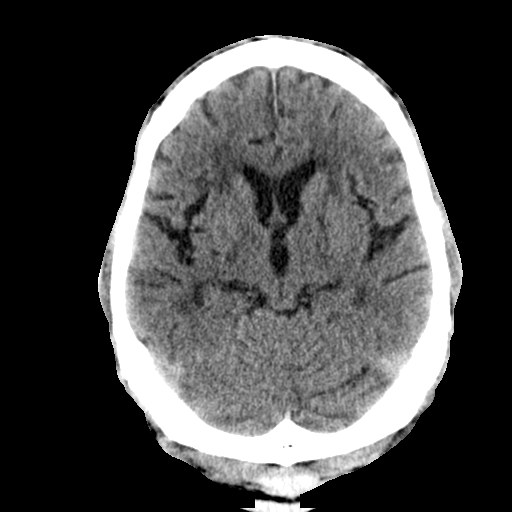
[im 15/36  brain]
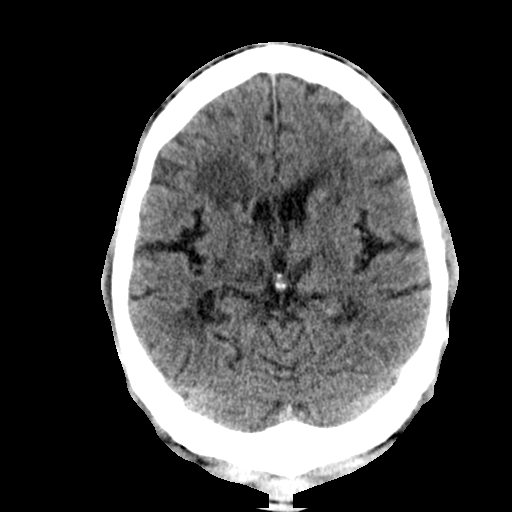
[im 17/36  brain]
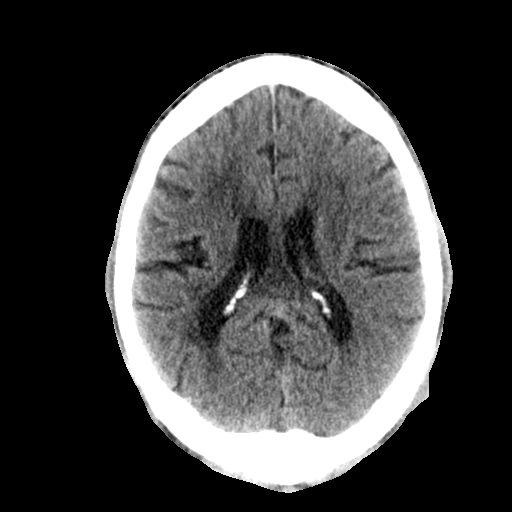
[im 19/36  brain]
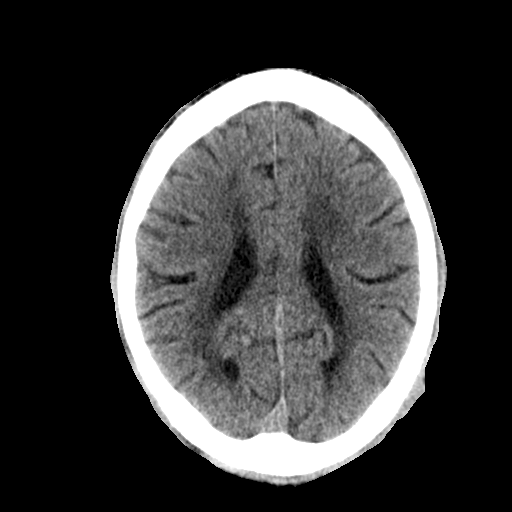
[im 19/36  bone]
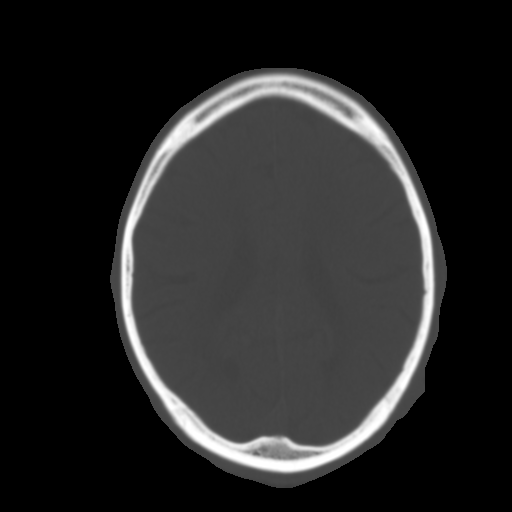
[im 21/36  brain]
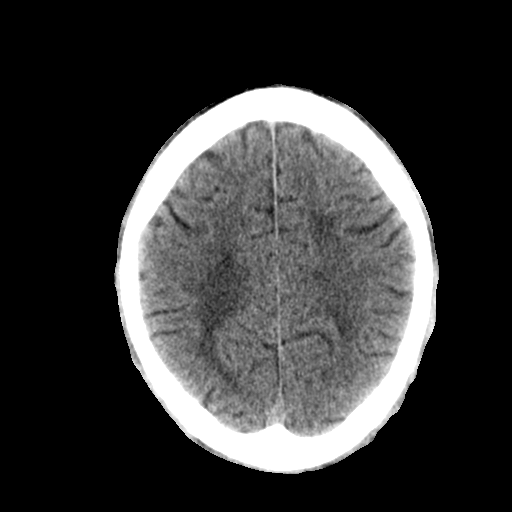
[im 23/36  brain]
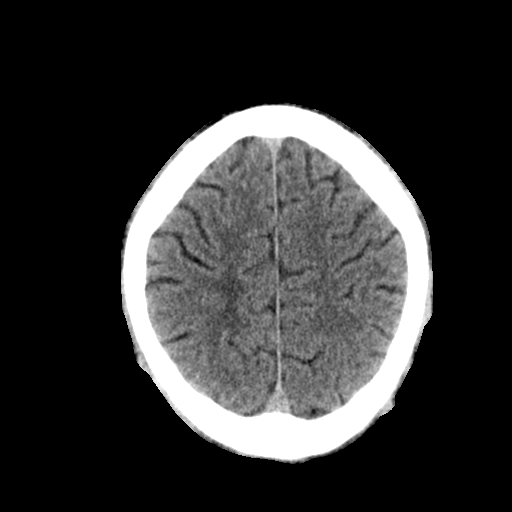
[im 26/36  brain]
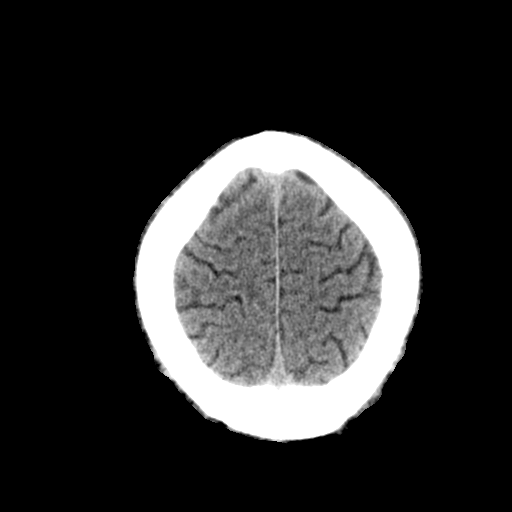
[im 27/36  brain]
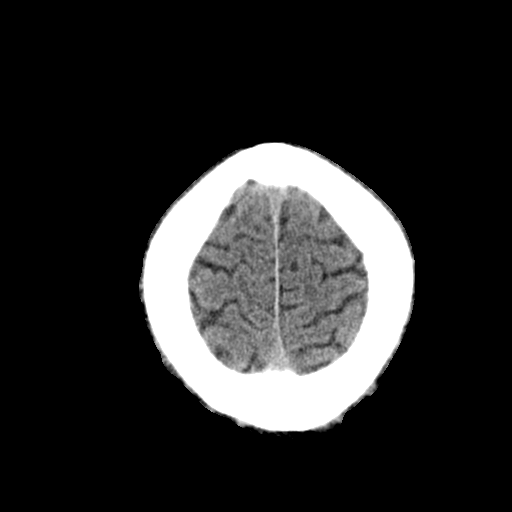
[im 27/36  bone]
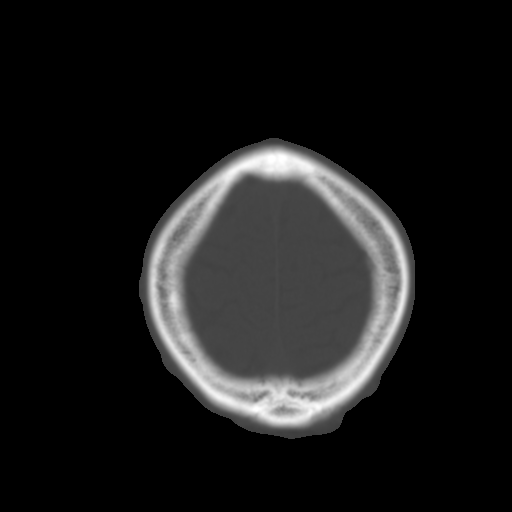
[im 29/36  brain]
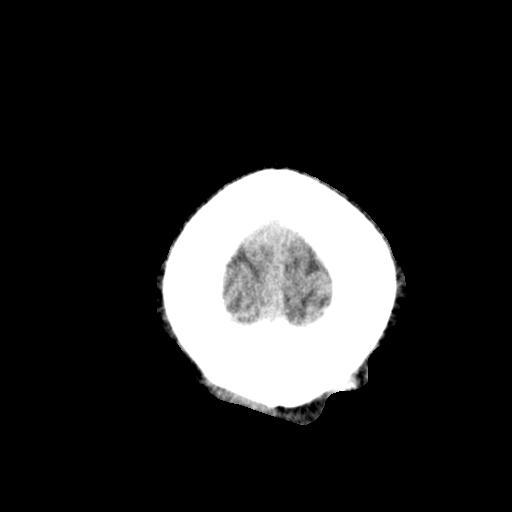
[im 32/36  brain]
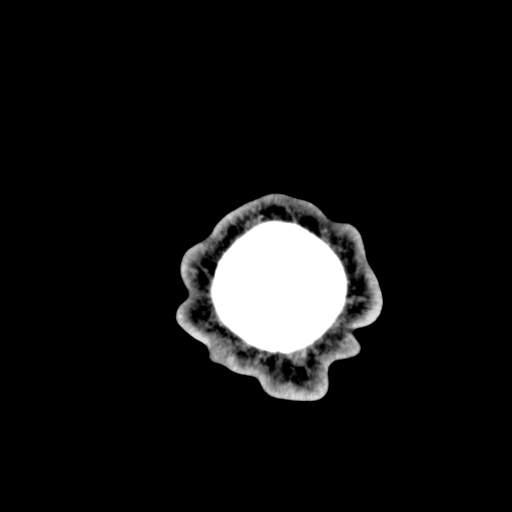
[im 34/36  brain]
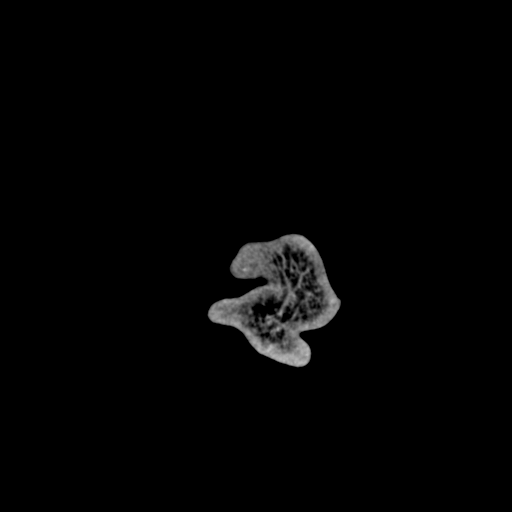

[16 of 30 positions shown; findings below may reference images not displayed]

FINDINGS: No skull fracture is noted. Paranasal sinuses and mastoid air cells
are unremarkable. No intracranial hemorrhage, mass effect or midline
shift.

Again noted atrophy and extensive chronic white matter disease.
Bilateral multiple lacunar infarcts are again noted. No definite
evidence of acute cortical infarction. No mass lesion is noted on
this unenhanced scan. Stable chronic small infarct in left
cerebellum.
IMPRESSION: No acute intracranial abnormality. Stable atrophy and chronic white
matter disease. Stable chronic findings as described above.

## 2015-11-03 DEATH — deceased

## 2015-11-10 ENCOUNTER — Encounter (INDEPENDENT_AMBULATORY_CARE_PROVIDER_SITE_OTHER): Payer: Self-pay

## 2015-11-10 ENCOUNTER — Encounter (INDEPENDENT_AMBULATORY_CARE_PROVIDER_SITE_OTHER): Payer: Medicare Other

## 2015-11-10 ENCOUNTER — Ambulatory Visit (INDEPENDENT_AMBULATORY_CARE_PROVIDER_SITE_OTHER): Payer: Self-pay | Admitting: Vascular Surgery

## 2017-02-08 IMAGING — DX DG CHEST 1V PORT
1 series · 1 of 1 positions shown · non-contrast
Comparison: Chest radiograph 08/23/2015

CLINICAL DATA: Patient with altered mental status, fever and
tachycardia.

EXAM:
PORTABLE CHEST 1 VIEW

[chest ap]
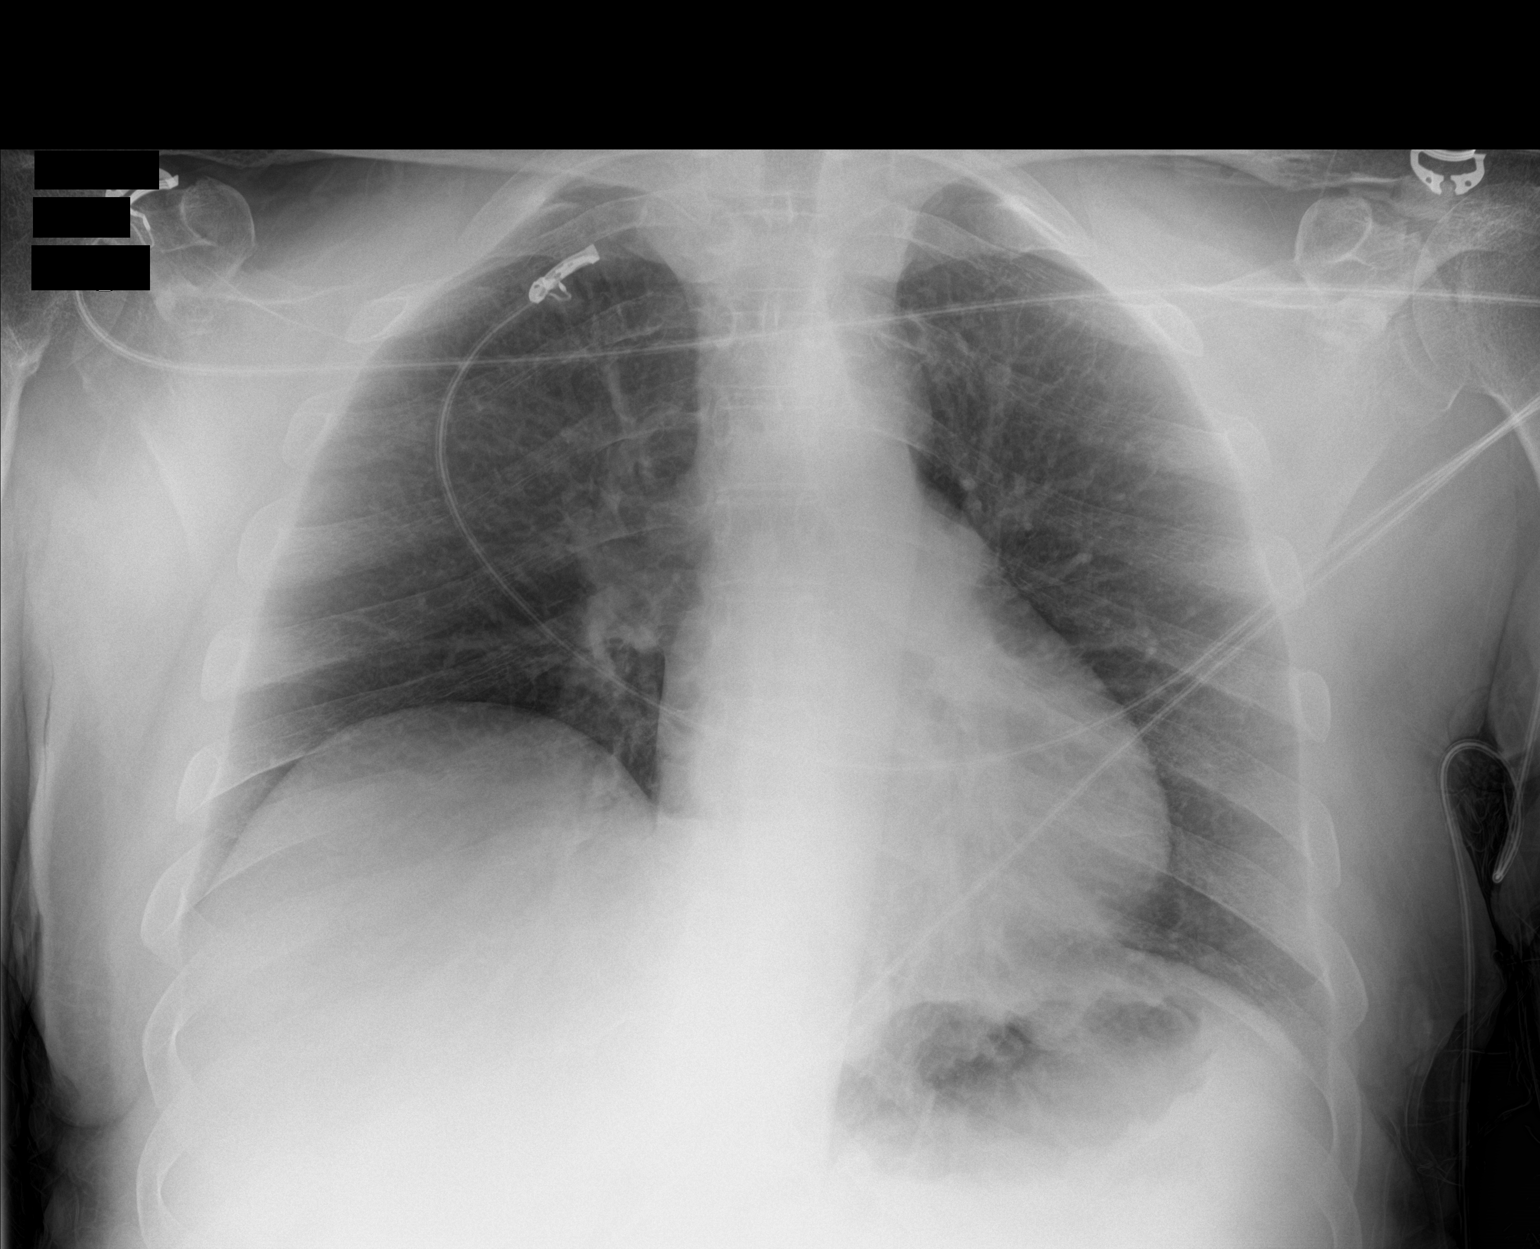

[1 of 1 positions shown; findings below may reference images not displayed]

FINDINGS: Monitoring leads overlie the patient. Stable cardiac and mediastinal
contours. Elevation of the right hemidiaphragm. No consolidative
pulmonary opacities. No pleural effusion or pneumothorax.
IMPRESSION: Elevation right hemidiaphragm.  No acute cardiopulmonary process.

## 2017-02-09 IMAGING — DX DG CHEST 1V PORT
1 series · 1 of 1 positions shown · non-contrast
Comparison: Chest radiograph performed 09/11/2015

CLINICAL DATA: Acute onset of dyspnea.  Sepsis.  Initial encounter.

EXAM:
PORTABLE CHEST 1 VIEW

[chest ap]
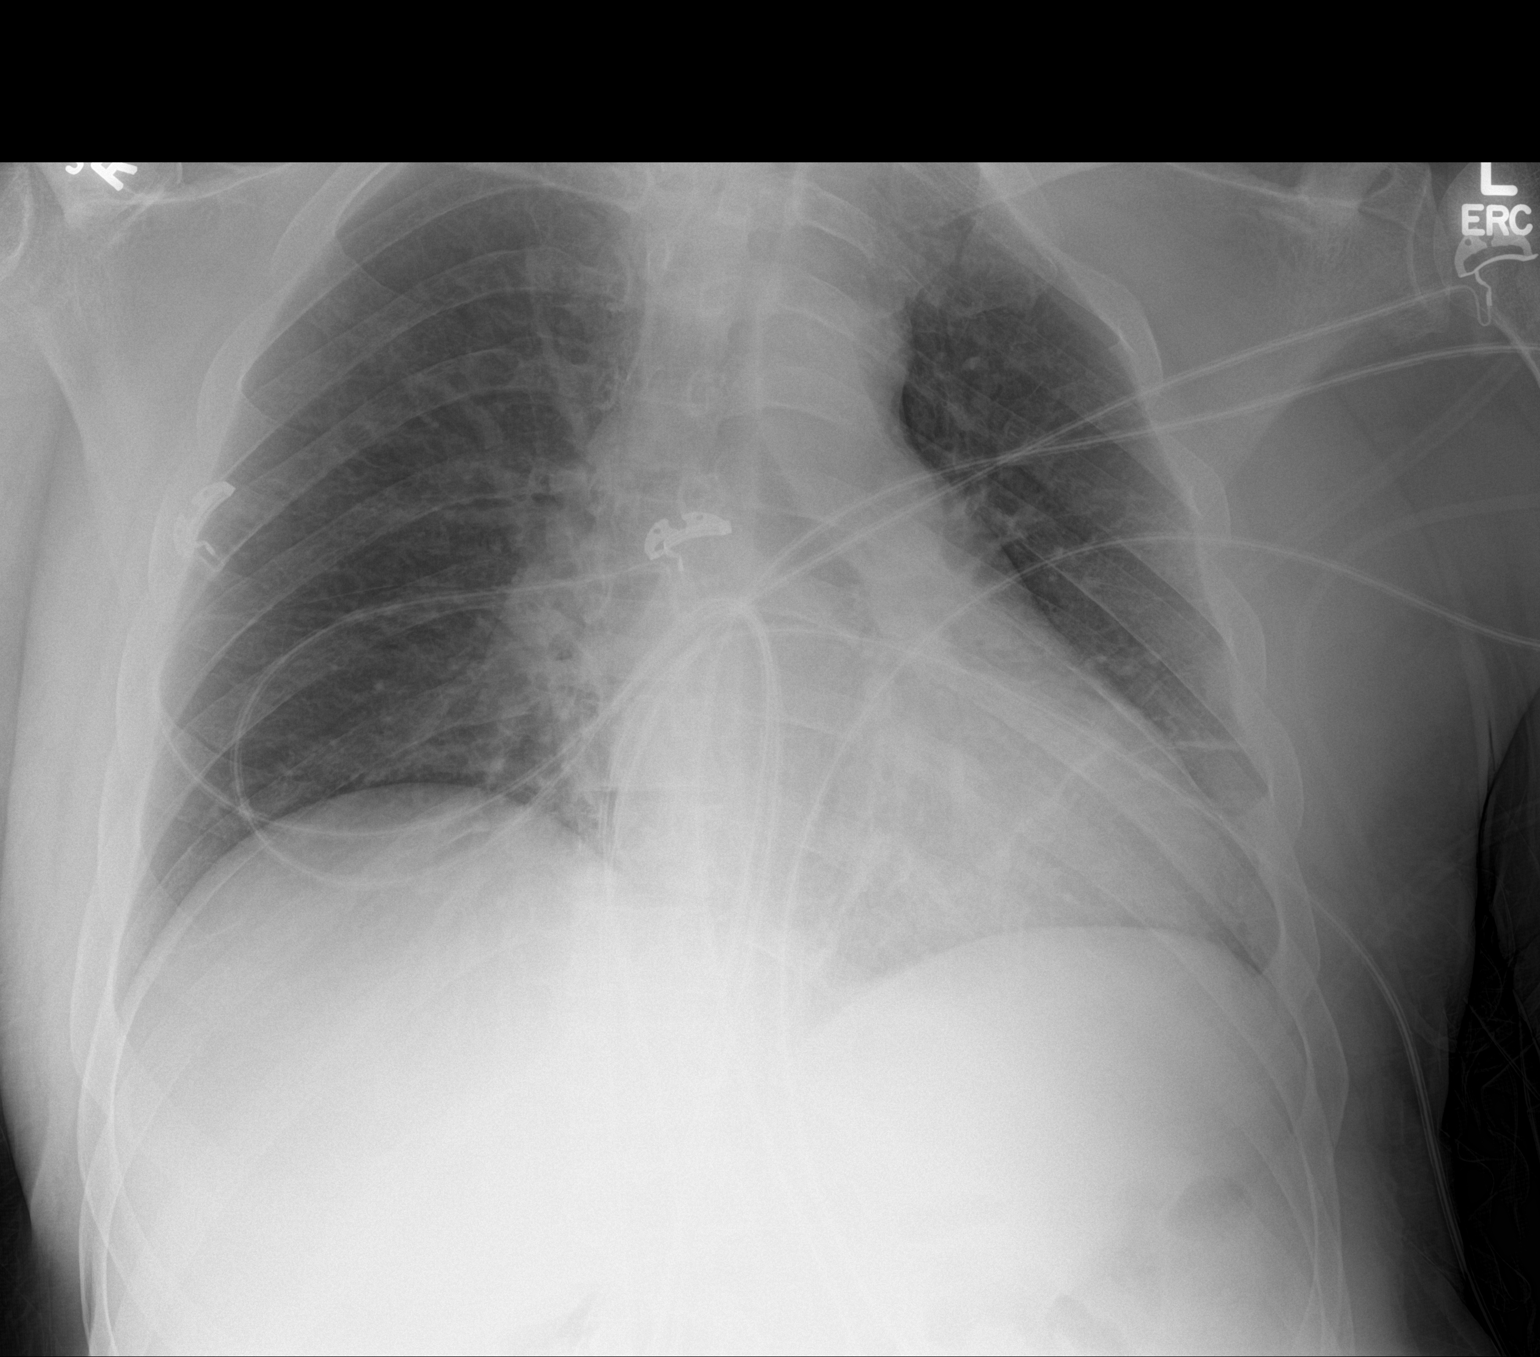

[1 of 1 positions shown; findings below may reference images not displayed]

FINDINGS: The lungs are well-aerated. Pulmonary vascularity is at the upper
limits of normal. Mild left basilar atelectasis is noted. There is
no evidence of pleural effusion or pneumothorax.

The cardiomediastinal silhouette is borderline normal in size. No
acute osseous abnormalities are seen.
IMPRESSION: Mild left basilar atelectasis noted.  Lungs otherwise clear.
